# Patient Record
Sex: Female | Born: 1975
Health system: Southern US, Community
[De-identification: ages and names within clinical notes are randomized; demographics above are authoritative.]

## PROBLEM LIST (undated history)

## (undated) DIAGNOSIS — J449 Chronic obstructive pulmonary disease, unspecified: Secondary | ICD-10-CM

## (undated) DIAGNOSIS — G8929 Other chronic pain: Secondary | ICD-10-CM

## (undated) DIAGNOSIS — E7800 Pure hypercholesterolemia, unspecified: Secondary | ICD-10-CM

## (undated) DIAGNOSIS — F319 Bipolar disorder, unspecified: Secondary | ICD-10-CM

## (undated) DIAGNOSIS — M5430 Sciatica, unspecified side: Secondary | ICD-10-CM

## (undated) DIAGNOSIS — M797 Fibromyalgia: Secondary | ICD-10-CM

## (undated) DIAGNOSIS — M549 Dorsalgia, unspecified: Secondary | ICD-10-CM

## (undated) DIAGNOSIS — F419 Anxiety disorder, unspecified: Secondary | ICD-10-CM

## (undated) DIAGNOSIS — F32A Depression, unspecified: Secondary | ICD-10-CM

## (undated) DIAGNOSIS — R569 Unspecified convulsions: Secondary | ICD-10-CM

## (undated) DIAGNOSIS — F329 Major depressive disorder, single episode, unspecified: Secondary | ICD-10-CM

## (undated) DIAGNOSIS — E119 Type 2 diabetes mellitus without complications: Secondary | ICD-10-CM

## (undated) DIAGNOSIS — M25559 Pain in unspecified hip: Secondary | ICD-10-CM

## (undated) DIAGNOSIS — I10 Essential (primary) hypertension: Secondary | ICD-10-CM

## (undated) DIAGNOSIS — I639 Cerebral infarction, unspecified: Secondary | ICD-10-CM

## (undated) DIAGNOSIS — J189 Pneumonia, unspecified organism: Secondary | ICD-10-CM

## (undated) DIAGNOSIS — M542 Cervicalgia: Secondary | ICD-10-CM

## (undated) DIAGNOSIS — F909 Attention-deficit hyperactivity disorder, unspecified type: Secondary | ICD-10-CM

## (undated) DIAGNOSIS — M1990 Unspecified osteoarthritis, unspecified site: Secondary | ICD-10-CM

## (undated) DIAGNOSIS — IMO0002 Reserved for concepts with insufficient information to code with codable children: Secondary | ICD-10-CM

## (undated) DIAGNOSIS — R51 Headache: Secondary | ICD-10-CM

## (undated) DIAGNOSIS — F4310 Post-traumatic stress disorder, unspecified: Secondary | ICD-10-CM

## (undated) HISTORY — PX: OTHER SURGICAL HISTORY: SHX169

## (undated) HISTORY — PX: ABDOMINAL HYSTERECTOMY: SHX81

## (undated) HISTORY — PX: ANTERIOR CRUCIATE LIGAMENT REPAIR: SHX115

## (undated) HISTORY — DX: Post-traumatic stress disorder, unspecified: F43.10

## (undated) HISTORY — PX: LEG SURGERY: SHX1003

## (undated) HISTORY — DX: Reserved for concepts with insufficient information to code with codable children: IMO0002

## (undated) HISTORY — DX: Sciatica, unspecified side: M54.30

## (undated) HISTORY — DX: Depression, unspecified: F32.A

## (undated) HISTORY — DX: Anxiety disorder, unspecified: F41.9

## (undated) HISTORY — DX: Major depressive disorder, single episode, unspecified: F32.9

---

## 1997-06-03 ENCOUNTER — Emergency Department (HOSPITAL_COMMUNITY): Admission: EM | Admit: 1997-06-03 | Discharge: 1997-06-03 | Payer: Self-pay | Admitting: Emergency Medicine

## 1997-10-26 ENCOUNTER — Emergency Department (HOSPITAL_COMMUNITY): Admission: EM | Admit: 1997-10-26 | Discharge: 1997-10-26 | Payer: Self-pay | Admitting: Emergency Medicine

## 1997-12-20 ENCOUNTER — Inpatient Hospital Stay (HOSPITAL_COMMUNITY): Admission: EM | Admit: 1997-12-20 | Discharge: 1997-12-21 | Payer: Self-pay | Admitting: Emergency Medicine

## 1998-03-16 ENCOUNTER — Inpatient Hospital Stay (HOSPITAL_COMMUNITY): Admission: AD | Admit: 1998-03-16 | Discharge: 1998-03-16 | Payer: Self-pay | Admitting: Obstetrics

## 1998-03-17 ENCOUNTER — Inpatient Hospital Stay (HOSPITAL_COMMUNITY): Admission: AD | Admit: 1998-03-17 | Discharge: 1998-03-17 | Payer: Self-pay | Admitting: *Deleted

## 1998-04-11 ENCOUNTER — Emergency Department (HOSPITAL_COMMUNITY): Admission: EM | Admit: 1998-04-11 | Discharge: 1998-04-11 | Payer: Self-pay | Admitting: Emergency Medicine

## 1998-06-27 ENCOUNTER — Inpatient Hospital Stay (HOSPITAL_COMMUNITY): Admission: AD | Admit: 1998-06-27 | Discharge: 1998-06-27 | Payer: Self-pay | Admitting: Obstetrics

## 1998-08-14 ENCOUNTER — Inpatient Hospital Stay (HOSPITAL_COMMUNITY): Admission: AD | Admit: 1998-08-14 | Discharge: 1998-08-19 | Payer: Self-pay | Admitting: *Deleted

## 1998-08-17 ENCOUNTER — Encounter: Payer: Self-pay | Admitting: *Deleted

## 1999-12-02 ENCOUNTER — Emergency Department (HOSPITAL_COMMUNITY): Admission: EM | Admit: 1999-12-02 | Discharge: 1999-12-02 | Payer: Self-pay | Admitting: Emergency Medicine

## 2000-05-27 ENCOUNTER — Emergency Department (HOSPITAL_COMMUNITY): Admission: EM | Admit: 2000-05-27 | Discharge: 2000-05-27 | Payer: Self-pay | Admitting: Emergency Medicine

## 2000-05-27 ENCOUNTER — Encounter: Payer: Self-pay | Admitting: Emergency Medicine

## 2001-04-22 ENCOUNTER — Emergency Department (HOSPITAL_COMMUNITY): Admission: EM | Admit: 2001-04-22 | Discharge: 2001-04-22 | Payer: Self-pay | Admitting: Emergency Medicine

## 2001-06-02 ENCOUNTER — Encounter: Payer: Self-pay | Admitting: Emergency Medicine

## 2001-06-02 ENCOUNTER — Emergency Department (HOSPITAL_COMMUNITY): Admission: EM | Admit: 2001-06-02 | Discharge: 2001-06-02 | Payer: Self-pay | Admitting: Emergency Medicine

## 2001-06-02 ENCOUNTER — Ambulatory Visit (HOSPITAL_COMMUNITY): Admission: RE | Admit: 2001-06-02 | Discharge: 2001-06-02 | Payer: Self-pay | Admitting: Pediatrics

## 2001-07-12 ENCOUNTER — Encounter: Payer: Self-pay | Admitting: Emergency Medicine

## 2001-07-12 ENCOUNTER — Emergency Department (HOSPITAL_COMMUNITY): Admission: EM | Admit: 2001-07-12 | Discharge: 2001-07-12 | Payer: Self-pay | Admitting: Emergency Medicine

## 2001-07-13 ENCOUNTER — Emergency Department (HOSPITAL_COMMUNITY): Admission: EM | Admit: 2001-07-13 | Discharge: 2001-07-13 | Payer: Self-pay | Admitting: *Deleted

## 2001-08-10 ENCOUNTER — Inpatient Hospital Stay (HOSPITAL_COMMUNITY): Admission: EM | Admit: 2001-08-10 | Discharge: 2001-08-16 | Payer: Self-pay | Admitting: Psychiatry

## 2001-09-12 ENCOUNTER — Encounter: Payer: Self-pay | Admitting: *Deleted

## 2001-09-12 ENCOUNTER — Emergency Department (HOSPITAL_COMMUNITY): Admission: EM | Admit: 2001-09-12 | Discharge: 2001-09-12 | Payer: Self-pay | Admitting: *Deleted

## 2001-09-26 ENCOUNTER — Encounter: Payer: Self-pay | Admitting: *Deleted

## 2001-09-26 ENCOUNTER — Emergency Department (HOSPITAL_COMMUNITY): Admission: EM | Admit: 2001-09-26 | Discharge: 2001-09-26 | Payer: Self-pay | Admitting: *Deleted

## 2001-10-06 ENCOUNTER — Encounter: Payer: Self-pay | Admitting: *Deleted

## 2001-10-06 ENCOUNTER — Emergency Department (HOSPITAL_COMMUNITY): Admission: EM | Admit: 2001-10-06 | Discharge: 2001-10-06 | Payer: Self-pay | Admitting: *Deleted

## 2001-12-26 ENCOUNTER — Emergency Department (HOSPITAL_COMMUNITY): Admission: EM | Admit: 2001-12-26 | Discharge: 2001-12-26 | Payer: Self-pay | Admitting: *Deleted

## 2001-12-26 ENCOUNTER — Encounter: Payer: Self-pay | Admitting: *Deleted

## 2002-01-01 ENCOUNTER — Inpatient Hospital Stay (HOSPITAL_COMMUNITY): Admission: EM | Admit: 2002-01-01 | Discharge: 2002-01-03 | Payer: Self-pay | Admitting: Psychiatry

## 2002-01-18 ENCOUNTER — Emergency Department (HOSPITAL_COMMUNITY): Admission: EM | Admit: 2002-01-18 | Discharge: 2002-01-18 | Payer: Self-pay | Admitting: *Deleted

## 2002-01-18 ENCOUNTER — Encounter: Payer: Self-pay | Admitting: *Deleted

## 2002-01-21 ENCOUNTER — Emergency Department (HOSPITAL_COMMUNITY): Admission: EM | Admit: 2002-01-21 | Discharge: 2002-01-21 | Payer: Self-pay | Admitting: Emergency Medicine

## 2002-02-10 ENCOUNTER — Emergency Department (HOSPITAL_COMMUNITY): Admission: EM | Admit: 2002-02-10 | Discharge: 2002-02-10 | Payer: Self-pay | Admitting: Internal Medicine

## 2002-03-21 ENCOUNTER — Emergency Department (HOSPITAL_COMMUNITY): Admission: EM | Admit: 2002-03-21 | Discharge: 2002-03-21 | Payer: Self-pay | Admitting: Emergency Medicine

## 2002-05-12 ENCOUNTER — Encounter: Payer: Self-pay | Admitting: *Deleted

## 2002-05-12 ENCOUNTER — Emergency Department (HOSPITAL_COMMUNITY): Admission: EM | Admit: 2002-05-12 | Discharge: 2002-05-12 | Payer: Self-pay | Admitting: *Deleted

## 2002-05-22 ENCOUNTER — Emergency Department (HOSPITAL_COMMUNITY): Admission: EM | Admit: 2002-05-22 | Discharge: 2002-05-23 | Payer: Self-pay | Admitting: *Deleted

## 2002-06-04 ENCOUNTER — Encounter: Payer: Self-pay | Admitting: *Deleted

## 2002-06-04 ENCOUNTER — Emergency Department (HOSPITAL_COMMUNITY): Admission: EM | Admit: 2002-06-04 | Discharge: 2002-06-04 | Payer: Self-pay | Admitting: *Deleted

## 2002-06-30 ENCOUNTER — Emergency Department (HOSPITAL_COMMUNITY): Admission: EM | Admit: 2002-06-30 | Discharge: 2002-06-30 | Payer: Self-pay | Admitting: Emergency Medicine

## 2002-07-14 ENCOUNTER — Emergency Department (HOSPITAL_COMMUNITY): Admission: EM | Admit: 2002-07-14 | Discharge: 2002-07-14 | Payer: Self-pay | Admitting: Emergency Medicine

## 2002-07-15 ENCOUNTER — Encounter: Payer: Self-pay | Admitting: *Deleted

## 2002-07-25 ENCOUNTER — Emergency Department (HOSPITAL_COMMUNITY): Admission: EM | Admit: 2002-07-25 | Discharge: 2002-07-25 | Payer: Self-pay | Admitting: Emergency Medicine

## 2002-07-29 ENCOUNTER — Encounter: Payer: Self-pay | Admitting: General Surgery

## 2002-07-29 ENCOUNTER — Encounter (HOSPITAL_COMMUNITY): Admission: RE | Admit: 2002-07-29 | Discharge: 2002-08-28 | Payer: Self-pay | Admitting: General Surgery

## 2002-09-07 ENCOUNTER — Inpatient Hospital Stay (HOSPITAL_COMMUNITY): Admission: EM | Admit: 2002-09-07 | Discharge: 2002-09-12 | Payer: Self-pay | Admitting: Psychiatry

## 2002-12-31 ENCOUNTER — Emergency Department (HOSPITAL_COMMUNITY): Admission: EM | Admit: 2002-12-31 | Discharge: 2002-12-31 | Payer: Self-pay | Admitting: Internal Medicine

## 2003-01-20 ENCOUNTER — Emergency Department (HOSPITAL_COMMUNITY): Admission: EM | Admit: 2003-01-20 | Discharge: 2003-01-20 | Payer: Self-pay | Admitting: Emergency Medicine

## 2003-02-22 ENCOUNTER — Emergency Department (HOSPITAL_COMMUNITY): Admission: EM | Admit: 2003-02-22 | Discharge: 2003-02-22 | Payer: Self-pay | Admitting: Emergency Medicine

## 2003-03-03 ENCOUNTER — Observation Stay (HOSPITAL_COMMUNITY): Admission: EM | Admit: 2003-03-03 | Discharge: 2003-03-03 | Payer: Self-pay | Admitting: Internal Medicine

## 2003-09-27 ENCOUNTER — Emergency Department (HOSPITAL_COMMUNITY): Admission: EM | Admit: 2003-09-27 | Discharge: 2003-09-27 | Payer: Self-pay | Admitting: Emergency Medicine

## 2003-11-04 ENCOUNTER — Inpatient Hospital Stay (HOSPITAL_COMMUNITY): Admission: AD | Admit: 2003-11-04 | Discharge: 2003-11-13 | Payer: Self-pay | Admitting: Psychiatry

## 2003-11-04 ENCOUNTER — Ambulatory Visit: Payer: Self-pay | Admitting: Psychiatry

## 2003-12-15 ENCOUNTER — Emergency Department (HOSPITAL_COMMUNITY): Admission: EM | Admit: 2003-12-15 | Discharge: 2003-12-15 | Payer: Self-pay | Admitting: Emergency Medicine

## 2004-02-07 ENCOUNTER — Emergency Department (HOSPITAL_COMMUNITY): Admission: EM | Admit: 2004-02-07 | Discharge: 2004-02-08 | Payer: Self-pay | Admitting: Emergency Medicine

## 2004-02-25 ENCOUNTER — Emergency Department (HOSPITAL_COMMUNITY): Admission: EM | Admit: 2004-02-25 | Discharge: 2004-02-25 | Payer: Self-pay | Admitting: Emergency Medicine

## 2004-03-25 ENCOUNTER — Ambulatory Visit: Payer: Self-pay | Admitting: Psychiatry

## 2004-03-25 ENCOUNTER — Inpatient Hospital Stay (HOSPITAL_COMMUNITY): Admission: RE | Admit: 2004-03-25 | Discharge: 2004-04-02 | Payer: Self-pay | Admitting: Psychiatry

## 2004-06-13 ENCOUNTER — Emergency Department (HOSPITAL_COMMUNITY): Admission: EM | Admit: 2004-06-13 | Discharge: 2004-06-13 | Payer: Self-pay | Admitting: Emergency Medicine

## 2004-10-05 ENCOUNTER — Inpatient Hospital Stay (HOSPITAL_COMMUNITY): Admission: EM | Admit: 2004-10-05 | Discharge: 2004-10-06 | Payer: Self-pay | Admitting: Emergency Medicine

## 2004-12-17 ENCOUNTER — Emergency Department (HOSPITAL_COMMUNITY): Admission: EM | Admit: 2004-12-17 | Discharge: 2004-12-17 | Payer: Self-pay | Admitting: Emergency Medicine

## 2004-12-24 ENCOUNTER — Ambulatory Visit: Payer: Self-pay | Admitting: Family Medicine

## 2004-12-24 ENCOUNTER — Emergency Department (HOSPITAL_COMMUNITY): Admission: EM | Admit: 2004-12-24 | Discharge: 2004-12-24 | Payer: Self-pay | Admitting: Emergency Medicine

## 2005-01-02 ENCOUNTER — Ambulatory Visit: Payer: Self-pay | Admitting: Family Medicine

## 2005-01-06 ENCOUNTER — Ambulatory Visit (HOSPITAL_COMMUNITY): Admission: RE | Admit: 2005-01-06 | Discharge: 2005-01-06 | Payer: Self-pay | Admitting: Family Medicine

## 2005-01-21 ENCOUNTER — Encounter (HOSPITAL_COMMUNITY): Admission: RE | Admit: 2005-01-21 | Discharge: 2005-02-20 | Payer: Self-pay | Admitting: Family Medicine

## 2005-02-04 ENCOUNTER — Ambulatory Visit: Payer: Self-pay | Admitting: Family Medicine

## 2005-02-07 ENCOUNTER — Ambulatory Visit (HOSPITAL_COMMUNITY): Admission: RE | Admit: 2005-02-07 | Discharge: 2005-02-07 | Payer: Self-pay | Admitting: Family Medicine

## 2005-02-07 ENCOUNTER — Ambulatory Visit (HOSPITAL_COMMUNITY): Admission: RE | Admit: 2005-02-07 | Discharge: 2005-02-07 | Payer: Self-pay | Admitting: Neurology

## 2005-03-13 ENCOUNTER — Ambulatory Visit (HOSPITAL_COMMUNITY): Admission: RE | Admit: 2005-03-13 | Discharge: 2005-03-13 | Payer: Self-pay | Admitting: Interventional Radiology

## 2005-05-26 ENCOUNTER — Emergency Department (HOSPITAL_COMMUNITY): Admission: EM | Admit: 2005-05-26 | Discharge: 2005-05-27 | Payer: Self-pay | Admitting: Emergency Medicine

## 2005-07-22 ENCOUNTER — Emergency Department (HOSPITAL_COMMUNITY): Admission: EM | Admit: 2005-07-22 | Discharge: 2005-07-22 | Payer: Self-pay | Admitting: Emergency Medicine

## 2005-09-25 ENCOUNTER — Ambulatory Visit: Payer: Self-pay | Admitting: Family Medicine

## 2005-10-23 ENCOUNTER — Emergency Department (HOSPITAL_COMMUNITY): Admission: EM | Admit: 2005-10-23 | Discharge: 2005-10-23 | Payer: Self-pay | Admitting: Emergency Medicine

## 2005-11-18 ENCOUNTER — Emergency Department (HOSPITAL_COMMUNITY): Admission: EM | Admit: 2005-11-18 | Discharge: 2005-11-18 | Payer: Self-pay | Admitting: Emergency Medicine

## 2005-11-20 ENCOUNTER — Ambulatory Visit: Payer: Self-pay | Admitting: *Deleted

## 2005-11-20 ENCOUNTER — Inpatient Hospital Stay (HOSPITAL_COMMUNITY): Admission: AD | Admit: 2005-11-20 | Discharge: 2005-12-01 | Payer: Self-pay | Admitting: *Deleted

## 2005-11-27 ENCOUNTER — Encounter: Payer: Self-pay | Admitting: *Deleted

## 2006-05-20 ENCOUNTER — Emergency Department (HOSPITAL_COMMUNITY): Admission: EM | Admit: 2006-05-20 | Discharge: 2006-05-20 | Payer: Self-pay | Admitting: Emergency Medicine

## 2006-06-07 ENCOUNTER — Emergency Department (HOSPITAL_COMMUNITY): Admission: EM | Admit: 2006-06-07 | Discharge: 2006-06-08 | Payer: Self-pay | Admitting: Emergency Medicine

## 2006-06-21 ENCOUNTER — Emergency Department (HOSPITAL_COMMUNITY): Admission: EM | Admit: 2006-06-21 | Discharge: 2006-06-21 | Payer: Self-pay | Admitting: Emergency Medicine

## 2007-03-10 ENCOUNTER — Emergency Department (HOSPITAL_COMMUNITY): Admission: EM | Admit: 2007-03-10 | Discharge: 2007-03-10 | Payer: Self-pay | Admitting: Emergency Medicine

## 2007-04-11 ENCOUNTER — Emergency Department (HOSPITAL_COMMUNITY): Admission: EM | Admit: 2007-04-11 | Discharge: 2007-04-11 | Payer: Self-pay | Admitting: Emergency Medicine

## 2007-05-29 ENCOUNTER — Emergency Department (HOSPITAL_COMMUNITY): Admission: EM | Admit: 2007-05-29 | Discharge: 2007-05-29 | Payer: Self-pay | Admitting: Emergency Medicine

## 2007-08-09 ENCOUNTER — Emergency Department (HOSPITAL_COMMUNITY): Admission: EM | Admit: 2007-08-09 | Discharge: 2007-08-09 | Payer: Self-pay | Admitting: Emergency Medicine

## 2007-10-13 ENCOUNTER — Emergency Department (HOSPITAL_COMMUNITY): Admission: EM | Admit: 2007-10-13 | Discharge: 2007-10-13 | Payer: Self-pay | Admitting: Emergency Medicine

## 2008-04-29 ENCOUNTER — Emergency Department (HOSPITAL_COMMUNITY): Admission: EM | Admit: 2008-04-29 | Discharge: 2008-04-29 | Payer: Self-pay | Admitting: Emergency Medicine

## 2008-06-06 ENCOUNTER — Emergency Department (HOSPITAL_COMMUNITY): Admission: EM | Admit: 2008-06-06 | Discharge: 2008-06-07 | Payer: Self-pay | Admitting: Emergency Medicine

## 2008-10-18 ENCOUNTER — Ambulatory Visit (HOSPITAL_COMMUNITY): Admission: RE | Admit: 2008-10-18 | Discharge: 2008-10-18 | Payer: Self-pay | Admitting: Neurology

## 2008-10-23 ENCOUNTER — Other Ambulatory Visit: Payer: Self-pay | Admitting: Emergency Medicine

## 2008-10-23 ENCOUNTER — Ambulatory Visit: Payer: Self-pay | Admitting: Psychiatry

## 2008-10-24 ENCOUNTER — Inpatient Hospital Stay (HOSPITAL_COMMUNITY): Admission: RE | Admit: 2008-10-24 | Discharge: 2008-10-30 | Payer: Self-pay | Admitting: Psychiatry

## 2008-10-26 ENCOUNTER — Encounter: Payer: Self-pay | Admitting: Emergency Medicine

## 2008-11-15 ENCOUNTER — Emergency Department (HOSPITAL_COMMUNITY): Admission: EM | Admit: 2008-11-15 | Discharge: 2008-11-15 | Payer: Self-pay | Admitting: Emergency Medicine

## 2009-01-04 ENCOUNTER — Encounter (INDEPENDENT_AMBULATORY_CARE_PROVIDER_SITE_OTHER): Payer: Self-pay | Admitting: *Deleted

## 2009-01-15 ENCOUNTER — Emergency Department (HOSPITAL_COMMUNITY): Admission: EM | Admit: 2009-01-15 | Discharge: 2009-01-15 | Payer: Self-pay | Admitting: Emergency Medicine

## 2009-02-18 ENCOUNTER — Other Ambulatory Visit: Payer: Self-pay

## 2009-02-18 ENCOUNTER — Inpatient Hospital Stay (HOSPITAL_COMMUNITY): Admission: AD | Admit: 2009-02-18 | Discharge: 2009-02-27 | Payer: Self-pay | Admitting: Psychiatry

## 2009-02-18 ENCOUNTER — Ambulatory Visit: Payer: Self-pay | Admitting: Psychiatry

## 2009-03-10 ENCOUNTER — Emergency Department (HOSPITAL_COMMUNITY): Admission: EM | Admit: 2009-03-10 | Discharge: 2009-03-10 | Payer: Self-pay | Admitting: Emergency Medicine

## 2009-03-15 ENCOUNTER — Emergency Department (HOSPITAL_COMMUNITY): Admission: EM | Admit: 2009-03-15 | Discharge: 2009-03-15 | Payer: Self-pay | Admitting: Emergency Medicine

## 2009-12-02 ENCOUNTER — Emergency Department (HOSPITAL_COMMUNITY): Admission: EM | Admit: 2009-12-02 | Discharge: 2009-12-03 | Payer: Self-pay | Admitting: Emergency Medicine

## 2010-03-01 ENCOUNTER — Emergency Department (HOSPITAL_COMMUNITY)
Admission: EM | Admit: 2010-03-01 | Discharge: 2010-03-01 | Payer: Self-pay | Source: Home / Self Care | Admitting: Emergency Medicine

## 2010-03-23 ENCOUNTER — Encounter: Payer: Self-pay | Admitting: Family Medicine

## 2010-03-24 ENCOUNTER — Encounter: Payer: Self-pay | Admitting: Interventional Radiology

## 2010-03-24 ENCOUNTER — Encounter: Payer: Self-pay | Admitting: Neurology

## 2010-05-13 LAB — URINALYSIS, ROUTINE W REFLEX MICROSCOPIC
Glucose, UA: NEGATIVE mg/dL
Hgb urine dipstick: NEGATIVE
Specific Gravity, Urine: >1.03 — ABNORMAL HIGH (ref 1.005–1.030)
Urobilinogen, UA: 0.2 mg/dL (ref 0.0–1.0)

## 2010-05-15 LAB — URINALYSIS, ROUTINE W REFLEX MICROSCOPIC
Bilirubin Urine: NEGATIVE
Glucose, UA: NEGATIVE mg/dL
Ketones, ur: NEGATIVE mg/dL
Nitrite: NEGATIVE
Protein, ur: NEGATIVE mg/dL
Urobilinogen, UA: 0.2 mg/dL (ref 0.0–1.0)
pH: 5.5 (ref 5.0–8.0)

## 2010-06-03 LAB — HEPATIC FUNCTION PANEL
AST: 22 U/L (ref 0–37)
Alkaline Phosphatase: 62 U/L (ref 39–117)
Bilirubin, Direct: 0.1 mg/dL (ref 0.0–0.3)
Bilirubin, Direct: 0.1 mg/dL (ref 0.0–0.3)
Indirect Bilirubin: 0.2 mg/dL — ABNORMAL LOW (ref 0.3–0.9)
Indirect Bilirubin: 0.4 mg/dL (ref 0.3–0.9)
Total Bilirubin: 0.3 mg/dL (ref 0.3–1.2)
Total Bilirubin: 0.5 mg/dL (ref 0.3–1.2)

## 2010-06-03 LAB — BASIC METABOLIC PANEL
BUN: 5 mg/dL — ABNORMAL LOW (ref 6–23)
BUN: 10 mg/dL (ref 6–23)
CO2: 28 mEq/L (ref 19–32)
Calcium: 9.8 mg/dL (ref 8.4–10.5)
Chloride: 103 mEq/L (ref 96–112)
Creatinine, Ser: 0.81 mg/dL (ref 0.4–1.2)
GFR calc Af Amer: >60 mL/min (ref >60)
GFR calc Af Amer: >60 mL/min (ref >60)
GFR calc non Af Amer: 55 mL/min — ABNORMAL LOW (ref >60)
Glucose, Bld: 92 mg/dL (ref 70–99)
Glucose, Bld: 96 mg/dL (ref 70–99)
Sodium: 136 mEq/L (ref 135–145)

## 2010-06-03 LAB — DIFFERENTIAL
Lymphs Abs: 3.1 10*3/uL (ref 0.7–4.0)
Monocytes Absolute: 0.5 10*3/uL (ref 0.1–1.0)
Neutro Abs: 4.6 10*3/uL (ref 1.7–7.7)
Neutrophils Relative %: 56 % (ref 43–77)

## 2010-06-03 LAB — CBC
HCT: 43.6 % (ref 36.0–46.0)
Hemoglobin: 14.9 g/dL (ref 12.0–15.0)
Platelets: 196 10*3/uL (ref 150–400)
RBC: 4.66 MIL/uL (ref 3.87–5.11)
WBC: 8.3 10*3/uL (ref 4.0–10.5)

## 2010-06-03 LAB — URINALYSIS, ROUTINE W REFLEX MICROSCOPIC
Bilirubin Urine: NEGATIVE
Leukocytes, UA: NEGATIVE
Protein, ur: NEGATIVE mg/dL
Urobilinogen, UA: 0.2 mg/dL (ref 0.0–1.0)

## 2010-06-03 LAB — URINE MICROSCOPIC-ADD ON

## 2010-06-03 LAB — VALPROIC ACID LEVEL: Valproic Acid Lvl: 81.3 ug/mL (ref 50.0–100.0)

## 2010-06-03 LAB — RAPID URINE DRUG SCREEN, HOSP PERFORMED
Amphetamines: POSITIVE — AB
Benzodiazepines: POSITIVE — AB

## 2010-06-03 LAB — PREGNANCY, URINE: Preg Test, Ur: NEGATIVE

## 2010-06-08 LAB — BASIC METABOLIC PANEL
BUN: 8 mg/dL (ref 6–23)
CO2: 27 mEq/L (ref 19–32)
Chloride: 107 mEq/L (ref 96–112)
GFR calc non Af Amer: >60 mL/min (ref >60)
Glucose, Bld: 82 mg/dL (ref 70–99)
Potassium: 4.2 mEq/L (ref 3.5–5.1)
Sodium: 142 mEq/L (ref 135–145)

## 2010-06-08 LAB — DRUGS OF ABUSE SCREEN W/O ALC, ROUTINE URINE
Amphetamine Screen, Ur: NEGATIVE
Benzodiazepines.: POSITIVE — AB
Cocaine Metabolites: NEGATIVE
Creatinine,U: 149 mg/dL
Opiate Screen, Urine: NEGATIVE
Propoxyphene: POSITIVE — AB

## 2010-06-08 LAB — DIFFERENTIAL
Basophils Absolute: 0 10*3/uL (ref 0.0–0.1)
Eosinophils Absolute: 0.1 10*3/uL (ref 0.0–0.7)
Eosinophils Relative: 1 % (ref 0–5)
Lymphocytes Relative: 28 % (ref 12–46)
Monocytes Absolute: 0.3 10*3/uL (ref 0.1–1.0)

## 2010-06-08 LAB — CBC
HCT: 42.8 % (ref 36.0–46.0)
Hemoglobin: 14.9 g/dL (ref 12.0–15.0)
MCV: 94.5 fL (ref 78.0–100.0)
Platelets: 162 10*3/uL (ref 150–400)
RDW: 13.2 % (ref 11.5–15.5)

## 2010-06-08 LAB — BENZODIAZEPINE, QUANTITATIVE, URINE: Oxazepam GC/MS Conf: NEGATIVE

## 2010-06-08 LAB — RAPID URINE DRUG SCREEN, HOSP PERFORMED
Barbiturates: NOT DETECTED
Cocaine: POSITIVE — AB
Opiates: NOT DETECTED
Tetrahydrocannabinol: POSITIVE — AB

## 2010-06-08 LAB — PROPOXYPHENE, CONFIRMATION: Propoxyphene Metabolite: >10000 ng/mL

## 2010-06-12 LAB — URINALYSIS, ROUTINE W REFLEX MICROSCOPIC
Bilirubin Urine: NEGATIVE
Glucose, UA: NEGATIVE mg/dL
Hgb urine dipstick: NEGATIVE
Nitrite: NEGATIVE
Specific Gravity, Urine: >1.03 — ABNORMAL HIGH (ref 1.005–1.030)
pH: 5.5 (ref 5.0–8.0)

## 2010-06-12 LAB — RAPID URINE DRUG SCREEN, HOSP PERFORMED
Benzodiazepines: POSITIVE — AB
Cocaine: POSITIVE — AB
Opiates: POSITIVE — AB
Tetrahydrocannabinol: POSITIVE — AB

## 2010-06-12 LAB — BASIC METABOLIC PANEL
Calcium: 9.2 mg/dL (ref 8.4–10.5)
GFR calc Af Amer: >60 mL/min (ref >60)
GFR calc non Af Amer: >60 mL/min (ref >60)
Potassium: 3.8 mEq/L (ref 3.5–5.1)
Sodium: 139 mEq/L (ref 135–145)

## 2010-06-12 LAB — DIFFERENTIAL
Basophils Absolute: 0 10*3/uL (ref 0.0–0.1)
Lymphocytes Relative: 41 % (ref 12–46)
Lymphs Abs: 3.4 10*3/uL (ref 0.7–4.0)
Monocytes Absolute: 0.3 10*3/uL (ref 0.1–1.0)
Monocytes Relative: 4 % (ref 3–12)
Neutro Abs: 4.3 10*3/uL (ref 1.7–7.7)

## 2010-06-12 LAB — CBC
HCT: 39.8 % (ref 36.0–46.0)
Hemoglobin: 13.8 g/dL (ref 12.0–15.0)
RBC: 4.35 MIL/uL (ref 3.87–5.11)

## 2010-07-16 NOTE — H&P (Signed)
Latasha [name], Latasha [name] NO.:  [id number]   MEDICAL RECORD NO.:  [id number]          PATIENT TYPE:  IPS   LOCATION:  0508                          FACILITY:  [facility]   PHYSICIAN:  Anselm Jungling, MD  DATE OF BIRTH:  15-Jan-1976   DATE OF ADMISSION:  10/24/2008  DATE OF DISCHARGE:                       PSYCHIATRIC ADMISSION ASSESSMENT   HISTORY OF PRESENT ILLNESS:  This is a voluntary admission to the  services of Dr. Geralyn Flash; today's date is October 25, 2008.  This  is a 35 year old separated white female who presented to the emergency  department at [facility].  She reported that she was bipolar that she  had not been on her medications since discharged from [place] in  March.  She stated that her insurance plan did not cover her  medications, then she also stated at her followup at a [facility] she was  only given one month's worth of prescription at discharge and [facility]  could not see her for 3 months, so she decided why should I bother  taking these meds.  She states she is here because she cannot feel or  cope with her past.  She would like to learn coping skills.  She does  not understand why she is still showing positive for cocaine a she  states her last use was 7-10 days ago.  She is well known to use  marijuana and cocaine.  She states that she was given Social Security  disability for her mental illness back in 2004.   PAST PSYCHIATRIC HISTORY:  She reports inpatient stay is here at the  [facility] in 06/2003 and [id number].  She went to residential  rehab [facility] for crack addiction and 2005.  She used to see the  psychiatrist Dr. Curly Shores up until about 2 years ago when his practice  site changed.   SOCIAL HISTORY:  She went to the tenth grade.  She has been married  once.  She is separated.  She has three children, a daughter 89, a  daughter 68 and a son 63.  They stay with her mother who is her payee  you for her social security disability  check.   FAMILY HISTORY:  She reports that everybody in her family, even her  children, have mental illness and alcohol and drug history.  She began  using marijuana and alcohol as a teenager and she uses marijuana daily.  She also frequently uses alcohol and occasionally cocaine according to  her.  Her primary care Naelani Lafrance, she does not actually have one; she  reports that she has recently seen a Dr. Gerilyn Pilgrim in [place] for her  seizures and her migraines.  She is also known to have asthma and  osteoarthritis.   MEDICATIONS:  She reported being prescribed:  1. Neurontin 800 mg q.i.d.  2. Ventolin 2 puffs q.6 h.  3. Darvocet N 100 two tablets p.o. t.i.d.  4. Black cohosh 2 tablets p.o. the daily.   ALLERGIES:  1. KEFLEX.  2. ZOFRAN.  3. TORADOL.  4. ABILIFY.   POSITIVE PHYSICAL FINDINGS:  VITAL SIGNS:  She was medically cleared in  the ED at [facility].  Her vital signs showed that her temperature was  97.6, pulse was 73-90.  Her respirations were 28-22, blood pressure was  106/68 to 126/68.  Her UDS was positive for marijuana and cocaine.  Her  CBC and her electrolyte had no abnormalities.  She did not have any  alcohol.  GENERAL:  She appears to be her stated age of 37.  She looks like she  has had a hard life, but she had no particular significant positives on  her review of systems.   MENTAL STATUS EXAM:  She was drowsy.  She was given Geodon at 1 o'clock.  She reports that she revealed something in group and it upset her.  She  is casually groomed and dressed in her own clothing and her speech is  not pressured.  Her mood is depressed with some anxiety.  Her thought  processes are clear, rational and goal oriented.  She knows she needs to  get back on her meds.  Judgment and insight are fair.  Concentration and  memory are intact.  Intelligence is average.  She is not actively  suicidal or homicidal.  She is not having any auditory or visual  hallucinations.    DIAGNOSES:  AXIS I:  Bipolar 1, most recent episode depressed due to  noncompliance.  She also is abusing cocaine and marijuana and she also  reports alcohol abuse, although that was not indicated on her labs.   AXIS II:  Borderline multiple suicide attempts in the past.   AXIS III:  Arthritis, osteoarthritis, seizure disorder of unknown  nature, migraines, and a history for asthma.   AXIS IV:  Severe.   AXIS V:  25.   PLAN:  Admit for safety and stabilization.  Meds will be adjusted.  Towards that end Dr. Dub Mikes had already given her Depakote ER 500 mg p.o.  b.i.d., Zoloft 50 mg p.o. daily, Ativan 1 mg p.o. q.6 h p.r.n., Geodon  60 mg h.s., and her Neurontin 800 mg p.o. q.i.d., and Darvocet N 100 one  to two q.6 h p.r.n. were continued.  She is requesting and that followup  be through the [facility] in [place] as she  has not been able to establish adequate care through [facility] in [place].  Estimated length of stay is 3-5 days.      Mickie Leonarda Salon, P.A.-C.      Anselm Jungling, MD  Electronically Signed    MD/MEDQ  D:  10/24/2008  T:  10/24/2008  Job:  [id number]
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
## 2010-07-19 NOTE — Discharge Summary (Signed)
NAMEDINORA, HEMM NO.:  [id number]   MEDICAL RECORD NO.:  [id number]          PATIENT TYPE:  INP   LOCATION:  A206                          [facility]ILITY[facility]  [facility]   PHYSICIAN:  Calvert Cantor, M.D.     DATE OF BIRTH:  Jul 09, 1975   DATE OF ADMISSION:  10/05/2004  DATE OF DISCHARGE:  LH                                 DISCHARGE SUMMARY   PRIMARY CARE PHYSICIAN:  The patient does not have a primary care physician  and therefore has been assigned to Dr. Ouida Sills to follow up for her history of  dyspnea on exertion.   DISCHARGE DIAGNOSES:  1.  Pneumonitis secondary to cocaine use.  2.  Chronic bronchitis.  3.  History of manic depression.  4.  History of cocaine and marijuana use.   DISCHARGE MEDICATIONS:  1.  Ciprofloxacin 400 mg b.i.d. for acute bronchitis  2.  The patient already takes Trazodone 300 mg a day.   HOSPITAL COURSE:  This is a 35 year old white female, who was admitted to  the hospital secondary to shortness of breath and hypoxia.  The patient did  well without any specific treatment and her pulse-ox improved to the 90s,  where it was in the mid 80s when she was admitted.  This morning, she is  doing well, she is awake and alert, and she is not short of breath.  She has  a cough and is coughing up a scant amount of yellowish sputum.   PHYSICAL EXAMINATION:  LUNGS:  Clear bilaterally.  HEART:  Regular rate and rhythm, no murmurs.  ABDOMEN:  Soft, nontender, nondistended, bowel sounds are positive.  EXTREMITIES:  No cyanosis, clubbing, or edema.   LABORATORY DATA:  Blood work:  ABG done this morning shows a pH of 7.397,  pCO2 39.5, pO2 96.6, and an O2 saturation of 98%.  Her previous ABG done in  the ER yesterday on room air showed a pH of 7.43, pCO2 29, pO2 65, with an  O2 sat of 94.8%.  Because of the A-A gradient, it was repeated this morning  Other blood work:  WBC count, yesterday white count was elevated at 16.4,  now it is 8.5, hemoglobin is 13.2,  hematocrit 37, platelets 165.  Sodium  137, potassium 3.6, chloride 107, bicarb 25, glucose 107, BUN 6, creatinine  0.8.  LFTs were normal.  Cardiac enzymes x3 sets were normal.   Urine drug screen was positive for cocaine.   CAT scan showed mixed ground glass and reticular nodular opacities through  the lungs symmetrically with predominant involvement in the upper lobes.  Lower lobes and right middle lobe are involved to a lesser degree.   The patient received a dose of Levaquin in the ER yesterday and is going to  be continued on ciprofloxacin because she wants a medication that is  affordable since she does not have any insurance.  She does not have a  primary care physician and is therefore being assigned to Dr. Ouida Sills as per  our unassigned chart.  She was evaluated by the Act Team.  She does have  a  psychiatrist, whose name is Dr. Barnie Mort, who she has been following up with as  an outpatient.  She also states that she does not want to continue to live  with her mother, and Mrs. Hyacinth Meeker, the ACT team representative has  recommended [facility] on discharge.  The patient is agreeable to going  there.       SR/MEDQ  D:  10/06/2004  T:  10/06/2004  Job:  [id number]   cc:   Kingsley Callander. Ouida Sills, MD  [address]  [place]  [place] [address]  Fax: [phone]
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
## 2010-07-19 NOTE — Discharge Summary (Signed)
[facility]  Patient:    Latasha, [name] Visit Number: [id number] MRN: [id number]          Service Type: EMS Location: ED Attending Physician:  Rosalyn Charters Dictated by:   Jeanice Lim, M.D. Admit Date:  09/12/2001 Discharge Date: 09/12/2001                             Discharge Summary  IDENTIFYING DATA:  This is a 35 year old separated Caucasian female voluntarily admitted for intentional overdose.  The patient had taken 10 trazodone, reporting it was somewhat of an impulsive act.  ADMISSION MEDICATIONS: 1. Zoloft. 2. Klonopin. 3. Zonegran. 4. Trazodone.  ALLERGIES:  KEFLEX and TORADOL.  PHYSICAL EXAMINATION:  GENERAL:  Essentially within normal limits.  NEUROLOGIC:  Nonfocal.  ROUTINE ADMISSION LABORATORY DATA:  Potassium slightly low at 3.1.  Urine pregnancy test came back positive and repeat serum on two occasions was negative.  Thyroid panel: Within normal limits.  MENTAL STATUS EXAMINATION:  Alert, young middle-aged, cooperative female with good eye contact.  Speech: Clear.  Affect: Anxious and flat.  Thought process: Goal directed.  Thought content: Negative for psychotic symptoms; no auditory or visual hallucinations.  Cognitive: Intact.  Judgment and insight: Fair.  ADMITTING DIAGNOSES: Axis I:    Bipolar disorder, depressed. Axis II:   None. Axis III:  History of bronchitis. Axis IV:   Moderate problems with primary support group. Axis V:    30/65  HOSPITAL COURSE:  The patient was admitted and ordered routine p.r.n. medications, underwent monitoring, individual, group, and milieu treatment. Was titrated on Klonopin and Zoloft, targeting depression and anxiety. Trazodone was optimized to restore sleep and Trileptal was started to stabilize mood.  Abilify was added to target mood lability, agitation, and paranoid ideation.  The patient tolerated medication changes well and her mood gradually stabilized.  CONDITION  AT DISCHARGE:  Improved.  Mood: More euthymic.  Affect: Brighter. Thought process: Goal directed.  Thought content: Negative for dangerous ideation or psychotic symptoms.  DISCHARGE MEDICATIONS: 1. Trileptal 150 mg q.a.m. and two q.h.s. 2. Abilify 10 mg q.h.s. 3. Trazodone 100 mg two q.h.s. 4. Strattera 18 mg q.a.m. 5. Zoloft 100 mg one half q.a.m. 6. Ativan 0.5 mg t.i.d. 7. Lorcet 10/650 mg one q.6-8h. p.r.n. pain; given a one months supply.  FOLLOWUP:  Dr. Demetrios Isaacs on June 24 at 2:30 p.m.  DISCHARGE DIAGNOSES: Axis I:    Bipolar disorder, depressed. Axis II:   None. Axis III:  History of bronchitis. Axis IV:   Moderate problems with primary support group. Axis V:    Global assessment of functioning on discharge was 55-60. Dictated by:   Jeanice Lim, M.D. Attending Physician:  Rosalyn Charters DD:  09/22/01 TD:  09/24/01 Job: [id number]

## 2010-07-21 ENCOUNTER — Emergency Department (HOSPITAL_COMMUNITY)
Admission: EM | Admit: 2010-07-21 | Discharge: 2010-07-21 | Disposition: A | Discharge status: Home / Self Care | Payer: Medicare Other | Attending: Emergency Medicine | Admitting: Emergency Medicine

## 2010-07-21 DIAGNOSIS — M545 Low back pain, unspecified: Secondary | ICD-10-CM | POA: Insufficient documentation

## 2010-08-09 ENCOUNTER — Emergency Department (HOSPITAL_COMMUNITY): Payer: Medicare Other

## 2010-08-09 ENCOUNTER — Emergency Department (HOSPITAL_COMMUNITY)
Admission: EM | Admit: 2010-08-09 | Discharge: 2010-08-10 | Disposition: A | Discharge status: Home / Self Care | Payer: Medicare Other | Attending: Emergency Medicine | Admitting: Emergency Medicine

## 2010-08-09 DIAGNOSIS — X58XXXA Exposure to other specified factors, initial encounter: Secondary | ICD-10-CM | POA: Insufficient documentation

## 2010-08-09 DIAGNOSIS — Y92009 Unspecified place in unspecified non-institutional (private) residence as the place of occurrence of the external cause: Secondary | ICD-10-CM | POA: Insufficient documentation

## 2010-08-09 DIAGNOSIS — S93409A Sprain of unspecified ligament of unspecified ankle, initial encounter: Secondary | ICD-10-CM | POA: Insufficient documentation

## 2010-08-09 DIAGNOSIS — F17200 Nicotine dependence, unspecified, uncomplicated: Secondary | ICD-10-CM | POA: Insufficient documentation

## 2010-08-09 DIAGNOSIS — Y998 Other external cause status: Secondary | ICD-10-CM | POA: Insufficient documentation

## 2010-08-14 ENCOUNTER — Other Ambulatory Visit (HOSPITAL_COMMUNITY): Payer: Self-pay | Admitting: Physical Medicine and Rehabilitation

## 2010-08-14 DIAGNOSIS — R29898 Other symptoms and signs involving the musculoskeletal system: Secondary | ICD-10-CM

## 2010-08-19 ENCOUNTER — Ambulatory Visit (HOSPITAL_COMMUNITY): Payer: Medicare Other

## 2010-08-20 ENCOUNTER — Ambulatory Visit (HOSPITAL_COMMUNITY): Admission: RE | Admit: 2010-08-20 | Payer: Medicare Other | Source: Ambulatory Visit

## 2010-08-22 ENCOUNTER — Ambulatory Visit (HOSPITAL_COMMUNITY): Admission: RE | Admit: 2010-08-22 | Payer: Medicare Other | Source: Ambulatory Visit

## 2010-12-13 ENCOUNTER — Encounter: Payer: Self-pay | Admitting: *Deleted

## 2010-12-13 ENCOUNTER — Emergency Department (HOSPITAL_COMMUNITY): Payer: Medicare Other

## 2010-12-13 ENCOUNTER — Emergency Department (HOSPITAL_COMMUNITY)
Admission: EM | Admit: 2010-12-13 | Discharge: 2010-12-13 | Disposition: A | Discharge status: Home / Self Care | Payer: Medicare Other | Attending: Emergency Medicine | Admitting: Emergency Medicine

## 2010-12-13 DIAGNOSIS — G8929 Other chronic pain: Secondary | ICD-10-CM | POA: Insufficient documentation

## 2010-12-13 DIAGNOSIS — M545 Low back pain, unspecified: Secondary | ICD-10-CM | POA: Insufficient documentation

## 2010-12-13 DIAGNOSIS — S46009A Unspecified injury of muscle(s) and tendon(s) of the rotator cuff of unspecified shoulder, initial encounter: Secondary | ICD-10-CM

## 2010-12-13 DIAGNOSIS — S4980XA Other specified injuries of shoulder and upper arm, unspecified arm, initial encounter: Secondary | ICD-10-CM | POA: Insufficient documentation

## 2010-12-13 DIAGNOSIS — S53409A Unspecified sprain of unspecified elbow, initial encounter: Secondary | ICD-10-CM

## 2010-12-13 DIAGNOSIS — F909 Attention-deficit hyperactivity disorder, unspecified type: Secondary | ICD-10-CM | POA: Insufficient documentation

## 2010-12-13 DIAGNOSIS — S20229A Contusion of unspecified back wall of thorax, initial encounter: Secondary | ICD-10-CM | POA: Insufficient documentation

## 2010-12-13 DIAGNOSIS — S46909A Unspecified injury of unspecified muscle, fascia and tendon at shoulder and upper arm level, unspecified arm, initial encounter: Secondary | ICD-10-CM | POA: Insufficient documentation

## 2010-12-13 DIAGNOSIS — M25559 Pain in unspecified hip: Secondary | ICD-10-CM | POA: Insufficient documentation

## 2010-12-13 DIAGNOSIS — E7800 Pure hypercholesterolemia, unspecified: Secondary | ICD-10-CM | POA: Insufficient documentation

## 2010-12-13 DIAGNOSIS — R569 Unspecified convulsions: Secondary | ICD-10-CM | POA: Insufficient documentation

## 2010-12-13 DIAGNOSIS — W108XXA Fall (on) (from) other stairs and steps, initial encounter: Secondary | ICD-10-CM | POA: Insufficient documentation

## 2010-12-13 DIAGNOSIS — M5416 Radiculopathy, lumbar region: Secondary | ICD-10-CM

## 2010-12-13 DIAGNOSIS — M25519 Pain in unspecified shoulder: Secondary | ICD-10-CM | POA: Insufficient documentation

## 2010-12-13 DIAGNOSIS — S7000XA Contusion of unspecified hip, initial encounter: Secondary | ICD-10-CM | POA: Insufficient documentation

## 2010-12-13 DIAGNOSIS — S300XXA Contusion of lower back and pelvis, initial encounter: Secondary | ICD-10-CM

## 2010-12-13 DIAGNOSIS — F319 Bipolar disorder, unspecified: Secondary | ICD-10-CM | POA: Insufficient documentation

## 2010-12-13 DIAGNOSIS — S7002XA Contusion of left hip, initial encounter: Secondary | ICD-10-CM

## 2010-12-13 DIAGNOSIS — Y92009 Unspecified place in unspecified non-institutional (private) residence as the place of occurrence of the external cause: Secondary | ICD-10-CM | POA: Insufficient documentation

## 2010-12-13 DIAGNOSIS — IMO0002 Reserved for concepts with insufficient information to code with codable children: Secondary | ICD-10-CM | POA: Insufficient documentation

## 2010-12-13 HISTORY — DX: Bipolar disorder, unspecified: F31.9

## 2010-12-13 HISTORY — DX: Unspecified convulsions: R56.9

## 2010-12-13 HISTORY — DX: Attention-deficit hyperactivity disorder, unspecified type: F90.9

## 2010-12-13 HISTORY — DX: Pure hypercholesterolemia, unspecified: E78.00

## 2010-12-13 MED ORDER — OXYCODONE-ACETAMINOPHEN 5-325 MG PO TABS: 2.0000 | ORAL_TABLET | ORAL | Status: AC | PRN

## 2010-12-13 MED ORDER — PREDNISONE 20 MG PO TABS: ORAL_TABLET | ORAL | Status: AC

## 2010-12-13 MED ORDER — OXYCODONE-ACETAMINOPHEN 5-325 MG PO TABS
2.0000 | ORAL_TABLET | Freq: Once | ORAL | Status: AC
  Administered 2010-12-13: 2 via ORAL
  Filled 2010-12-13: qty 2

## 2010-12-13 NOTE — ED Notes (Signed)
Pt c/o fall; pt states she fell down stairs and when she went to catch herself she jerked back her left arm and is c/o pain to left shoulder, right elbow and left hip

## 2010-12-13 NOTE — ED Provider Notes (Signed)
History     CSN: [id number] Arrival date & time: 12/13/2010 12:19 PM  Chief Complaint  Patient presents with  . Fall    (Consider location/radiation/quality/duration/timing/severity/associated sxs/prior treatment) HPI This 35 year old female who fell down the stairs 2 days ago at home, she has a left leg is shorter than the right leg at baseline causing her to trip and fall at times, she slipped on the steps and started to slide down causing worse than usual low back pain and left hip pain. She tried to catch herself by grabbing the railing of the left hand and strained her left shoulder. She also hurt her right elbow. She now has pain to the left shoulder right elbow and worse than usual pain to the low back and left hip. She has been walking with worse pain than usual. Her back pain always radiates down her left leg but now she has some new numbness to the left lateral foot. She has no new weakness to the left leg or anywhere. She has no change in bowel and bladder function. There is no head or neck injury. She has no amnesia headache lightheadedness chest pain shortness breath abdominal pain or upper back pain. She has no localized weakness or numbness other than her left lateral foot. She has decreased range of motion of the shoulder due to pain she also has slight decreased range of motion to her right elbow with inability to fully extend the elbow due to pain. Past Medical History  Diagnosis Date  . Seizure   . High cholesterol   . ADHD (attention deficit hyperactivity disorder)   . Bipolar disorder    chronic back pain radiating to left leg with chronic left chest pain as well  Past Surgical History  Procedure Date  . Abdominal hysterectomy   . Ovary removed bilateral  . Leg surgery left leg  . Facial reconstructive      History reviewed. No pertinent family history.  History  Substance Use Topics  . Smoking status: Current Everyday Smoker -- 0.5 packs/day  . Smokeless  tobacco: Not on file  . Alcohol Use: Yes     rarely    OB History    Grav Para Term Preterm Abortions TAB SAB Ect Mult Living                  Review of Systems  Constitutional: Negative for fever.       10 Systems reviewed and are negative for acute change except as noted in the HPI.  HENT: Negative for congestion.   Eyes: Negative for discharge and redness.  Respiratory: Negative for cough and shortness of breath.   Cardiovascular: Negative for chest pain.  Gastrointestinal: Negative for vomiting and abdominal pain.  Musculoskeletal: Positive for back pain and arthralgias.  Skin: Negative for rash.  Neurological: Negative for syncope, numbness and headaches.  Psychiatric/Behavioral:       No behavior change.    Allergies  Abilify; Haldol; Keflex; Tegretol; Toradol; and Zofran  Home Medications   Current Outpatient Rx  Name Route Sig Dispense Refill  . ACETAMINOPHEN 500 MG PO TABS Oral Take 1,000 mg by mouth every 6 (six) hours as needed. pain     . DIAZEPAM 5 MG PO TABS Oral Take 10 mg by mouth every 12 (twelve) hours as needed. Nerves and muscle spasms     . GABAPENTIN 800 MG PO TABS Oral Take 800 mg by mouth 4 (four) times daily.      Marland Kitchen  IBUPROFEN 200 MG PO TABS Oral Take 800 mg by mouth every 8 (eight) hours as needed. pain     . OXCARBAZEPINE 300 MG PO TABS Oral Take 300 mg by mouth daily.      Marland Kitchen PREGABALIN 100 MG PO CAPS Oral Take 100 mg by mouth 2 (two) times daily.      . SERTRALINE HCL 100 MG PO TABS Oral Take 200 mg by mouth daily.      . OXYCODONE-ACETAMINOPHEN 5-325 MG PO TABS Oral Take 2 tablets by mouth every 4 (four) hours as needed for pain. 20 tablet 0  . PREDNISONE 20 MG PO TABS  3 tabs po day one, then 2 po daily x 4 days 11 tablet 0    BP 150/96  Pulse 91  Temp(Src) 98.5 F (36.9 C) (Oral)  Resp 18  Ht 5\' 8"  (1.727 m)  Wt 185 lb (83.915 kg)  BMI 28.13 kg/m2  SpO2 97%  Physical Exam  Nursing note and vitals reviewed. Constitutional:        Awake, alert, nontoxic appearance with baseline speech.  HENT:  Head: Atraumatic.  Eyes: Pupils are equal, round, and reactive to light. Right eye exhibits no discharge. Left eye exhibits no discharge.  Neck: Neck supple.  Cardiovascular: Normal rate and regular rhythm.   No murmur heard. Pulmonary/Chest: Effort normal and breath sounds normal. No respiratory distress. She has no wheezes. She has no rales. She exhibits no tenderness.  Abdominal: Soft. Bowel sounds are normal. She exhibits no mass. There is no tenderness. There is no rebound.  Musculoskeletal: She exhibits tenderness. She exhibits no edema.       Thoracic back: She exhibits no tenderness.       Lumbar back: She exhibits no tenderness.       Bilateral lower extremities non tender except left hip  without new rashes or color change, baseline ROM with intact DP pulses, CR<2 secs all digits bilaterally, sensation baseline light touch bilaterally for pt except decreased light touch to the left lateral foot only, DTR's symmetric and intact bilaterally KJ / AJ, motor symmetric bilateral 5 / 5 hip flexion, quadriceps, hamstrings, EHL, foot dorsiflexion, foot plantarflexion; right arm is tender only at the elbow and it is diffusely tender the right elbow she has no tenderness to the right shoulder wrist or hand she is capillary refill less than 2 seconds the right hand with normal light touch to the right hand; her left arm has diffuse left shoulder tenderness but no tenderness to the elbow wrist or hand. Her left hand is capillary refill less than 2 seconds with normal light touch and 5 out of 5 strength in the distributions of the median, radial, and ulnar nerve distribution. Her left shoulder has abnormal drop test due to pain but she is able to hold her left shoulder abducted for a short time.  She has decreased range of motion to the left shoulder due to pain. She has slight decreased range of motion to the right elbow due to pain with  inability to fully extend the right elbow.  Neurological:       Mental status baseline for patient.  Upper extremity motor strength and sensation intact and symmetric bilaterally. Major cranial nerves are intact pupils equal and reactive extraocular movements intact peripheral visual fields full to confrontation normal light touch bilaterally in all 4 extremities out of 5 strength in all 4 extremities.  Skin: No rash noted.  Psychiatric: She has a normal mood and  affect.    ED Course  Procedures (including critical care time)  Labs Reviewed - No data to display No results found.   1. Rotator cuff injury   2. Shoulder pain, acute   3. Sprain of elbow   4. Contusion of left hip   5. Contusion of lower back   6. Chronic low back pain   7. Lumbar radiculopathy       MDM  I doubt any other EMC precluding discharge at this time including, but not necessarily limited to the following:cauda equina.        Hurman Horn, MD 12/17/10 2250

## 2010-12-13 NOTE — ED Notes (Signed)
Pt states " my ride is here. Can you find out if I can go home?" will notify MD of pt request. nad noted.

## 2011-04-10 ENCOUNTER — Emergency Department (HOSPITAL_COMMUNITY)
Admission: EM | Admit: 2011-04-10 | Discharge: 2011-04-10 | Disposition: A | Discharge status: Other Acute Inpatient Hospital | Payer: PRIVATE HEALTH INSURANCE | Attending: Emergency Medicine | Admitting: Emergency Medicine

## 2011-04-10 ENCOUNTER — Emergency Department (HOSPITAL_COMMUNITY): Payer: PRIVATE HEALTH INSURANCE

## 2011-04-10 DIAGNOSIS — E7800 Pure hypercholesterolemia, unspecified: Secondary | ICD-10-CM | POA: Insufficient documentation

## 2011-04-10 DIAGNOSIS — F909 Attention-deficit hyperactivity disorder, unspecified type: Secondary | ICD-10-CM | POA: Insufficient documentation

## 2011-04-10 DIAGNOSIS — R404 Transient alteration of awareness: Secondary | ICD-10-CM | POA: Insufficient documentation

## 2011-04-10 DIAGNOSIS — Z79899 Other long term (current) drug therapy: Secondary | ICD-10-CM | POA: Insufficient documentation

## 2011-04-10 DIAGNOSIS — F3130 Bipolar disorder, current episode depressed, mild or moderate severity, unspecified: Secondary | ICD-10-CM | POA: Insufficient documentation

## 2011-04-10 DIAGNOSIS — F17200 Nicotine dependence, unspecified, uncomplicated: Secondary | ICD-10-CM | POA: Insufficient documentation

## 2011-04-10 DIAGNOSIS — R45851 Suicidal ideations: Secondary | ICD-10-CM | POA: Insufficient documentation

## 2011-04-10 LAB — CBC
Hemoglobin: 13.6 g/dL (ref 12.0–15.0)
RBC: 4.39 MIL/uL (ref 3.87–5.11)
WBC: 9 10*3/uL (ref 4.0–10.5)

## 2011-04-10 LAB — DIFFERENTIAL
Basophils Relative: 0 % (ref 0–1)
Lymphocytes Relative: 30 % (ref 12–46)
Lymphs Abs: 2.7 10*3/uL (ref 0.7–4.0)
Monocytes Relative: 4 % (ref 3–12)
Neutro Abs: 5.8 10*3/uL (ref 1.7–7.7)
Neutrophils Relative %: 65 % (ref 43–77)

## 2011-04-10 LAB — URINALYSIS, ROUTINE W REFLEX MICROSCOPIC
Bilirubin Urine: NEGATIVE
Ketones, ur: NEGATIVE mg/dL
Nitrite: NEGATIVE
pH: 6 (ref 5.0–8.0)

## 2011-04-10 LAB — BASIC METABOLIC PANEL
BUN: 9 mg/dL (ref 6–23)
Chloride: 110 mEq/L (ref 96–112)
GFR calc Af Amer: >90 mL/min (ref >90)
Glucose, Bld: 138 mg/dL — ABNORMAL HIGH (ref 70–99)
Potassium: 3.5 mEq/L (ref 3.5–5.1)

## 2011-04-10 LAB — RAPID URINE DRUG SCREEN, HOSP PERFORMED
Barbiturates: NOT DETECTED
Benzodiazepines: NOT DETECTED
Cocaine: POSITIVE — AB

## 2011-04-10 MED ORDER — OXCARBAZEPINE 300 MG PO TABS
300.0000 mg | ORAL_TABLET | Freq: Once | ORAL | Status: AC
  Administered 2011-04-10: 300 mg via ORAL
  Filled 2011-04-10: qty 1

## 2011-04-10 MED ORDER — LORAZEPAM 1 MG PO TABS
1.0000 mg | ORAL_TABLET | ORAL | Status: AC | PRN
  Administered 2011-04-10 (×2): 1 mg via ORAL
  Filled 2011-04-10 (×2): qty 1

## 2011-04-10 MED ORDER — NICOTINE 21 MG/24HR TD PT24
21.0000 mg | MEDICATED_PATCH | Freq: Every day | TRANSDERMAL | Status: DC
  Administered 2011-04-10: 21 mg via TRANSDERMAL
  Filled 2011-04-10: qty 1

## 2011-04-10 MED ORDER — ACETAMINOPHEN 325 MG PO TABS: 650.0000 mg | ORAL_TABLET | ORAL | Status: DC | PRN

## 2011-04-10 MED ORDER — BACITRACIN ZINC 500 UNIT/GM EX OINT
TOPICAL_OINTMENT | CUTANEOUS | Status: AC
  Administered 2011-04-10: 1
  Filled 2011-04-10: qty 0.9

## 2011-04-10 MED ORDER — TETANUS-DIPHTH-ACELL PERTUSSIS 5-2.5-18.5 LF-MCG/0.5 IM SUSP
0.5000 mL | Freq: Once | INTRAMUSCULAR | Status: DC
  Filled 2011-04-10: qty 0.5

## 2011-04-10 MED ORDER — GABAPENTIN 400 MG PO CAPS
800.0000 mg | ORAL_CAPSULE | Freq: Four times a day (QID) | ORAL | Status: DC
  Administered 2011-04-10 (×2): 800 mg via ORAL
  Filled 2011-04-10 (×2): qty 2

## 2011-04-10 MED ORDER — SERTRALINE HCL 50 MG PO TABS
200.0000 mg | ORAL_TABLET | Freq: Every day | ORAL | Status: DC
  Administered 2011-04-10: 100 mg via ORAL
  Filled 2011-04-10 (×3): qty 4

## 2011-04-10 MED ORDER — RISPERIDONE 1 MG PO TABS
1.0000 mg | ORAL_TABLET | Freq: Once | ORAL | Status: DC
  Filled 2011-04-10: qty 1

## 2011-04-10 NOTE — ED Notes (Signed)
Pt resting comfortably with eyes closed, equal bil chest rise and fall.  Pt ate all of lunch tray.  nad noted.  Sitter at bedside.

## 2011-04-10 NOTE — ED Notes (Signed)
Pt up to restroom.  Cooperative at this time.  nad noted

## 2011-04-10 NOTE — ED Notes (Signed)
Pt awake, alert, calm, cooperative.  Eating lunch tray.  nad noted.  Sitter at bedside.
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
## 2011-04-10 NOTE — ED Notes (Signed)
Pt more calm, cooperative at this time.  Second breakfast tray given per pt request.  Bacitracin ointment and telfa dressing applied to left forearm, pt admits to cutting self x 4 days ago with razor blades.  Pt has multiple superficial cuts to left wrist/forearm, no bleeding/drainage noted, no redness/swelling.  [place] [employer] at bedside at this time.
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
## 2011-04-10 NOTE — ED Notes (Signed)
Pt refused tetnus injection stating " I don't want a shot."  Explained risks and benefits of refusing injection, pt verbalized understanding.  Breakfast tray given.  [employer] at bedside.
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
## 2011-05-22 ENCOUNTER — Emergency Department (HOSPITAL_COMMUNITY)
Admission: EM | Admit: 2011-05-22 | Discharge: 2011-05-22 | Disposition: A | Discharge status: Home / Self Care | Payer: PRIVATE HEALTH INSURANCE | Attending: Emergency Medicine | Admitting: Emergency Medicine

## 2011-05-22 ENCOUNTER — Encounter (HOSPITAL_COMMUNITY): Payer: Self-pay | Admitting: Emergency Medicine

## 2011-05-22 DIAGNOSIS — H6693 Otitis media, unspecified, bilateral: Secondary | ICD-10-CM

## 2011-05-22 DIAGNOSIS — R569 Unspecified convulsions: Secondary | ICD-10-CM | POA: Insufficient documentation

## 2011-05-22 DIAGNOSIS — J45909 Unspecified asthma, uncomplicated: Secondary | ICD-10-CM | POA: Insufficient documentation

## 2011-05-22 DIAGNOSIS — Z881 Allergy status to other antibiotic agents status: Secondary | ICD-10-CM | POA: Insufficient documentation

## 2011-05-22 DIAGNOSIS — Z8249 Family history of ischemic heart disease and other diseases of the circulatory system: Secondary | ICD-10-CM | POA: Insufficient documentation

## 2011-05-22 DIAGNOSIS — Z888 Allergy status to other drugs, medicaments and biological substances status: Secondary | ICD-10-CM | POA: Insufficient documentation

## 2011-05-22 DIAGNOSIS — E7800 Pure hypercholesterolemia, unspecified: Secondary | ICD-10-CM | POA: Insufficient documentation

## 2011-05-22 DIAGNOSIS — H6690 Otitis media, unspecified, unspecified ear: Secondary | ICD-10-CM | POA: Insufficient documentation

## 2011-05-22 DIAGNOSIS — F988 Other specified behavioral and emotional disorders with onset usually occurring in childhood and adolescence: Secondary | ICD-10-CM | POA: Insufficient documentation

## 2011-05-22 DIAGNOSIS — Z833 Family history of diabetes mellitus: Secondary | ICD-10-CM | POA: Insufficient documentation

## 2011-05-22 DIAGNOSIS — Z90710 Acquired absence of both cervix and uterus: Secondary | ICD-10-CM | POA: Insufficient documentation

## 2011-05-22 DIAGNOSIS — F17200 Nicotine dependence, unspecified, uncomplicated: Secondary | ICD-10-CM | POA: Insufficient documentation

## 2011-05-22 MED ORDER — AMOXICILLIN-POT CLAVULANATE 875-125 MG PO TABS
1.0000 | ORAL_TABLET | Freq: Once | ORAL | Status: AC
  Administered 2011-05-22: 1 via ORAL
  Filled 2011-05-22: qty 1

## 2011-05-22 MED ORDER — HYDROCODONE-ACETAMINOPHEN 5-325 MG PO TABS
1.0000 | ORAL_TABLET | Freq: Once | ORAL | Status: AC
  Administered 2011-05-22: 1 via ORAL
  Filled 2011-05-22: qty 1

## 2011-05-22 MED ORDER — AMOXICILLIN-POT CLAVULANATE 500-125 MG PO TABS: ORAL_TABLET | ORAL | Status: DC

## 2011-05-22 MED ORDER — HYDROCODONE-ACETAMINOPHEN 5-325 MG PO TABS: ORAL_TABLET | ORAL | Status: AC

## 2011-05-22 NOTE — Discharge Instructions (Signed)
Otitis Media, Adult A middle ear infection is an infection in the space behind the eardrum. The medical name for this is "otitis media." It may happen after a common cold. It is caused by a germ that starts growing in that space. You may feel swollen glands in your neck on the side of the ear infection. HOME CARE INSTRUCTIONS   Take your medicine as directed until it is gone, even if you feel better after the first few days.   Only take over-the-counter or prescription medicines for pain, discomfort, or fever as directed by your caregiver.   Occasional use of a nasal decongestant a couple times per day may help with discomfort and help the eustachian tube to drain better.  Follow up with your caregiver in 10 to 14 days or as directed, to be certain that the infection has cleared. Not keeping the appointment could result in a chronic or permanent injury, pain, hearing loss and disability. If there is any problem keeping the appointment, you must call back to this facility for assistance. SEEK IMMEDIATE MEDICAL CARE IF:   You are not getting better in 2 to 3 days.   You have pain that is not controlled with medication.   You feel worse instead of better.   You cannot use the medication as directed.   You develop swelling, redness or pain around the ear or stiffness in your neck.  MAKE SURE YOU:   Understand these instructions.   Will watch your condition.   Will get help right away if you are not doing well or get worse.  Document Released: 11/23/2003 Document Revised: 02/06/2011 Document Reviewed: 09/24/2007 [facility] Patient Information 2012 [place], [place].     Medications as directed.  Follow-up with your doctor or return to ER for any worsening symtpoms

## 2011-05-22 NOTE — ED Notes (Signed)
Patient c/o bilateral earache. Per patient right ear started hurting yesterday and left ear this morning. Patient reports some difficulty in hearing. Denies any drainage. Per patient running fevers-last took ibuprofen at 06:30 am.

## 2011-05-22 NOTE — ED Provider Notes (Signed)
History     CSN: [id number]  Arrival date & time 05/22/11  1321   First MD Initiated Contact with Patient 05/22/11 1332      Chief Complaint  Patient presents with  . Otalgia    (Consider location/radiation/quality/duration/timing/severity/associated sxs/prior treatment) HPI Comments: Patient complains of bilateral ear pain with onset of right ear pain yesterday and left ear pain today. She states pain is persistent and she describes the pain as a throbbing sensation. States that she had a fever last evening and took ibuprofen earlier this morning. She denies any headache, neck pain or stiffness, vomiting, sore throat or nasal congestion.  Patient is a 36 y.o. female presenting with ear pain. The history is provided by the patient. No language interpreter was used.  Otalgia This is a new problem. The current episode started yesterday. There is pain in both ears. The problem occurs constantly. The problem has been gradually worsening. The maximum temperature recorded prior to her arrival was 100 to 100.9 F. The fever has been present for less than 1 day. The pain is moderate. Pertinent negatives include no ear discharge, no headaches, no rhinorrhea, no sore throat, no vomiting, no neck pain, no cough and no rash.    Past Medical History  Diagnosis Date  . Seizure   . High cholesterol   . ADHD (attention deficit hyperactivity disorder)   . Bipolar disorder   . Asthma     Past Surgical History  Procedure Date  . Abdominal hysterectomy   . Ovary removed bilateral  . Leg surgery left leg  . Facial reconstructive      Family History  Problem Relation Age of Onset  . Diabetes Other   . Heart failure Other     History  Substance Use Topics  . Smoking status: Current Everyday Smoker -- 0.5 packs/day for 20 years    Types: Cigarettes  . Smokeless tobacco: Never Used  . Alcohol Use: No    OB History    Grav Para Term Preterm Abortions TAB SAB Ect Mult Living   6 3  3 3  3    3       Review of Systems  Constitutional: Positive for fever. Negative for appetite change.  HENT: Positive for ear pain. Negative for sore throat, facial swelling, rhinorrhea, neck pain, neck stiffness and ear discharge.        Hearing is decreased  Respiratory: Negative for cough.   Gastrointestinal: Negative for vomiting.  Skin: Negative for rash.  Neurological: Negative for dizziness and headaches.  All other systems reviewed and are negative.    Allergies  Abilify; Haldol; Keflex; Tegretol; Toradol; and Zofran  Home Medications   Current Outpatient Rx  Name Route Sig Dispense Refill  . ACETAMINOPHEN 500 MG PO TABS Oral Take 1,000 mg by mouth every 6 (six) hours as needed. pain     . DIAZEPAM 5 MG PO TABS Oral Take 10 mg by mouth every 12 (twelve) hours as needed. Nerves and muscle spasms     . GABAPENTIN 800 MG PO TABS Oral Take 800 mg by mouth 4 (four) times daily.      . IBUPROFEN 200 MG PO TABS Oral Take 800 mg by mouth every 8 (eight) hours as needed. pain     . OXCARBAZEPINE 300 MG PO TABS Oral Take 300 mg by mouth daily.      Marland Kitchen PREGABALIN 100 MG PO CAPS Oral Take 100 mg by mouth 2 (two) times daily.      Marland Kitchen  SERTRALINE HCL 100 MG PO TABS Oral Take 200 mg by mouth daily.        BP 107/75  Pulse 99  Temp(Src) 97.7 F (36.5 C) (Oral)  Resp 16  Ht 5\' 8"  (1.727 m)  Wt 180 lb (81.647 kg)  BMI 27.37 kg/m2  SpO2 100%  Physical Exam  Nursing note and vitals reviewed. Constitutional: She is oriented to person, place, and time. She appears well-developed and well-nourished. No distress.  HENT:  Head: Normocephalic and atraumatic.  Right Ear: There is tenderness. No drainage. No mastoid tenderness. Tympanic membrane is erythematous. Tympanic membrane is not bulging. No hemotympanum.  Left Ear: There is tenderness. No drainage. No mastoid tenderness. Tympanic membrane is erythematous and bulging. No hemotympanum.  Mouth/Throat: Uvula is midline, oropharynx is clear and  moist and mucous membranes are normal.  Neck: Normal range of motion. Neck supple.  Cardiovascular: Normal rate, regular rhythm and normal heart sounds.   No murmur heard. Pulmonary/Chest: Effort normal and breath sounds normal. No respiratory distress.  Lymphadenopathy:    She has no cervical adenopathy.  Neurological: She is alert and oriented to person, place, and time. She exhibits normal muscle tone. Coordination normal.  Skin: Skin is warm and dry.    ED Course  Procedures (including critical care time)       MDM     Patient with bilateral ear pain complains of worsening pain for several days. Her vitals are stable, she is afebrile and nontoxic appearing. Patient has bilateral otitis media with bulging of the left TM. No perforation at this time. No cervical lymphadenopathy is present. No meningeal signs.  Treat patient with Augmentin and a short course of pain medication she agrees to followup with her primary care physician or to return here if her symptoms worsen.  Patient / Family / Caregiver understand and agree with initial ED impression and plan with expectations set for ED visit. Pt stable in ED with no significant deterioration in condition. Pt feels improved after observation and/or treatment in ED.       Breella Vanostrand L. [place], [place] 05/22/11 1718

## 2011-05-23 NOTE — ED Provider Notes (Signed)
Medical screening examination/treatment/procedure(s) were performed by non-physician practitioner and as supervising physician I was immediately available for consultation/collaboration.  Fairley Copher S. Zidan Helget, MD 05/23/11 0709 

## 2011-06-02 ENCOUNTER — Inpatient Hospital Stay (HOSPITAL_COMMUNITY)
Admission: EM | Admit: 2011-06-02 | Discharge: 2011-06-09 | DRG: 195 | Disposition: A | Discharge status: Home / Self Care | Payer: PRIVATE HEALTH INSURANCE | Attending: Internal Medicine | Admitting: Internal Medicine

## 2011-06-02 ENCOUNTER — Encounter (HOSPITAL_COMMUNITY): Payer: Self-pay

## 2011-06-02 ENCOUNTER — Emergency Department (HOSPITAL_COMMUNITY): Payer: PRIVATE HEALTH INSURANCE

## 2011-06-02 DIAGNOSIS — E7800 Pure hypercholesterolemia, unspecified: Secondary | ICD-10-CM | POA: Diagnosis present

## 2011-06-02 DIAGNOSIS — R509 Fever, unspecified: Secondary | ICD-10-CM | POA: Diagnosis present

## 2011-06-02 DIAGNOSIS — R0902 Hypoxemia: Secondary | ICD-10-CM | POA: Diagnosis present

## 2011-06-02 DIAGNOSIS — N289 Disorder of kidney and ureter, unspecified: Secondary | ICD-10-CM | POA: Diagnosis present

## 2011-06-02 DIAGNOSIS — J189 Pneumonia, unspecified organism: Principal | ICD-10-CM | POA: Diagnosis present

## 2011-06-02 DIAGNOSIS — G40909 Epilepsy, unspecified, not intractable, without status epilepticus: Secondary | ICD-10-CM | POA: Diagnosis present

## 2011-06-02 DIAGNOSIS — E876 Hypokalemia: Secondary | ICD-10-CM | POA: Diagnosis present

## 2011-06-02 DIAGNOSIS — J99 Respiratory disorders in diseases classified elsewhere: Secondary | ICD-10-CM | POA: Diagnosis present

## 2011-06-02 DIAGNOSIS — F909 Attention-deficit hyperactivity disorder, unspecified type: Secondary | ICD-10-CM | POA: Diagnosis present

## 2011-06-02 DIAGNOSIS — E860 Dehydration: Secondary | ICD-10-CM | POA: Diagnosis present

## 2011-06-02 DIAGNOSIS — D869 Sarcoidosis, unspecified: Secondary | ICD-10-CM | POA: Diagnosis present

## 2011-06-02 DIAGNOSIS — F319 Bipolar disorder, unspecified: Secondary | ICD-10-CM | POA: Diagnosis present

## 2011-06-02 DIAGNOSIS — J45909 Unspecified asthma, uncomplicated: Secondary | ICD-10-CM | POA: Diagnosis present

## 2011-06-02 DIAGNOSIS — F1911 Other psychoactive substance abuse, in remission: Secondary | ICD-10-CM | POA: Diagnosis present

## 2011-06-02 DIAGNOSIS — F17200 Nicotine dependence, unspecified, uncomplicated: Secondary | ICD-10-CM | POA: Diagnosis present

## 2011-06-02 LAB — DIFFERENTIAL
Basophils Relative: 0 % (ref 0–1)
Eosinophils Absolute: 0 10*3/uL (ref 0.0–0.7)
Lymphocytes Relative: 14 % (ref 12–46)
Lymphs Abs: 1.6 10*3/uL (ref 0.7–4.0)
Neutro Abs: 9.1 10*3/uL — ABNORMAL HIGH (ref 1.7–7.7)

## 2011-06-02 LAB — BASIC METABOLIC PANEL
BUN: 11 mg/dL (ref 6–23)
Creatinine, Ser: 1.24 mg/dL — ABNORMAL HIGH (ref 0.50–1.10)
GFR calc non Af Amer: 56 mL/min — ABNORMAL LOW (ref >90)
Glucose, Bld: 127 mg/dL — ABNORMAL HIGH (ref 70–99)
Potassium: 2.9 mEq/L — ABNORMAL LOW (ref 3.5–5.1)

## 2011-06-02 LAB — CBC
HCT: 40.3 % (ref 36.0–46.0)
Hemoglobin: 14.5 g/dL (ref 12.0–15.0)
MCH: 30.4 pg (ref 26.0–34.0)
MCHC: 36 g/dL (ref 30.0–36.0)
MCV: 84.5 fL (ref 78.0–100.0)
RDW: 13.2 % (ref 11.5–15.5)

## 2011-06-02 MED ORDER — ALBUTEROL SULFATE (5 MG/ML) 0.5% IN NEBU
INHALATION_SOLUTION | RESPIRATORY_TRACT | Status: AC
  Administered 2011-06-02: 5 mg
  Filled 2011-06-02: qty 1

## 2011-06-02 MED ORDER — KETOROLAC TROMETHAMINE 30 MG/ML IJ SOLN
INTRAMUSCULAR | Status: AC
  Administered 2011-06-02: 30 mg
  Filled 2011-06-02: qty 1

## 2011-06-02 MED ORDER — ALBUTEROL SULFATE (5 MG/ML) 0.5% IN NEBU
5.0000 mg | INHALATION_SOLUTION | Freq: Once | RESPIRATORY_TRACT | Status: DC
Start: 2011-06-02 — End: 2011-06-03

## 2011-06-02 MED ORDER — IPRATROPIUM BROMIDE 0.02 % IN SOLN
RESPIRATORY_TRACT | Status: AC
  Administered 2011-06-02: 0.5 mg via RESPIRATORY_TRACT
  Filled 2011-06-02: qty 2.5

## 2011-06-02 MED ORDER — IPRATROPIUM BROMIDE 0.02 % IN SOLN
0.5000 mg | Freq: Once | RESPIRATORY_TRACT | Status: AC
  Administered 2011-06-02: 0.5 mg via RESPIRATORY_TRACT

## 2011-06-02 MED ORDER — ALBUTEROL SULFATE (5 MG/ML) 0.5% IN NEBU
5.0000 mg | INHALATION_SOLUTION | Freq: Once | RESPIRATORY_TRACT | Status: AC
  Administered 2011-06-02: 5 mg via RESPIRATORY_TRACT
  Filled 2011-06-02: qty 1

## 2011-06-02 MED ORDER — SODIUM CHLORIDE 0.9 % IV BOLUS (SEPSIS)
1000.0000 mL | Freq: Once | INTRAVENOUS | Status: AC
  Administered 2011-06-03: 1000 mL via INTRAVENOUS

## 2011-06-02 NOTE — ED Notes (Signed)
Vomiting and diarrhea for 3 days, has not felt well for a week, now feels sob.

## 2011-06-02 NOTE — ED Provider Notes (Signed)
History  This chart was scribed for Donnetta Hutching, MD by Bennett Scrape. This patient was seen in room APA02/APA02 and the patient's care was started at 11:18PM.  CSN: [id number]  Arrival date & time 06/02/11  2216   First MD Initiated Contact with Patient 06/02/11 2310      Chief Complaint  Patient presents with  . Shortness of Breath  . Emesis  . Diarrhea    The history is provided by the patient. No language interpreter was used.    Latasha [name] is a 36 y.o. female who presents to the Emergency Department complaining of 2 weeks ago of gradual onset, gradually worsening, constant infection that she states started of as a severe ear infection. Pt states that the infection spread after her left ear ruptured. She reports having fluid in her left jar, left neck, and now her lungs. She c/o 2 days of productive coughing of thick yellow mucus and 3 days of diarrhea and emesis. She reports that it hurts with deep breathes. She reports taking ib profen for body aches with no improvement in symptoms. She denies congestion, fever, chest pain, HA and confusion as associated symptoms. Pt is a current everyday smoker but denies alcohol use.   Pt's PCP is Dr. Felecia Shelling   Past Medical History  Diagnosis Date  . Seizure   . High cholesterol   . ADHD (attention deficit hyperactivity disorder)   . Bipolar disorder   . Asthma     Past Surgical History  Procedure Date  . Abdominal hysterectomy   . Ovary removed bilateral  . Leg surgery left leg  . Facial reconstructive      Family History  Problem Relation Age of Onset  . Diabetes Other   . Heart failure Other     History  Substance Use Topics  . Smoking status: Current Everyday Smoker -- 0.5 packs/day for 20 years    Types: Cigarettes  . Smokeless tobacco: Never Used  . Alcohol Use: No    OB History    Grav Para Term Preterm Abortions TAB SAB Ect Mult Living   6 3  3 3  3   3       Review of Systems  A complete 10 system review  of systems was obtained and all systems are negative except as noted in the HPI.   Allergies  Abilify; Haldol; Keflex; Tegretol; Toradol; and Zofran  Home Medications   Current Outpatient Rx  Name Route Sig Dispense Refill  . ALBUTEROL SULFATE HFA 108 (90 BASE) MCG/ACT IN AERS Inhalation Inhale 2 puffs into the lungs every 6 (six) hours as needed.    Marland Kitchen DIAZEPAM 5 MG PO TABS Oral Take 10 mg by mouth every 12 (twelve) hours as needed. Nerves and muscle spasms     . GABAPENTIN 800 MG PO TABS Oral Take 800 mg by mouth 4 (four) times daily.      . IBUPROFEN 200 MG PO TABS Oral Take 800 mg by mouth every 4 (four) hours as needed. pain    . PREGABALIN 100 MG PO CAPS Oral Take 100 mg by mouth 2 (two) times daily.      Marland Kitchen RISPERIDONE 3 MG PO TABS Oral Take 3 mg by mouth 2 (two) times daily.    . SERTRALINE HCL 100 MG PO TABS Oral Take 200 mg by mouth daily.      Marland Kitchen HYDROCODONE-ACETAMINOPHEN 5-325 MG PO TABS  Take one-two tabs po q 4-6 hrs prn pain 12 tablet  0    Triage Vitals: BP 111/61  Pulse 114  Temp(Src) 99.2 F (37.3 C) (Oral)  Resp 20  Ht 5\' 8"  (1.727 m)  Wt 180 lb (81.647 kg)  BMI 27.37 kg/m2  Physical Exam  Nursing note and vitals reviewed. Constitutional: She is oriented to person, place, and time. She appears well-developed and well-nourished.  HENT:  Head: Normocephalic and atraumatic.  Eyes: Conjunctivae and EOM are normal.  Musculoskeletal: Normal range of motion. She exhibits no edema.  Neurological: She is alert and oriented to person, place, and time.  Skin: Skin is warm and dry.  Psychiatric: She has a normal mood and affect. Her behavior is normal.    ED Course  Procedures (including critical care time)  DIAGNOSTIC STUDIES: Oxygen Saturation is 94% on room air, adequate by my interpretation.    COORDINATION OF CARE: 11:22PM: Discussed IV fluids, chest x-ray  and breathing treatments with pt and pt agreed to plan.   Labs Reviewed  CBC - Abnormal; Notable for  the following:    WBC 11.1 (*)    Platelets 125 (*)    All other components within normal limits  DIFFERENTIAL  BASIC METABOLIC PANEL   No results found.   No diagnosis found. CRITICAL CARE Performed by: Donnetta Hutching   Total critical care time: 30  Critical care time was exclusive of separately billable procedures and treating other patients.  Critical care was necessary to treat or prevent imminent or life-threatening deterioration.  Critical care was time spent personally by me on the following activities: development of treatment plan with patient and/or surrogate as well as nursing, discussions with consultants, evaluation of patient's response to treatment, examination of patient, obtaining history from patient or surrogate, ordering and performing treatments and interventions, ordering and review of laboratory studies, ordering and review of radiographic studies, pulse oximetry and re-evaluation of patient's condition.  MDM  Patient is dyspneic, tachycardic, hypoxemic. Clinical picture suggest community-acquired pneumonia. Confirmed by chest x-ray. Will hydrate patient, breathing treatments, Rx IV Zithromax and Rocephin. Admit to telemetry. multiple rechecks in ED.       I personally performed the services described in this documentation, which was scribed in my presence. The recorded information has been reviewed and considered.    Donnetta Hutching, MD 06/03/11 [phone]

## 2011-06-03 ENCOUNTER — Emergency Department (HOSPITAL_COMMUNITY): Payer: PRIVATE HEALTH INSURANCE

## 2011-06-03 ENCOUNTER — Encounter (HOSPITAL_COMMUNITY): Payer: Self-pay | Admitting: *Deleted

## 2011-06-03 ENCOUNTER — Inpatient Hospital Stay (HOSPITAL_COMMUNITY): Payer: PRIVATE HEALTH INSURANCE

## 2011-06-03 DIAGNOSIS — E876 Hypokalemia: Secondary | ICD-10-CM | POA: Diagnosis present

## 2011-06-03 DIAGNOSIS — J189 Pneumonia, unspecified organism: Secondary | ICD-10-CM | POA: Diagnosis present

## 2011-06-03 DIAGNOSIS — F1911 Other psychoactive substance abuse, in remission: Secondary | ICD-10-CM | POA: Diagnosis present

## 2011-06-03 DIAGNOSIS — N289 Disorder of kidney and ureter, unspecified: Secondary | ICD-10-CM | POA: Diagnosis present

## 2011-06-03 DIAGNOSIS — R0902 Hypoxemia: Secondary | ICD-10-CM | POA: Diagnosis present

## 2011-06-03 LAB — EXPECTORATED SPUTUM ASSESSMENT W GRAM STAIN, RFLX TO RESP C

## 2011-06-03 LAB — STREP PNEUMONIAE URINARY ANTIGEN: Strep Pneumo Urinary Antigen: POSITIVE — AB

## 2011-06-03 LAB — URINALYSIS, ROUTINE W REFLEX MICROSCOPIC
Bilirubin Urine: NEGATIVE
Ketones, ur: NEGATIVE mg/dL
Specific Gravity, Urine: 1.02 (ref 1.005–1.030)
pH: 5.5 (ref 5.0–8.0)

## 2011-06-03 LAB — RAPID URINE DRUG SCREEN, HOSP PERFORMED
Barbiturates: NOT DETECTED
Benzodiazepines: NOT DETECTED
Cocaine: NOT DETECTED
Tetrahydrocannabinol: POSITIVE — AB

## 2011-06-03 LAB — HIV ANTIBODY (ROUTINE TESTING W REFLEX): HIV: NONREACTIVE

## 2011-06-03 MED ORDER — VANCOMYCIN HCL IN DEXTROSE 1-5 GM/200ML-% IV SOLN
INTRAVENOUS | Status: AC
  Filled 2011-06-03: qty 200

## 2011-06-03 MED ORDER — METOCLOPRAMIDE HCL 5 MG/ML IJ SOLN
INTRAMUSCULAR | Status: AC
  Filled 2011-06-03: qty 2

## 2011-06-03 MED ORDER — SODIUM CHLORIDE 0.9 % IV BOLUS (SEPSIS)
1000.0000 mL | Freq: Once | INTRAVENOUS | Status: AC
  Administered 2011-06-03: 1000 mL via INTRAVENOUS

## 2011-06-03 MED ORDER — DEXTROSE 5 % IV SOLN
500.0000 mg | INTRAVENOUS | Status: DC
  Administered 2011-06-03 – 2011-06-08 (×6): 500 mg via INTRAVENOUS
  Filled 2011-06-03 (×6): qty 500

## 2011-06-03 MED ORDER — GABAPENTIN 400 MG PO CAPS
800.0000 mg | ORAL_CAPSULE | Freq: Four times a day (QID) | ORAL | Status: DC
  Administered 2011-06-03 – 2011-06-08 (×23): 800 mg via ORAL
  Filled 2011-06-03 (×4): qty 2
  Filled 2011-06-03: qty 1
  Filled 2011-06-03 (×19): qty 2

## 2011-06-03 MED ORDER — DEXTROSE 5 % IV SOLN
500.0000 mg | Freq: Once | INTRAVENOUS | Status: AC
  Administered 2011-06-03: 02:00:00 via INTRAVENOUS
  Filled 2011-06-03: qty 500

## 2011-06-03 MED ORDER — METOCLOPRAMIDE HCL 5 MG/ML IJ SOLN
10.0000 mg | Freq: Once | INTRAMUSCULAR | Status: AC
  Administered 2011-06-03: 10 mg via INTRAVENOUS

## 2011-06-03 MED ORDER — IBUPROFEN 600 MG PO TABS
600.0000 mg | ORAL_TABLET | Freq: Three times a day (TID) | ORAL | Status: DC | PRN
  Administered 2011-06-04 – 2011-06-08 (×8): 600 mg via ORAL
  Filled 2011-06-03 (×9): qty 1

## 2011-06-03 MED ORDER — VANCOMYCIN HCL IN DEXTROSE 1-5 GM/200ML-% IV SOLN
1000.0000 mg | Freq: Once | INTRAVENOUS | Status: AC
  Administered 2011-06-03: 1000 mg via INTRAVENOUS
  Filled 2011-06-03: qty 200

## 2011-06-03 MED ORDER — DEXTROSE 5 % IV SOLN
1.0000 g | INTRAVENOUS | Status: DC
  Administered 2011-06-03 – 2011-06-08 (×6): 1 g via INTRAVENOUS
  Filled 2011-06-03 (×6): qty 10

## 2011-06-03 MED ORDER — HYDROMORPHONE HCL PF 1 MG/ML IJ SOLN
INTRAMUSCULAR | Status: AC
  Administered 2011-06-03: 1 mg
  Filled 2011-06-03: qty 1

## 2011-06-03 MED ORDER — ALBUTEROL SULFATE (5 MG/ML) 0.5% IN NEBU
2.5000 mg | INHALATION_SOLUTION | RESPIRATORY_TRACT | Status: DC | PRN
Start: 2011-06-03 — End: 2011-06-03
  Administered 2011-06-03: 2.5 mg via RESPIRATORY_TRACT

## 2011-06-03 MED ORDER — IOHEXOL 300 MG/ML  SOLN
80.0000 mL | Freq: Once | INTRAMUSCULAR | Status: AC | PRN
  Administered 2011-06-03: 80 mL via INTRAVENOUS

## 2011-06-03 MED ORDER — DEXTROSE 5 % IV SOLN
1.0000 g | Freq: Once | INTRAVENOUS | Status: AC
  Administered 2011-06-03: 02:00:00 via INTRAVENOUS
  Filled 2011-06-03: qty 10

## 2011-06-03 MED ORDER — GUAIFENESIN 100 MG/5ML PO SOLN: 5.0000 mL | ORAL | Status: DC | PRN

## 2011-06-03 MED ORDER — PROMETHAZINE HCL 12.5 MG PO TABS
25.0000 mg | ORAL_TABLET | ORAL | Status: DC | PRN
  Administered 2011-06-03 – 2011-06-08 (×3): 25 mg via ORAL
  Filled 2011-06-03 (×3): qty 2

## 2011-06-03 MED ORDER — VANCOMYCIN HCL IN DEXTROSE 1-5 GM/200ML-% IV SOLN
1000.0000 mg | Freq: Two times a day (BID) | INTRAVENOUS | Status: DC
  Administered 2011-06-03 – 2011-06-05 (×4): 1000 mg via INTRAVENOUS
  Filled 2011-06-03 (×5): qty 200

## 2011-06-03 MED ORDER — IPRATROPIUM BROMIDE 0.02 % IN SOLN
0.5000 mg | Freq: Four times a day (QID) | RESPIRATORY_TRACT | Status: DC | PRN
  Administered 2011-06-03 – 2011-06-07 (×6): 0.5 mg via RESPIRATORY_TRACT
  Filled 2011-06-03 (×7): qty 2.5

## 2011-06-03 MED ORDER — ALBUTEROL SULFATE (5 MG/ML) 0.5% IN NEBU
2.5000 mg | INHALATION_SOLUTION | RESPIRATORY_TRACT | Status: DC | PRN
  Filled 2011-06-03: qty 0.5

## 2011-06-03 MED ORDER — PREGABALIN 50 MG PO CAPS
100.0000 mg | ORAL_CAPSULE | Freq: Two times a day (BID) | ORAL | Status: DC
  Administered 2011-06-03 – 2011-06-08 (×12): 100 mg via ORAL
  Filled 2011-06-03: qty 1
  Filled 2011-06-03 (×11): qty 2
  Filled 2011-06-03: qty 1

## 2011-06-03 MED ORDER — SERTRALINE HCL 50 MG PO TABS
200.0000 mg | ORAL_TABLET | Freq: Every day | ORAL | Status: DC
Start: 2011-06-03 — End: 2011-06-09
  Administered 2011-06-03 – 2011-06-08 (×6): 200 mg via ORAL
  Filled 2011-06-03 (×6): qty 4

## 2011-06-03 MED ORDER — POTASSIUM CHLORIDE IN NACL 20-0.9 MEQ/L-% IV SOLN
INTRAVENOUS | Status: DC
  Administered 2011-06-03 – 2011-06-07 (×7): via INTRAVENOUS

## 2011-06-03 MED ORDER — POTASSIUM CHLORIDE 20 MEQ PO PACK
40.0000 meq | PACK | Freq: Once | ORAL | Status: AC
  Administered 2011-06-03: 40 meq via ORAL
  Filled 2011-06-03: qty 2

## 2011-06-03 MED ORDER — GABAPENTIN 800 MG PO TABS
800.0000 mg | ORAL_TABLET | Freq: Four times a day (QID) | ORAL | Status: DC
  Filled 2011-06-03 (×6): qty 1

## 2011-06-03 MED ORDER — HYDROCOD POLST-CHLORPHEN POLST 10-8 MG/5ML PO LQCR
5.0000 mL | Freq: Two times a day (BID) | ORAL | Status: DC | PRN
  Administered 2011-06-03 – 2011-06-08 (×10): 5 mL via ORAL
  Filled 2011-06-03 (×10): qty 5

## 2011-06-03 MED ORDER — LEVALBUTEROL HCL 0.63 MG/3ML IN NEBU
0.6300 mg | INHALATION_SOLUTION | Freq: Four times a day (QID) | RESPIRATORY_TRACT | Status: DC
  Administered 2011-06-03 – 2011-06-05 (×9): 0.63 mg via RESPIRATORY_TRACT
  Filled 2011-06-03 (×8): qty 3

## 2011-06-03 MED ORDER — RISPERIDONE 1 MG PO TABS
3.0000 mg | ORAL_TABLET | Freq: Two times a day (BID) | ORAL | Status: DC
  Administered 2011-06-03 – 2011-06-08 (×12): 3 mg via ORAL
  Filled 2011-06-03: qty 3
  Filled 2011-06-03: qty 1
  Filled 2011-06-03 (×2): qty 3
  Filled 2011-06-03: qty 2
  Filled 2011-06-03 (×5): qty 3
  Filled 2011-06-03: qty 2
  Filled 2011-06-03 (×2): qty 3
  Filled 2011-06-03: qty 1

## 2011-06-03 NOTE — ED Notes (Signed)
Patient states she is not really allergic to toradol but it doesn't help with her pain because she is used to narcotics

## 2011-06-03 NOTE — Progress Notes (Signed)
UR Chart Review Completed  

## 2011-06-03 NOTE — H&P (Signed)
pcp dr Felecia Shelling  Chief Complaint:  Cough and fever  HPI: 36 year old female who was recently seen in the ED on March 21 was diagnosed with bilateral otitis media and was started on Augmentin. She completed 10 days of Augmentin and states that she is been subjectively running fever for a week and a half. She says that her left ear was draining after she started the antibiotics and she's completed antibiotics and now feels worse with nausea persistent cough and wheezing and subjective fevers. She denies any abdominal pain or chest pain but is short of breath. She is a smoker. She occasionally smokes marijuana. She has a history of cocaine abuse but says she has not had any in the past year and half. Denies any other drug abuse. She denies any hemoptysis but is having productive cough. She is being admitted for pneumonia and dehydration.  Review of Systems:  Otherwise negative  Past Medical History: Past Medical History  Diagnosis Date  . Seizure   . High cholesterol   . ADHD (attention deficit hyperactivity disorder)   . Bipolar disorder   . Asthma    Past Surgical History  Procedure Date  . Abdominal hysterectomy   . Ovary removed bilateral  . Leg surgery left leg  . Facial reconstructive      Medications: Prior to Admission medications   Medication Sig Start Date End Date Taking? Authorizing Provider  albuterol (PROAIR HFA) 108 (90 BASE) MCG/ACT inhaler Inhale 2 puffs into the lungs every 6 (six) hours as needed.   Yes Historical Provider, MD  diazepam (VALIUM) 5 MG tablet Take 10 mg by mouth every 12 (twelve) hours as needed. Nerves and muscle spasms    Yes Historical Provider, MD  gabapentin (NEURONTIN) 800 MG tablet Take 800 mg by mouth 4 (four) times daily.     Yes Historical Provider, MD  ibuprofen (ADVIL,MOTRIN) 200 MG tablet Take 800 mg by mouth every 4 (four) hours as needed. pain   Yes Historical Provider, MD  pregabalin (LYRICA) 100 MG capsule Take 100 mg by mouth 2 (two)  times daily.     Yes Historical Provider, MD  risperiDONE (RISPERDAL) 3 MG tablet Take 3 mg by mouth 2 (two) times daily.   Yes Historical Provider, MD  sertraline (ZOLOFT) 100 MG tablet Take 200 mg by mouth daily.     Yes Historical Provider, MD    Allergies:   Allergies  Allergen Reactions  . Abilify   . Haldol (Haloperidol Decanoate)   . Keflex   . Tegretol (Carbamazepine)   . Zofran     Social History:  reports that she has been smoking Cigarettes.  She has a 10 pack-year smoking history. She has never used smokeless tobacco. She reports that she uses illicit drugs (Marijuana). She reports that she does not drink alcohol.  Family History: Family History  Problem Relation Age of Onset  . Diabetes Other   . Heart failure Other     Physical Exam: Filed Vitals:   06/03/11 0245 06/03/11 0300 06/03/11 0315 06/03/11 0330  BP:  115/57  72/46  Pulse: 117 114 115 115  Temp:      TempSrc:      Resp:      Height:      Weight:      SpO2: 93% 94% 93% 95%   BP 107/71  Pulse 117  Temp(Src) 97.7 F (36.5 C) (Oral)  Resp 24  Ht 5\' 8"  (1.727 m)  Wt 81.4 kg (179 lb  7.3 oz)  BMI 27.29 kg/m2  SpO2 91% General appearance: alert, cooperative and no distress Lungs: wheezes bibasilar Heart: regular rate and rhythm, S1, S2 normal, no murmur, click, rub or gallop Abdomen: soft, non-tender; bowel sounds normal; no masses,  no organomegaly Extremities: extremities normal, atraumatic, no cyanosis or edema Pulses: 2+ and symmetric Skin: Skin color, texture, turgor normal. No rashes or lesions Neurologic: Grossly normal Still has left otitis media with no overt drainage of pus. Right tympanic membrane is normal.   Labs on Admission:   [facility] 06/02/11 2258  NA 135  K 2.9*  CL 99  CO2 19  GLUCOSE 127*  BUN 11  CREATININE 1.24*  CALCIUM 8.9  MG --  PHOS --    Basename 06/02/11 2258  WBC 11.1*  NEUTROABS 9.1*  HGB 14.5  HCT 40.3  MCV 84.5  PLT 125*   Radiological  Exams on Admission: Dg Chest 2 View  06/03/2011  *RADIOLOGY REPORT*  Clinical Data: Cough, congestion, body aches.  CHEST - 2 VIEW  Comparison: 06/02/2011 at 2302 hours  Findings: Focal areas of infiltration or atelectasis in the left lung base and right mid lung.  Prominent peribronchial thickening and perihilar streaky opacities bilaterally, but greater on the right side.  Changes likely represent bronchitis with focal areas of consolidation/pneumonia.  Underlying mass lesion is not excluded and follow up after resolution of acute symptoms is recommended. No blunting of costophrenic angles.  No pneumothorax.  Normal heart size and pulmonary vascularity.  IMPRESSION: Perihilar/peribronchial opacities with focal areas of consolidation in the right mid lung and right lower lung.  Changes likely represent bronchitis and pneumonia.  Follow up after resolution is recommended to exclude underlying mass lesion.  Original Report Authenticated By: Marlon Pel, M.D.   Dg Chest Portable 1 View  06/02/2011  *RADIOLOGY REPORT*  Clinical Data: Shortness of breath and congestion.  Emesis and diarrhea.  PORTABLE CHEST - 1 VIEW  Comparison: 06/08/2006  Findings: The heart size and pulmonary vascularity are normal. There are streaky perihilar infiltrates bilaterally but more prominent on the right and in the left lung base.  with suggestion of a focal mass or consolidative area in the right midlung.  These changes are new since the previous study.  Findings may represent multifocal pneumonia.  Mass lesion is not excluded and follow up after resolution of acute symptoms is recommended.  No pneumothorax.  No blunting of costophrenic angles.  IMPRESSION: Bilateral perihilar and basilar opacities, worse on the right.  The right hilum is prominent and obscured.  Changes may represent multifocal pneumonia although a mass lesion is not excluded and follow up after resolution of acute symptoms is recommended.  An upright PA and  lateral view of the chest may be useful in further evaluation of these findings.  Original Report Authenticated By: Marlon Pel, M.D.    Assessment/Plan Present on Admission:  36 year old female with pneumonia despite recent treatment with Augmentin for bilateral to otitis media  .PNA (pneumonia) .Hypoxia .H/O drug abuse .Dehydration .Hypokalemia .Renal insufficiency  Due to the patient's recent antibiotic use she we'll be placed on azithromycin Rocephin and vancomycin. Blood cultures have been sent and going to also order sputum culture. Aggressively manage her with IV fluids and albuterol. I'm also going to check urine drug screen and urinalysis. Further recommendations depending on overall course. Patient's PCP is Dr. Felecia Shelling.    Madiline Saffran A [phone] 06/03/2011, 4:00 AM

## 2011-06-03 NOTE — Progress Notes (Signed)
   CARE MANAGEMENT NOTE 06/03/2011  Patient:  Latasha [name], Latasha [name]   Account Number:  [id number]  Date Initiated:  06/03/2011  Documentation initiated by:  Sharrie Rothman  Subjective/Objective Assessment:   Pt admitted with pneumonia. Pt states that she stays with friends. Pt is diabled.No history of home health and no equipment at home.     Action/Plan:   CM spoke with pt about discharge needs. Pt states she will need a neb machine at discharge. No medication assistance needed.   Anticipated DC Date:  06/10/2011   Anticipated DC Plan:  HOME/SELF CARE      DC Planning Services  CM consult      Choice offered to / List presented to:             Status of service:  In process, will continue to follow Medicare Important Message given?   (If response is "NO", the following Medicare IM given date fields will be blank) Date Medicare IM given:   Date Additional Medicare IM given:    Discharge Disposition:  HOME/SELF CARE  Per UR Regulation:    If discussed at Long Length of Stay Meetings, dates discussed:    Comments:  06/03/11 0844 Arlyss Queen, RN BSN CM Pt admitted with pneumonia. Pt disabled at this time. Will follow for any discharge needs.

## 2011-06-03 NOTE — Consult Note (Signed)
Consult requested by: Dr. Felecia Shelling Consult requested for pneumonia:  HPI: This is a 36 year old who came to the emergency room because of cough congestion fever and chills. She had been treated for bilateral otitis media with Augmentin. She says her ear feels better but she is continuing to have cough and congestion. She says she has a very strong family history of lung disease with COPD and lung cancer. This was in grandparents. She has a personal history of asthma. She has been coughing up green sputum. She is short of breath. She has about a 10-pack-year smoking history and smokes approximately 1/2 package of cigarettes daily and also occasionally smokes marijuana  Past Medical History  Diagnosis Date  . Seizure   . High cholesterol   . ADHD (attention deficit hyperactivity disorder)   . Bipolar disorder   . Asthma      Family History  Problem Relation Age of Onset  . Diabetes Other   . Heart failure Other      History   Social History  . Marital Status: Legally Separated    Spouse Name: N/A    Number of Children: N/A  . Years of Education: N/A   Social History Main Topics  . Smoking status: Current Everyday Smoker -- 0.5 packs/day for 20 years    Types: Cigarettes  . Smokeless tobacco: Never Used  . Alcohol Use: No  . Drug Use: Yes    Special: Marijuana     occasionally  . Sexually Active: Yes    Birth Control/ Protection: Surgical   Other Topics Concern  . None   Social History Narrative  . None     ROS: She denies any chest discomfort. She has not coughed up any blood. She has not lost weight.    Objective: Vital signs in last 24 hours: Temp:  [97.7 F (36.5 C)-99.2 F (37.3 C)] 97.7 F (36.5 C) (04/02 0427) Pulse Rate:  [106-133] 117  (04/02 0427) Resp:  [20-24] 24  (04/02 0427) BP: (107-151)/(57-116) 107/71 mmHg (04/02 0427) SpO2:  [91 %-94 %] 94 % (04/02 0706) Weight:  [81.4 kg (179 lb 7.3 oz)-81.647 kg (180 lb)] 81.4 kg (179 lb 7.3 oz) (04/02  0427) Weight change:  Last BM Date: 06/02/11  Intake/Output from previous day: 04/01 0701 - 04/02 0700 In: 240 [P.O.:240] Out: 800 [Urine:800]  PHYSICAL EXAM She is awake and alert. She looks acutely ill. Her pupils are reactive. Her nose and throat are clear. Her neck is supple. Her chest shows rhonchi bilaterally right more than left. Her heart is regular without gallop. Her abdomen is soft obese without masses. She does not have any edema of the extremities. Her central nervous system examination is grossly intact  Lab Results: Basic Metabolic Panel:  Basename 06/02/11 2258  NA 135  K 2.9*  CL 99  CO2 19  GLUCOSE 127*  BUN 11  CREATININE 1.24*  CALCIUM 8.9  MG --  PHOS --   Liver Function Tests: No results found for this basename: AST:2,ALT:2,ALKPHOS:2,BILITOT:2,PROT:2,ALBUMIN:2 in the last 72 hours No results found for this basename: LIPASE:2,AMYLASE:2 in the last 72 hours No results found for this basename: AMMONIA:2 in the last 72 hours CBC:  Basename 06/02/11 2258  WBC 11.1*  NEUTROABS 9.1*  HGB 14.5  HCT 40.3  MCV 84.5  PLT 125*   Cardiac Enzymes: No results found for this basename: CKTOTAL:3,CKMB:3,CKMBINDEX:3,TROPONINI:3 in the last 72 hours BNP: No results found for this basename: PROBNP:3 in the last 72 hours D-Dimer:  No results found for this basename: DDIMER:2 in the last 72 hours CBG: No results found for this basename: GLUCAP:6 in the last 72 hours Hemoglobin A1C: No results found for this basename: HGBA1C in the last 72 hours Fasting Lipid Panel: No results found for this basename: CHOL,HDL,LDLCALC,TRIG,CHOLHDL,LDLDIRECT in the last 72 hours Thyroid Function Tests: No results found for this basename: TSH,T4TOTAL,FREET4,T3FREE,THYROIDAB in the last 72 hours Anemia Panel: No results found for this basename: VITAMINB12,FOLATE,FERRITIN,TIBC,IRON,RETICCTPCT in the last 72 hours Coagulation: No results found for this basename: LABPROT:2,INR:2 in  the last 72 hours Urine Drug Screen: Drugs of Abuse     Component Value Date/Time   LABOPIA POSITIVE* 06/03/2011 0500   LABOPIA NEGATIVE 10/28/2008 0957   COCAINSCRNUR NONE DETECTED 06/03/2011 0500   COCAINSCRNUR NEGATIVE 10/28/2008 0957   LABBENZ NONE DETECTED 06/03/2011 0500   LABBENZ  Value: POSITIVE (NOTE) Result repeated and verified. Sent for confirmatory testing* 10/28/2008 0957   AMPHETMU NONE DETECTED 06/03/2011 0500   AMPHETMU NEGATIVE 10/28/2008 0957   THCU POSITIVE* 06/03/2011 0500   LABBARB NONE DETECTED 06/03/2011 0500    Alcohol Level: No results found for this basename: ETH:2 in the last 72 hours Urinalysis:  Basename 06/03/11 0500  COLORURINE YELLOW  LABSPEC 1.020  PHURINE 5.5  GLUCOSEU NEGATIVE  HGBUR TRACE*  BILIRUBINUR NEGATIVE  KETONESUR NEGATIVE  PROTEINUR NEGATIVE  UROBILINOGEN 0.2  NITRITE NEGATIVE  LEUKOCYTESUR NEGATIVE   Misc. Labs:   ABGS: No results found for this basename: PHART,PCO2,PO2ART,TCO2,HCO3 in the last 72 hours   MICROBIOLOGY: Recent Results (from the past 240 hour(s))  CULTURE, BLOOD (ROUTINE X 2)     Status: Normal (Preliminary result)   Collection Time   06/03/11  6:32 AM      Component Value Range Status Comment   Specimen Description Blood LEFT HAND   Final    Special Requests NONE 6CC   Final    Culture NO GROWTH <24 HRS   Final    Report Status PENDING   Incomplete   CULTURE, BLOOD (ROUTINE X 2)     Status: Normal (Preliminary result)   Collection Time   06/03/11  7:09 AM      Component Value Range Status Comment   Specimen Description Blood RIGHT HAND   Final    Special Requests NONE 6CC   Final    Culture NO GROWTH <24 HRS   Final    Report Status PENDING   Incomplete     Studies/Results: Dg Chest 2 View  06/03/2011  *RADIOLOGY REPORT*  Clinical Data: Cough, congestion, body aches.  CHEST - 2 VIEW  Comparison: 06/02/2011 at 2302 hours  Findings: Focal areas of infiltration or atelectasis in the left lung base and right mid lung.   Prominent peribronchial thickening and perihilar streaky opacities bilaterally, but greater on the right side.  Changes likely represent bronchitis with focal areas of consolidation/pneumonia.  Underlying mass lesion is not excluded and follow up after resolution of acute symptoms is recommended. No blunting of costophrenic angles.  No pneumothorax.  Normal heart size and pulmonary vascularity.  IMPRESSION: Perihilar/peribronchial opacities with focal areas of consolidation in the right mid lung and right lower lung.  Changes likely represent bronchitis and pneumonia.  Follow up after resolution is recommended to exclude underlying mass lesion.  Original Report Authenticated By: Marlon Pel, M.D.   Dg Chest Portable 1 View  06/02/2011  *RADIOLOGY REPORT*  Clinical Data: Shortness of breath and congestion.  Emesis and diarrhea.  PORTABLE CHEST - 1  VIEW  Comparison: 06/08/2006  Findings: The heart size and pulmonary vascularity are normal. There are streaky perihilar infiltrates bilaterally but more prominent on the right and in the left lung base.  with suggestion of a focal mass or consolidative area in the right midlung.  These changes are new since the previous study.  Findings may represent multifocal pneumonia.  Mass lesion is not excluded and follow up after resolution of acute symptoms is recommended.  No pneumothorax.  No blunting of costophrenic angles.  IMPRESSION: Bilateral perihilar and basilar opacities, worse on the right.  The right hilum is prominent and obscured.  Changes may represent multifocal pneumonia although a mass lesion is not excluded and follow up after resolution of acute symptoms is recommended.  An upright PA and lateral view of the chest may be useful in further evaluation of these findings.  Original Report Authenticated By: Marlon Pel, M.D.    Medications:  Scheduled:   . albuterol  5 mg Nebulization Once  . albuterol      . azithromycin  500 mg Intravenous  Once  . azithromycin  500 mg Intravenous Q24H  . cefTRIAXone (ROCEPHIN)  IV  1 g Intravenous Once  . cefTRIAXone (ROCEPHIN)  IV  1 g Intravenous Q24H  . gabapentin  800 mg Oral QID  . HYDROmorphone      . HYDROmorphone      . ipratropium  0.5 mg Nebulization Once  . ketorolac      . levalbuterol  0.63 mg Nebulization Q6H  . metoCLOPramide  10 mg Intravenous Once  . potassium chloride  40 mEq Oral Once  . pregabalin  100 mg Oral BID  . sertraline  200 mg Oral Daily  . sodium chloride  1,000 mL Intravenous Once  . sodium chloride  1,000 mL Intravenous Once  . vancomycin  1,000 mg Intravenous Once  . vancomycin  1,000 mg Intravenous Q12H  . DISCONTD: albuterol  5 mg Nebulization Once  . DISCONTD: gabapentin  800 mg Oral QID   Continuous:   . 0.9 % NaCl with KCl 20 mEq / L 125 mL/hr at 06/03/11 1308   MVH:[id number], ipratropium, DISCONTD: albuterol, DISCONTD: albuterol, DISCONTD: guaiFENesin  Assesment: She has pneumonia which is multifocal. I agree with triple antibiotic treatment particularly including vancomycin because of the potential that this is staph. I agree with plans for CT. Her potassium is low and is being replaced. Principal Problem:  *PNA (pneumonia) Active Problems:  Hypoxia  H/O drug abuse  Dehydration  Hypokalemia  Renal insufficiency    Plan: Continue triple antibiotic treatment and get a CT of the chest as requested  Thanks for allowing me to see her with you    LOS: 1 day   Tamla Winkels L 06/03/2011, 8:25 AM

## 2011-06-03 NOTE — Progress Notes (Signed)
ANTIBIOTIC CONSULT NOTE  Pharmacy Consult for Vancomycin Indication: rule out pneumonia  Allergies  Allergen Reactions  . Abilify   . Haldol (Haloperidol Decanoate)   . Keflex   . Tegretol (Carbamazepine)   . Zofran     Patient Measurements: Height: 5\' 8"  (172.7 cm) Weight: 179 lb 7.3 oz (81.4 kg) IBW/kg (Calculated) : 63.9   Vital Signs: Temp: 97.7 F (36.5 C) (04/02 0427) Temp src: Oral (04/02 0427) BP: 107/71 mmHg (04/02 0427) Pulse Rate: 117  (04/02 0427) Intake/Output from previous day: 04/01 0701 - 04/02 0700 In: 240 [P.O.:240] Out: 800 [Urine:800] Intake/Output from this shift:    Labs:  [facility] 06/02/11 2258  WBC 11.1*  HGB 14.5  PLT 125*  LABCREA --  CREATININE 1.24*   Estimated Creatinine Clearance: 71.6 ml/min (by C-G formula based on Cr of 1.24). No results found for this basename: VANCOTROUGH:2,VANCOPEAK:2,VANCORANDOM:2,GENTTROUGH:2,GENTPEAK:2,GENTRANDOM:2,TOBRATROUGH:2,TOBRAPEAK:2,TOBRARND:2,AMIKACINPEAK:2,AMIKACINTROU:2,AMIKACIN:2, in the last 72 hours   Microbiology: Recent Results (from the past 720 hour(s))  CULTURE, BLOOD (ROUTINE X 2)     Status: Normal (Preliminary result)   Collection Time   06/03/11  6:32 AM      Component Value Range Status Comment   Specimen Description Blood LEFT HAND   Final    Special Requests NONE 6CC   Final    Culture NO GROWTH <24 HRS   Final    Report Status PENDING   Incomplete   CULTURE, BLOOD (ROUTINE X 2)     Status: Normal (Preliminary result)   Collection Time   06/03/11  7:09 AM      Component Value Range Status Comment   Specimen Description Blood RIGHT HAND   Final    Special Requests NONE 6CC   Final    Culture NO GROWTH <24 HRS   Final    Report Status PENDING   Incomplete     Medical History: Past Medical History  Diagnosis Date  . Seizure   . High cholesterol   . ADHD (attention deficit hyperactivity disorder)   . Bipolar disorder   . Asthma     Medications:  Scheduled:    .  albuterol  5 mg Nebulization Once  . albuterol      . azithromycin  500 mg Intravenous Once  . azithromycin  500 mg Intravenous Q24H  . cefTRIAXone (ROCEPHIN)  IV  1 g Intravenous Once  . cefTRIAXone (ROCEPHIN)  IV  1 g Intravenous Q24H  . gabapentin  800 mg Oral QID  . HYDROmorphone      . HYDROmorphone      . ipratropium  0.5 mg Nebulization Once  . ketorolac      . levalbuterol  0.63 mg Nebulization Q6H  . metoCLOPramide  10 mg Intravenous Once  . potassium chloride  40 mEq Oral Once  . pregabalin  100 mg Oral BID  . sertraline  200 mg Oral Daily  . sodium chloride  1,000 mL Intravenous Once  . sodium chloride  1,000 mL Intravenous Once  . vancomycin  1,000 mg Intravenous Once  . DISCONTD: albuterol  5 mg Nebulization Once  . DISCONTD: gabapentin  800 mg Oral QID   Assessment: Okay for Protocol Received Vancomycin 1000mg  x 1 Allergy noted to Keflex (no reaction listed).  Currently tolerating Rocephin. Recent Augmentin therapy  Goal of Therapy:  Vancomycin trough level 15-20 mcg/ml  Plan: Vancomycin 1000mg  IV every 12 hours. Measure antibiotic drug levels at steady state Follow up culture results  Mady Gemma 06/03/2011,8:16 AM

## 2011-06-03 NOTE — Progress Notes (Signed)
ANTIBIOTIC CONSULT NOTE - INITIAL  Pharmacy Consult for vancomycin Indication: rule out pneumonia  Allergies  Allergen Reactions  . Abilify   . Haldol (Haloperidol Decanoate)   . Keflex   . Tegretol (Carbamazepine)   . Zofran     Patient Measurements: Height: 5\' 8"  (172.7 cm) Weight: 179 lb 7.3 oz (81.4 kg) IBW/kg (Calculated) : 63.9  Adjusted Body Weight: 70 kg  Vital Signs: Temp: 97.7 F (36.5 C) (04/02 0427) Temp src: Oral (04/02 0427) BP: 107/71 mmHg (04/02 0427) Pulse Rate: 117  (04/02 0427) Intake/Output from previous day:   Intake/Output from this shift:    Labs:  Basename 06/02/11 2258  WBC 11.1*  HGB 14.5  PLT 125*  LABCREA --  CREATININE 1.24*   Estimated Creatinine Clearance: 71.6 ml/min (by C-G formula based on Cr of 1.24).   Microbiology: No results found for this or any previous visit (from the past 720 hour(s)).  Medical History: Past Medical History  Diagnosis Date  . Seizure   . High cholesterol   . ADHD (attention deficit hyperactivity disorder)   . Bipolar disorder   . Asthma     Medications:     Rocephin 1gm IV q24h (poor hx of non-specific allergy to cephalexin)    Azithromycin 500mg  IV q24h  Assessment:    Initiation of vancomycin therapy with night supply.  Further pharmacy evaluation to follow during day shift  Goal of Therapy:    Plan:     One time dose of 1gm Vancomycin to initiate therapy with further evaluation and follow-up this morning by Pharmacy  Jem Castro, Shon Baton 06/03/2011,5:18 AM

## 2011-06-03 NOTE — Progress Notes (Signed)
Latasha [name], [name]                ACCOUNT NO.:  [id number]  MEDICAL RECORD NO.:  [id number]  LOCATION:  A306                          FACILITY:  [facility]  PHYSICIAN:  Jamelle Noy D. Felecia Shelling, MD   DATE OF BIRTH:  [birth year]  DATE OF PROCEDURE:  06/03/2011 DATE OF DISCHARGE:                                PROGRESS NOTE   SUBJECTIVE:  The patient complains of recurrent cough with greenish sputum.  She is short of breath and congested.  The patient was admitted last night and started on IV antibiotics for pneumonia.  OBJECTIVE:  GENERAL:  The patient is alert, awake, and acutely sick looking. VITAL SIGNS:  Blood pressure 107/71, pulse 106, respiratory rate 20, temperature 97.7 degrees Fahrenheit. CHEST:  Poor air entry, bilateral rhonchi and crackles at the base. CARDIOVASCULAR:  First and second heart sounds heard.  No murmur.  No gallop. ABDOMEN:  Soft and lax.  Bowel sound is positive.  No mass or organomegaly. EXTREMITIES:  No leg edema.  LABORATORY DATA:  CBC:  WBC 11.1, hemoglobin 14.2, hematocrit 40.3, and platelets 125.  Sodium 135, potassium 2.9, chloride 99, carbon dioxide 19, glucose 127, BUN 11, creatinine 1.24.  Chest x-ray showed perihilar and peribronchial opacities with focal area of consolidation over the right mid lung and right lower lung.  It may likely represent bronchitis pneumonia.  Followup is recommended to exclude tiny underlining lesion.  ASSESSMENT: 1. Probably community-acquired pneumonia, to rule out underlying lung     mass. 2. History of tobacco abuse. 3. History of bronchial asthma. 4. Bipolar disease. 5. Seizure disorder. 6. Hypokalemia. 7. Renal insufficiency.  PLAN:  We will continue the patient on IV antibiotics.  We will start the patient on Xopenex and Atrovent inhaler.  We will do pulmonary consult.  We will do CT scan of the chest.  We will continue her regular treatment.    Latasha [name] D. Felecia Shelling, MD    TDF/MEDQ  D:  06/03/2011  T:   06/03/2011  Job:  [id number]

## 2011-06-04 LAB — DIFFERENTIAL
Basophils Absolute: 0.1 10*3/uL (ref 0.0–0.1)
Basophils Relative: 1 % (ref 0–1)
Eosinophils Absolute: 0 10*3/uL (ref 0.0–0.7)
Eosinophils Relative: 0 % (ref 0–5)
Lymphocytes Relative: 24 % (ref 12–46)
Monocytes Absolute: 0.4 10*3/uL (ref 0.1–1.0)

## 2011-06-04 LAB — BASIC METABOLIC PANEL
BUN: 3 mg/dL — ABNORMAL LOW (ref 6–23)
CO2: 22 mEq/L (ref 19–32)
Calcium: 8.5 mg/dL (ref 8.4–10.5)
Creatinine, Ser: 0.58 mg/dL (ref 0.50–1.10)

## 2011-06-04 LAB — CBC
HCT: 32.6 % — ABNORMAL LOW (ref 36.0–46.0)
MCH: 30.3 pg (ref 26.0–34.0)
MCHC: 35 g/dL (ref 30.0–36.0)
MCV: 86.7 fL (ref 78.0–100.0)
Platelets: 153 10*3/uL (ref 150–400)
RDW: 13.6 % (ref 11.5–15.5)
WBC: 10.6 10*3/uL — ABNORMAL HIGH (ref 4.0–10.5)

## 2011-06-04 LAB — LEGIONELLA ANTIGEN, URINE

## 2011-06-04 MED ORDER — POTASSIUM CHLORIDE CRYS ER 20 MEQ PO TBCR
40.0000 meq | EXTENDED_RELEASE_TABLET | Freq: Once | ORAL | Status: AC
  Administered 2011-06-04: 40 meq via ORAL
  Filled 2011-06-04: qty 2

## 2011-06-04 MED ORDER — NICOTINE 21 MG/24HR TD PT24
21.0000 mg | MEDICATED_PATCH | Freq: Every day | TRANSDERMAL | Status: DC
  Administered 2011-06-04 – 2011-06-08 (×5): 21 mg via TRANSDERMAL
  Filled 2011-06-04 (×5): qty 1

## 2011-06-04 NOTE — Progress Notes (Signed)
Subjective: She says she feels better. She has no new complaints.  Objective: Vital signs in last 24 hours: Temp:  [98.6 F (37 C)-99.9 F (37.7 C)] 98.7 F (37.1 C) (04/03 0627) Pulse Rate:  [86-106] 86  (04/03 0627) Resp:  [18-20] 18  (04/03 0627) BP: (91-114)/(57-72) 91/57 mmHg (04/03 0627) SpO2:  [86 %-98 %] 98 % (04/03 0651) Weight change:  Last BM Date: 06/02/11  Intake/Output from previous day: 04/02 0701 - 04/03 0700 In: 4203.8 [P.O.:1540; I.V.:1963.8; IV Piggyback:700] Out: 6075 [Urine:6075]  PHYSICAL EXAM General appearance: alert, cooperative and no distress Resp: rhonchi bilaterally Cardio: regular rate and rhythm, S1, S2 normal, no murmur, click, rub or gallop GI: soft, non-tender; bowel sounds normal; no masses,  no organomegaly Extremities: extremities normal, atraumatic, no cyanosis or edema  Lab Results:    Basic Metabolic Panel:  Basename 06/04/11 0523 06/02/11 2258  NA 136 135  K 3.0* 2.9*  CL 103 99  CO2 22 19  GLUCOSE 130* 127*  BUN 3* 11  CREATININE 0.58 1.24*  CALCIUM 8.5 8.9  MG -- --  PHOS -- --   Liver Function Tests: No results found for this basename: AST:2,ALT:2,ALKPHOS:2,BILITOT:2,PROT:2,ALBUMIN:2 in the last 72 hours No results found for this basename: LIPASE:2,AMYLASE:2 in the last 72 hours No results found for this basename: AMMONIA:2 in the last 72 hours CBC:  Basename 06/04/11 0523 06/02/11 2258  WBC 10.6* 11.1*  NEUTROABS 7.7 9.1*  HGB 11.4* 14.5  HCT 32.6* 40.3  MCV 86.7 84.5  PLT 153 125*   Cardiac Enzymes: No results found for this basename: CKTOTAL:3,CKMB:3,CKMBINDEX:3,TROPONINI:3 in the last 72 hours BNP: No results found for this basename: PROBNP:3 in the last 72 hours D-Dimer: No results found for this basename: DDIMER:2 in the last 72 hours CBG: No results found for this basename: GLUCAP:6 in the last 72 hours Hemoglobin A1C: No results found for this basename: HGBA1C in the last 72 hours Fasting Lipid  Panel: No results found for this basename: CHOL,HDL,LDLCALC,TRIG,CHOLHDL,LDLDIRECT in the last 72 hours Thyroid Function Tests: No results found for this basename: TSH,T4TOTAL,FREET4,T3FREE,THYROIDAB in the last 72 hours Anemia Panel: No results found for this basename: VITAMINB12,FOLATE,FERRITIN,TIBC,IRON,RETICCTPCT in the last 72 hours Coagulation: No results found for this basename: LABPROT:2,INR:2 in the last 72 hours Urine Drug Screen: Drugs of Abuse     Component Value Date/Time   LABOPIA POSITIVE* 06/03/2011 0500   LABOPIA NEGATIVE 10/28/2008 0957   COCAINSCRNUR NONE DETECTED 06/03/2011 0500   COCAINSCRNUR NEGATIVE 10/28/2008 0957   LABBENZ NONE DETECTED 06/03/2011 0500   LABBENZ  Value: POSITIVE (NOTE) Result repeated and verified. Sent for confirmatory testing* 10/28/2008 0957   AMPHETMU NONE DETECTED 06/03/2011 0500   AMPHETMU NEGATIVE 10/28/2008 0957   THCU POSITIVE* 06/03/2011 0500   LABBARB NONE DETECTED 06/03/2011 0500    Alcohol Level: No results found for this basename: ETH:2 in the last 72 hours Urinalysis:  Basename 06/03/11 0500  COLORURINE YELLOW  LABSPEC 1.020  PHURINE 5.5  GLUCOSEU NEGATIVE  HGBUR TRACE*  BILIRUBINUR NEGATIVE  KETONESUR NEGATIVE  PROTEINUR NEGATIVE  UROBILINOGEN 0.2  NITRITE NEGATIVE  LEUKOCYTESUR NEGATIVE   Misc. Labs:  ABGS No results found for this basename: PHART,PCO2,PO2ART,TCO2,HCO3 in the last 72 hours CULTURES Recent Results (from the past 240 hour(s))  CULTURE, SPUTUM-ASSESSMENT     Status: Normal   Collection Time   06/03/11  5:05 AM      Component Value Range Status Comment   Specimen Description SPU   Final    Special  Requests NONE   Final    Sputum evaluation     Final    Value: THIS SPECIMEN IS ACCEPTABLE. RESPIRATORY CULTURE REPORT TO FOLLOW.   Report Status 06/03/2011 FINAL   Final   CULTURE, RESPIRATORY     Status: Normal (Preliminary result)   Collection Time   06/03/11  5:05 AM      Component Value Range Status Comment    Specimen Description SPUTUM   Final    Special Requests NONE   Final    Gram Stain     Final    Value: FEW WBC PRESENT, PREDOMINANTLY PMN     NO SQUAMOUS EPITHELIAL CELLS SEEN     NO ORGANISMS SEEN   Culture NORMAL OROPHARYNGEAL FLORA   Final    Report Status PENDING   Incomplete   CULTURE, BLOOD (ROUTINE X 2)     Status: Normal (Preliminary result)   Collection Time   06/03/11  6:32 AM      Component Value Range Status Comment   Specimen Description Blood LEFT HAND   Final    Special Requests NONE 6CC   Final    Culture NO GROWTH <24 HRS   Final    Report Status PENDING   Incomplete   CULTURE, BLOOD (ROUTINE X 2)     Status: Normal (Preliminary result)   Collection Time   06/03/11  7:09 AM      Component Value Range Status Comment   Specimen Description Blood RIGHT HAND   Final    Special Requests NONE 6CC   Final    Culture NO GROWTH <24 HRS   Final    Report Status PENDING   Incomplete    Studies/Results: Dg Chest 2 View  06/03/2011  *RADIOLOGY REPORT*  Clinical Data: Cough, congestion, body aches.  CHEST - 2 VIEW  Comparison: 06/02/2011 at 2302 hours  Findings: Focal areas of infiltration or atelectasis in the left lung base and right mid lung.  Prominent peribronchial thickening and perihilar streaky opacities bilaterally, but greater on the right side.  Changes likely represent bronchitis with focal areas of consolidation/pneumonia.  Underlying mass lesion is not excluded and follow up after resolution of acute symptoms is recommended. No blunting of costophrenic angles.  No pneumothorax.  Normal heart size and pulmonary vascularity.  IMPRESSION: Perihilar/peribronchial opacities with focal areas of consolidation in the right mid lung and right lower lung.  Changes likely represent bronchitis and pneumonia.  Follow up after resolution is recommended to exclude underlying mass lesion.  Original Report Authenticated By: Marlon Pel, M.D.   Ct Chest W Contrast  06/03/2011   *RADIOLOGY REPORT*  Clinical Data: Abnormal chest x-ray.  Cough and chest congestion.  CT CHEST WITH CONTRAST  Technique:  Multidetector CT imaging of the chest was performed following the standard protocol during bolus administration of intravenous contrast.  Contrast:  80 ml of Omnipaque-300  Comparison: Chest x-ray dated 06/03/2011 and chest CT dated 10/05/2004  Findings: There are extensive bilateral centrilobular ground-glass pulmonary infiltrates with a small focal area of consolidation in the right upper lobe.  There is also bilateral hilar adenopathy with slight mediastinal adenopathy.  Heart size is normal.  No effusions.  No osseous abnormality. Visualized portion of the upper abdomen is normal.  IMPRESSION: Extensive bilateral primarily ground-glass pulmonary infiltrates. The pattern is nonspecific but often associated with sarcoidosis, eosinophilic pneumonia, bronchiolitis, and allergic alveolitis.  Original Report Authenticated By: Gwynn Burly, M.D.   Dg Chest Portable 1 View  06/02/2011  *RADIOLOGY REPORT*  Clinical Data: Shortness of breath and congestion.  Emesis and diarrhea.  PORTABLE CHEST - 1 VIEW  Comparison: 06/08/2006  Findings: The heart size and pulmonary vascularity are normal. There are streaky perihilar infiltrates bilaterally but more prominent on the right and in the left lung base.  with suggestion of a focal mass or consolidative area in the right midlung.  These changes are new since the previous study.  Findings may represent multifocal pneumonia.  Mass lesion is not excluded and follow up after resolution of acute symptoms is recommended.  No pneumothorax.  No blunting of costophrenic angles.  IMPRESSION: Bilateral perihilar and basilar opacities, worse on the right.  The right hilum is prominent and obscured.  Changes may represent multifocal pneumonia although a mass lesion is not excluded and follow up after resolution of acute symptoms is recommended.  An upright PA and  lateral view of the chest may be useful in further evaluation of these findings.  Original Report Authenticated By: Marlon Pel, M.D.    Medications:  Scheduled:   . azithromycin  500 mg Intravenous Q24H  . cefTRIAXone (ROCEPHIN)  IV  1 g Intravenous Q24H  . gabapentin  800 mg Oral QID  . levalbuterol  0.63 mg Nebulization Q6H  . potassium chloride  40 mEq Oral Once  . pregabalin  100 mg Oral BID  . risperiDONE  3 mg Oral BID  . sertraline  200 mg Oral Daily  . vancomycin  1,000 mg Intravenous Q12H   Continuous:   . 0.9 % NaCl with KCl 20 mEq / L 75 mL/hr at 06/03/11 1003   UUV:[id number]-GLOVFIEPPIR, ibuprofen, iohexol, ipratropium, promethazine  Assesment: She has pneumonia. Her CT was suggestive that she might have something like sarcoidosis but could be multifocal pneumonia as well. Clinically she appeared to have pneumonia and I think at this point that's what we need to treat. She did have evaluation of her other abnormalities as an outpatient once she is better Principal Problem:  *PNA (pneumonia) Active Problems:  Hypoxia  H/O drug abuse  Dehydration  Hypokalemia  Renal insufficiency    Plan: Continue with current treatments for pneumonia.    LOS: 2 days   Rory Xiang L 06/04/2011, 8:40 AM

## 2011-06-04 NOTE — Progress Notes (Signed)
ANTIBIOTIC CONSULT NOTE  Pharmacy Consult for Vancomycin Indication: rule out pneumonia  Allergies  Allergen Reactions  . Abilify   . Haldol (Haloperidol Decanoate)   . Keflex   . Tegretol (Carbamazepine)   . Zofran    Patient Measurements: Height: 5\' 8"  (172.7 cm) Weight: 179 lb 7.3 oz (81.4 kg) IBW/kg (Calculated) : 63.9   Vital Signs: Temp: 98.7 F (37.1 C) (04/03 0627) Temp src: Oral (04/03 0627) BP: 91/57 mmHg (04/03 0627) Pulse Rate: 86  (04/03 0627) Intake/Output from previous day: 04/02 0701 - 04/03 0700 In: 4203.8 [P.O.:1540; I.V.:1963.8; IV Piggyback:700] Out: 6075 [Urine:6075] Intake/Output from this shift:    Labs:  [facility] 06/04/11 0523 06/02/11 2258  WBC 10.6* 11.1*  HGB 11.4* 14.5  PLT 153 125*  LABCREA -- --  CREATININE 0.58 1.24*   Estimated Creatinine Clearance: 110.9 ml/min (by C-G formula based on Cr of 0.58). No results found for this basename: VANCOTROUGH:2,VANCOPEAK:2,VANCORANDOM:2,GENTTROUGH:2,GENTPEAK:2,GENTRANDOM:2,TOBRATROUGH:2,TOBRAPEAK:2,TOBRARND:2,AMIKACINPEAK:2,AMIKACINTROU:2,AMIKACIN:2, in the last 72 hours   Microbiology: Recent Results (from the past 720 hour(s))  CULTURE, SPUTUM-ASSESSMENT     Status: Normal   Collection Time   06/03/11  5:05 AM      Component Value Range Status Comment   Specimen Description SPU   Final    Special Requests NONE   Final    Sputum evaluation     Final    Value: THIS SPECIMEN IS ACCEPTABLE. RESPIRATORY CULTURE REPORT TO FOLLOW.   Report Status 06/03/2011 FINAL   Final   CULTURE, RESPIRATORY     Status: Normal (Preliminary result)   Collection Time   06/03/11  5:05 AM      Component Value Range Status Comment   Specimen Description SPUTUM   Final    Special Requests NONE   Final    Gram Stain     Final    Value: FEW WBC PRESENT, PREDOMINANTLY PMN     NO SQUAMOUS EPITHELIAL CELLS SEEN     NO ORGANISMS SEEN   Culture PENDING   Incomplete    Report Status PENDING   Incomplete   CULTURE, BLOOD  (ROUTINE X 2)     Status: Normal (Preliminary result)   Collection Time   06/03/11  6:32 AM      Component Value Range Status Comment   Specimen Description Blood LEFT HAND   Final    Special Requests NONE 6CC   Final    Culture NO GROWTH <24 HRS   Final    Report Status PENDING   Incomplete   CULTURE, BLOOD (ROUTINE X 2)     Status: Normal (Preliminary result)   Collection Time   06/03/11  7:09 AM      Component Value Range Status Comment   Specimen Description Blood RIGHT HAND   Final    Special Requests NONE 6CC   Final    Culture NO GROWTH <24 HRS   Final    Report Status PENDING   Incomplete    Medical History: Past Medical History  Diagnosis Date  . Seizure   . High cholesterol   . ADHD (attention deficit hyperactivity disorder)   . Bipolar disorder   . Asthma    Medications:  Scheduled:     . azithromycin  500 mg Intravenous Q24H  . cefTRIAXone (ROCEPHIN)  IV  1 g Intravenous Q24H  . gabapentin  800 mg Oral QID  . levalbuterol  0.63 mg Nebulization Q6H  . potassium chloride  40 mEq Oral Once  . pregabalin  100 mg  Oral BID  . risperiDONE  3 mg Oral BID  . sertraline  200 mg Oral Daily  . vancomycin  1,000 mg Intravenous Q12H   Assessment: Okay for Protocol Received Vancomycin 1000mg  x 1 Allergy noted to Keflex (no reaction listed).  Currently tolerating Rocephin. Recent Augmentin therapy  Goal of Therapy:  Vancomycin trough level 15-20 mcg/ml  Plan: Vancomycin 1000mg  IV every 12 hours. Check trough tomorrow F/U cultures as available  Valrie Hart A 06/04/2011,8:14 AM

## 2011-06-04 NOTE — Progress Notes (Signed)
Latasha [name], Latasha [name]                ACCOUNT NO.:  [id number]  MEDICAL RECORD NO.:  [id number]  LOCATION:  A306                          FACILITY:  [facility]  PHYSICIAN:  Freeda Spivey D. Felecia Shelling, MD   DATE OF BIRTH:  23-Apr-1975  DATE OF PROCEDURE:  06/04/2011 DATE OF DISCHARGE:                                PROGRESS NOTE   SUBJECTIVE:  The patient feels better today.  Her cough and congestion is less.  Continues to be short of breath.  OBJECTIVE:  GENERAL:  The patient is alert, awake, sick looking. VITAL SIGNS:  Blood pressure 91/57, pulse 85, respiratory rate 18, temperature 98 degrees Fahrenheit. CHEST:  Decreased air entry, bilateral rhonchi and crackles at the base. CARDIOVASCULAR:  First and second heart sounds heard.  No murmur.  No gallop. ABDOMEN:  Soft and lax.  Bowel sound is positive.  No mass or organomegaly.  EXTREMITIES:  No leg edema.  LABORATORY DATA:  Sodium 136, potassium 3.0, chloride 103, carbon dioxide 22, glucose 130, BUN 3, creatinine 0.58, and calcium 8.5.  CBC: WBC 13.6, hemoglobin 11.4, hematocrit 32.6, and platelets 153,000.  CT scan of the chest showed extensive bilateral primarily ground glass pulmonary infiltrates.  ASSESSMENT: 1. Probably bilateral pneumonia. 2. Hypokalemia. 3. Rule out sarcoidosis. 4. History of bronchial asthma. 5. Bipolar disease. 6. Seizure disorder.  PLAN:  We will continue the patient on a combination of IV antibiotics. Pulmonary consult is appreciated.  We will supplement potassium and we will continue current treatment and supportive care.     Kenny Stern D. Felecia Shelling, MD     TDF/MEDQ  D:  06/04/2011  T:  06/04/2011  Job:  [id number]

## 2011-06-05 LAB — BASIC METABOLIC PANEL
Calcium: 8.7 mg/dL (ref 8.4–10.5)
GFR calc Af Amer: >90 mL/min (ref >90)
GFR calc non Af Amer: >90 mL/min (ref >90)
Glucose, Bld: 130 mg/dL — ABNORMAL HIGH (ref 70–99)
Potassium: 3.7 mEq/L (ref 3.5–5.1)
Sodium: 138 mEq/L (ref 135–145)

## 2011-06-05 LAB — CBC
MCH: 30.6 pg (ref 26.0–34.0)
MCHC: 34.9 g/dL (ref 30.0–36.0)
Platelets: 182 10*3/uL (ref 150–400)
RDW: 13.7 % (ref 11.5–15.5)

## 2011-06-05 LAB — CULTURE, RESPIRATORY W GRAM STAIN

## 2011-06-05 LAB — VANCOMYCIN, TROUGH: Vancomycin Tr: <5 ug/mL — ABNORMAL LOW (ref 10.0–20.0)

## 2011-06-05 MED ORDER — LEVALBUTEROL HCL 0.63 MG/3ML IN NEBU
0.6300 mg | INHALATION_SOLUTION | Freq: Three times a day (TID) | RESPIRATORY_TRACT | Status: DC
  Administered 2011-06-06 – 2011-06-09 (×10): 0.63 mg via RESPIRATORY_TRACT
  Filled 2011-06-05 (×10): qty 3

## 2011-06-05 MED ORDER — ALUM & MAG HYDROXIDE-SIMETH 200-200-20 MG/5ML PO SUSP
30.0000 mL | ORAL | Status: DC | PRN
  Administered 2011-06-05: 30 mL via ORAL
  Filled 2011-06-05: qty 30

## 2011-06-05 MED ORDER — VANCOMYCIN HCL IN DEXTROSE 1-5 GM/200ML-% IV SOLN
1000.0000 mg | Freq: Three times a day (TID) | INTRAVENOUS | Status: DC
  Administered 2011-06-05 – 2011-06-06 (×4): 1000 mg via INTRAVENOUS
  Filled 2011-06-05 (×12): qty 200

## 2011-06-05 NOTE — Progress Notes (Signed)
NAMELYLIANA, DICENSO                ACCOUNT NO.:  [id number]  MEDICAL RECORD NO.:  [id number]  LOCATION:  A306                          FACILITY:  [facility]  PHYSICIAN:  Jomar Denz D. Felecia Shelling, MD   DATE OF BIRTH:  [birth year]  DATE OF PROCEDURE:  06/05/2011 DATE OF DISCHARGE:                                PROGRESS NOTE   SUBJECTIVE:  The patient complains of cough and shortness of breath. Cough is recurrent and she has associated headache.  No fever or chills.  OBJECTIVE:  GENERAL/VITAL SIGNS:  Patient is alert, awake, acutely sick- looking with vitals blood pressure 102/70, pulse 79, respiratory rate 18, temperature 97.5 degrees Fahrenheit. CHEST:  Poor air entry, bilateral rhonchi and crackles at the base. CARDIOVASCULAR SYSTEM:  First and second heart sounds heard.  No murmur, no gallop. ABDOMEN:  Soft and lax.  Bowel sound is positive.  No mass or organomegaly. EXTREMITIES:  No leg edema.  ASSESSMENT: 1. Bilateral pneumonia. 2. Rule out sarcoidosis. 3. Bronchial asthma. 4. Bipolar disease. 5. Seizure disorder. 6. Hypokalemia, clinically improved.  PLAN:  We will continue on IV antibiotics at this time.  I have discussed with Dr. Juanetta Gosling and he is planning for bronchoscopy probably after patient improved in outpatient and we will continue her regular treatment.     Kimari Lienhard D. Felecia Shelling, MD     TDF/MEDQ  D:  06/05/2011  T:  06/05/2011  Job:  [id number]

## 2011-06-05 NOTE — Progress Notes (Signed)
ANTIBIOTIC CONSULT NOTE  Pharmacy Consult for Vancomycin Indication: rule out pneumonia  Allergies  Allergen Reactions  . Abilify   . Haldol (Haloperidol Decanoate)   . Keflex   . Tegretol (Carbamazepine)   . Zofran    Patient Measurements: Height: 5\' 8"  (172.7 cm) Weight: 179 lb 7.3 oz (81.4 kg) IBW/kg (Calculated) : 63.9   Vital Signs: Temp: 98.2 F (36.8 C) (04/04 0550) BP: 116/75 mmHg (04/04 0550) Pulse Rate: 89  (04/04 0550) Intake/Output from previous day: 04/03 0701 - 04/04 0700 In: 2673.8 [P.O.:600; I.V.:1773.8; IV Piggyback:300] Out: 1700 [Urine:1700] Intake/Output from this shift: Total I/O In: 120 [P.O.:120] Out: -   Labs:  Basename 06/05/11 1017 06/04/11 0523 06/02/11 2258  WBC 10.3 10.6* 11.1*  HGB 11.0* 11.4* 14.5  PLT 182 153 125*  LABCREA -- -- --  CREATININE 0.55 0.58 1.24*   Estimated Creatinine Clearance: 110.9 ml/min (by C-G formula based on Cr of 0.55).  Basename 06/05/11 0616  VANCOTROUGH <5.0*  VANCOPEAK --  Drue Dun --  GENTTROUGH --  GENTPEAK --  GENTRANDOM --  TOBRATROUGH --  TOBRAPEAK --  TOBRARND --  AMIKACINPEAK --  AMIKACINTROU --  AMIKACIN --    Microbiology: Recent Results (from the past 720 hour(s))  CULTURE, SPUTUM-ASSESSMENT     Status: Normal   Collection Time   06/03/11  5:05 AM      Component Value Range Status Comment   Specimen Description SPU   Final    Special Requests NONE   Final    Sputum evaluation     Final    Value: THIS SPECIMEN IS ACCEPTABLE. RESPIRATORY CULTURE REPORT TO FOLLOW.   Report Status 06/03/2011 FINAL   Final   CULTURE, RESPIRATORY     Status: Normal   Collection Time   06/03/11  5:05 AM      Component Value Range Status Comment   Specimen Description SPUTUM   Final    Special Requests NONE   Final    Gram Stain     Final    Value: FEW WBC PRESENT, PREDOMINANTLY PMN     NO SQUAMOUS EPITHELIAL CELLS SEEN     NO ORGANISMS SEEN   Culture NORMAL OROPHARYNGEAL FLORA   Final    Report  Status 06/05/2011 FINAL   Final   CULTURE, BLOOD (ROUTINE X 2)     Status: Normal (Preliminary result)   Collection Time   06/03/11  6:32 AM      Component Value Range Status Comment   Specimen Description Blood LEFT HAND   Final    Special Requests NONE 6CC   Final    Culture NO GROWTH <24 HRS   Final    Report Status PENDING   Incomplete   CULTURE, BLOOD (ROUTINE X 2)     Status: Normal (Preliminary result)   Collection Time   06/03/11  7:09 AM      Component Value Range Status Comment   Specimen Description Blood RIGHT HAND   Final    Special Requests NONE 6CC   Final    Culture NO GROWTH <24 HRS   Final    Report Status PENDING   Incomplete    Medical History: Past Medical History  Diagnosis Date  . Seizure   . High cholesterol   . ADHD (attention deficit hyperactivity disorder)   . Bipolar disorder   . Asthma    Medications:  Scheduled:     . azithromycin  500 mg Intravenous Q24H  . cefTRIAXone (ROCEPHIN)  IV  1 g Intravenous Q24H  . gabapentin  800 mg Oral QID  . levalbuterol  0.63 mg Nebulization Q6H  . nicotine  21 mg Transdermal Daily  . pregabalin  100 mg Oral BID  . risperiDONE  3 mg Oral BID  . sertraline  200 mg Oral Daily  . vancomycin  1,000 mg Intravenous Q12H   Assessment: Okay for Protocol Trough level below goal Excellent renal fxn Excellent Vancomycin clearance  Goal of Therapy:  Vancomycin trough level 15-20 mcg/ml  Plan: Increase Vancomycin 1000mg  IV every 8 hours. Re-Check trough tomorrow F/U cultures as available Monitor renal fxn, labs per protocol  Valrie Hart A 06/05/2011,11:48 AM

## 2011-06-06 ENCOUNTER — Inpatient Hospital Stay (HOSPITAL_COMMUNITY): Payer: PRIVATE HEALTH INSURANCE

## 2011-06-06 LAB — BASIC METABOLIC PANEL
BUN: 4 mg/dL — ABNORMAL LOW (ref 6–23)
Chloride: 107 mEq/L (ref 96–112)
Creatinine, Ser: 0.55 mg/dL (ref 0.50–1.10)
GFR calc Af Amer: >90 mL/min (ref >90)
GFR calc non Af Amer: >90 mL/min (ref >90)
Potassium: 4 mEq/L (ref 3.5–5.1)

## 2011-06-06 LAB — VANCOMYCIN, TROUGH: Vancomycin Tr: 9.7 ug/mL — ABNORMAL LOW (ref 10.0–20.0)

## 2011-06-06 MED ORDER — METHYLPREDNISOLONE SODIUM SUCC 40 MG IJ SOLR
40.0000 mg | Freq: Four times a day (QID) | INTRAMUSCULAR | Status: DC
  Administered 2011-06-06 – 2011-06-08 (×9): 40 mg via INTRAVENOUS
  Filled 2011-06-06 (×9): qty 1

## 2011-06-06 MED ORDER — VANCOMYCIN HCL 1000 MG IV SOLR
1250.0000 mg | Freq: Three times a day (TID) | INTRAVENOUS | Status: DC
  Administered 2011-06-06 – 2011-06-09 (×8): 1250 mg via INTRAVENOUS
  Filled 2011-06-06 (×8): qty 1250

## 2011-06-06 NOTE — Progress Notes (Signed)
Patient was complaining of heartburn, doctor was notified and new orders given for Maalox. When putting the order in, a contraindication came up, so pharmacist was notified and stated that it would be okay to give. Also, patient stated to RN that she takes Maalox at home. Will continue to monitor.

## 2011-06-06 NOTE — Progress Notes (Signed)
NAMEJANIYHA, Latasha [name]                ACCOUNT NO.:  [id number]  MEDICAL RECORD NO.:  [id number]  LOCATION:  A306                          FACILITY:  [facility]  PHYSICIAN:  Cashmere Harmes D. Felecia Shelling, MD   DATE OF BIRTH:  [birth year]  DATE OF PROCEDURE:  06/06/2011 DATE OF DISCHARGE:                                PROGRESS NOTE   SUBJECTIVE:  The patient feels slightly better.  However, she is still coughing and congested.  Her cough is productive of brownish sputum.  No fever or chills.  OBJECTIVE:  GENERAL:  The patient is alert, awake, sick looking. VITAL SIGNS:  Blood pressure 110/69, pulse 76, respiratory rate 17, temperature 97.5 degrees Fahrenheit. CHEST:  Decreased air entry, bilateral wheezes, rhonchi, and crackles at the base. CARDIOVASCULAR SYSTEM:  First and second heart sounds heard.  No murmur. No gallop. ABDOMEN:  Soft and lax.  Bowel sounds positive.  No mass or organomegaly. EXTREMITIES: No leg edema.  LABS:  BMP:  Sodium 140, potassium 4.0, chloride 107, carbon dioxide 25, glucose 90, BUN 4 creatinine 0.55, calcium 8.7.  CBC:  WBC 10.3, hemoglobin 11.0, hematocrit 31.5, and platelets 182.  ASSESSMENTS: 1. Bilateral pneumonia. 2. Probably sarcoidosis. 3. History of asthma. 4. Bipolar disease. 5. Seizure disorder.  PLAN:  I have discussed her progress with Dr. Juanetta Gosling.  We have planned to repeat chest x-ray and see any improvement on the x-ray.  In the meantime, agreed to continue IV antibiotics and current treatment.     Braelynn Lupton D. Felecia Shelling, MD     TDF/MEDQ  D:  06/06/2011  T:  06/06/2011  Job:  [id number]

## 2011-06-06 NOTE — Progress Notes (Signed)
ANTIBIOTIC CONSULT NOTE  Pharmacy Consult for Vancomycin Indication: rule out pneumonia  Allergies  Allergen Reactions  . Abilify   . Haldol (Haloperidol Decanoate)   . Keflex   . Tegretol (Carbamazepine)   . Zofran    Patient Measurements: Height: 5\' 8"  (172.7 cm) Weight: 179 lb 7.3 oz (81.4 kg) IBW/kg (Calculated) : 63.9   Vital Signs: Temp: 97.6 F (36.4 C) (04/05 1328) BP: 130/83 mmHg (04/05 1328) Pulse Rate: 77  (04/05 1328) Intake/Output from previous day: 04/04 0701 - 04/05 0700 In: 5066.8 [P.O.:600; I.V.:4166.8; IV Piggyback:300] Out: 2100 [Urine:2100] Intake/Output from this shift: Total I/O In: 360 [P.O.:360] Out: 1850 [Urine:1850]  Labs:  [facility] 06/06/11 0501 06/05/11 1017 06/04/11 0523  WBC -- 10.3 10.6*  HGB -- 11.0* 11.4*  PLT -- 182 153  LABCREA -- -- --  CREATININE 0.55 0.55 0.58   Estimated Creatinine Clearance: 110.9 ml/min (by C-G formula based on Cr of 0.55).  Basename 06/06/11 1338 06/05/11 0616  VANCOTROUGH 9.7* <5.0*  VANCOPEAK -- --  Drue Dun -- --  GENTTROUGH -- --  GENTPEAK -- --  GENTRANDOM -- --  TOBRATROUGH -- --  TOBRAPEAK -- --  TOBRARND -- --  AMIKACINPEAK -- --  AMIKACINTROU -- --  AMIKACIN -- --    Microbiology: Recent Results (from the past 720 hour(s))  CULTURE, SPUTUM-ASSESSMENT     Status: Normal   Collection Time   06/03/11  5:05 AM      Component Value Range Status Comment   Specimen Description SPU   Final    Special Requests NONE   Final    Sputum evaluation     Final    Value: THIS SPECIMEN IS ACCEPTABLE. RESPIRATORY CULTURE REPORT TO FOLLOW.   Report Status 06/03/2011 FINAL   Final   CULTURE, RESPIRATORY     Status: Normal   Collection Time   06/03/11  5:05 AM      Component Value Range Status Comment   Specimen Description SPUTUM   Final    Special Requests NONE   Final    Gram Stain     Final    Value: FEW WBC PRESENT, PREDOMINANTLY PMN     NO SQUAMOUS EPITHELIAL CELLS SEEN     NO ORGANISMS  SEEN   Culture NORMAL OROPHARYNGEAL FLORA   Final    Report Status 06/05/2011 FINAL   Final   CULTURE, BLOOD (ROUTINE X 2)     Status: Normal (Preliminary result)   Collection Time   06/03/11  6:32 AM      Component Value Range Status Comment   Specimen Description Blood LEFT HAND   Final    Special Requests NONE 6CC   Final    Culture NO GROWTH 3 DAYS   Final    Report Status PENDING   Incomplete   CULTURE, BLOOD (ROUTINE X 2)     Status: Normal (Preliminary result)   Collection Time   06/03/11  7:09 AM      Component Value Range Status Comment   Specimen Description Blood RIGHT HAND   Final    Special Requests NONE 6CC   Final    Culture NO GROWTH 3 DAYS   Final    Report Status PENDING   Incomplete    Medical History: Past Medical History  Diagnosis Date  . Seizure   . High cholesterol   . ADHD (attention deficit hyperactivity disorder)   . Bipolar disorder   . Asthma    Medications:  Scheduled:     .  azithromycin  500 mg Intravenous Q24H  . cefTRIAXone (ROCEPHIN)  IV  1 g Intravenous Q24H  . gabapentin  800 mg Oral QID  . levalbuterol  0.63 mg Nebulization TID  . methylPREDNISolone (SOLU-MEDROL) injection  40 mg Intravenous Q6H  . nicotine  21 mg Transdermal Daily  . pregabalin  100 mg Oral BID  . risperiDONE  3 mg Oral BID  . sertraline  200 mg Oral Daily  . vancomycin  1,250 mg Intravenous Q8H  . DISCONTD: levalbuterol  0.63 mg Nebulization Q6H  . DISCONTD: vancomycin  1,000 mg Intravenous Q8H   Assessment: Okay for Protocol Trough level below goal Excellent renal fxn Excellent Vancomycin clearance  Goal of Therapy:  Vancomycin trough level 15-20 mcg/ml  Plan: Increase Vancomycin 1250 mg  IV every 8 hours. Re-Check trough Sunday 6 AM F/U cultures as available Monitor renal fxn, labs per protocol  Raquel James, Kamal Jurgens Bennett 06/06/2011,4:08 PM

## 2011-06-06 NOTE — Progress Notes (Signed)
Subjective: She says she feels better but still having symptoms. I discussed her situation with Dr. Felecia Shelling and we are going to plan to add steroids she's coughing but not coughing anything up.  Objective: Vital signs in last 24 hours: Temp:  [97.5 F (36.4 C)-98.6 F (37 C)] 98.6 F (37 C) (04/05 0529) Pulse Rate:  [76-88] 76  (04/05 0529) Resp:  [17-18] 18  (04/05 0529) BP: (110-116)/(69-75) 113/75 mmHg (04/05 0529) SpO2:  [93 %-99 %] 97 % (04/05 0744) Weight change:  Last BM Date: 06/05/11  Intake/Output from previous day: 04/04 0701 - 04/05 0700 In: 5066.8 [P.O.:600; I.V.:4166.8; IV Piggyback:300] Out: 2100 [Urine:2100]  PHYSICAL EXAM General appearance: alert, cooperative and mild distress Resp: rhonchi bilaterally Cardio: regular rate and rhythm, S1, S2 normal, no murmur, click, rub or gallop GI: soft, non-tender; bowel sounds normal; no masses,  no organomegaly Extremities: extremities normal, atraumatic, no cyanosis or edema  Lab Results:    Basic Metabolic Panel:  Basename 06/06/11 0501 06/05/11 1017  NA 140 138  K 4.0 3.7  CL 107 107  CO2 25 25  GLUCOSE 90 130*  BUN 4* 4*  CREATININE 0.55 0.55  CALCIUM 8.7 8.7  MG -- --  PHOS -- --   Liver Function Tests: No results found for this basename: AST:2,ALT:2,ALKPHOS:2,BILITOT:2,PROT:2,ALBUMIN:2 in the last 72 hours No results found for this basename: LIPASE:2,AMYLASE:2 in the last 72 hours No results found for this basename: AMMONIA:2 in the last 72 hours CBC:  Basename 06/05/11 1017 06/04/11 0523  WBC 10.3 10.6*  NEUTROABS -- 7.7  HGB 11.0* 11.4*  HCT 31.5* 32.6*  MCV 87.7 86.7  PLT 182 153   Cardiac Enzymes: No results found for this basename: CKTOTAL:3,CKMB:3,CKMBINDEX:3,TROPONINI:3 in the last 72 hours BNP: No results found for this basename: PROBNP:3 in the last 72 hours D-Dimer: No results found for this basename: DDIMER:2 in the last 72 hours CBG: No results found for this basename: GLUCAP:6  in the last 72 hours Hemoglobin A1C: No results found for this basename: HGBA1C in the last 72 hours Fasting Lipid Panel: No results found for this basename: CHOL,HDL,LDLCALC,TRIG,CHOLHDL,LDLDIRECT in the last 72 hours Thyroid Function Tests: No results found for this basename: TSH,T4TOTAL,FREET4,T3FREE,THYROIDAB in the last 72 hours Anemia Panel: No results found for this basename: VITAMINB12,FOLATE,FERRITIN,TIBC,IRON,RETICCTPCT in the last 72 hours Coagulation: No results found for this basename: LABPROT:2,INR:2 in the last 72 hours Urine Drug Screen: Drugs of Abuse     Component Value Date/Time   LABOPIA POSITIVE* 06/03/2011 0500   LABOPIA NEGATIVE 10/28/2008 0957   COCAINSCRNUR NONE DETECTED 06/03/2011 0500   COCAINSCRNUR NEGATIVE 10/28/2008 0957   LABBENZ NONE DETECTED 06/03/2011 0500   LABBENZ  Value: POSITIVE (NOTE) Result repeated and verified. Sent for confirmatory testing* 10/28/2008 0957   AMPHETMU NONE DETECTED 06/03/2011 0500   AMPHETMU NEGATIVE 10/28/2008 0957   THCU POSITIVE* 06/03/2011 0500   LABBARB NONE DETECTED 06/03/2011 0500    Alcohol Level: No results found for this basename: ETH:2 in the last 72 hours Urinalysis: No results found for this basename: COLORURINE:2,APPERANCEUR:2,LABSPEC:2,PHURINE:2,GLUCOSEU:2,HGBUR:2,BILIRUBINUR:2,KETONESUR:2,PROTEINUR:2,UROBILINOGEN:2,NITRITE:2,LEUKOCYTESUR:2 in the last 72 hours Misc. Labs:  ABGS No results found for this basename: PHART,PCO2,PO2ART,TCO2,HCO3 in the last 72 hours CULTURES Recent Results (from the past 240 hour(s))  CULTURE, SPUTUM-ASSESSMENT     Status: Normal   Collection Time   06/03/11  5:05 AM      Component Value Range Status Comment   Specimen Description SPU   Final    Special Requests NONE   Final  Sputum evaluation     Final    Value: THIS SPECIMEN IS ACCEPTABLE. RESPIRATORY CULTURE REPORT TO FOLLOW.   Report Status 06/03/2011 FINAL   Final   CULTURE, RESPIRATORY     Status: Normal   Collection Time    06/03/11  5:05 AM      Component Value Range Status Comment   Specimen Description SPUTUM   Final    Special Requests NONE   Final    Gram Stain     Final    Value: FEW WBC PRESENT, PREDOMINANTLY PMN     NO SQUAMOUS EPITHELIAL CELLS SEEN     NO ORGANISMS SEEN   Culture NORMAL OROPHARYNGEAL FLORA   Final    Report Status 06/05/2011 FINAL   Final   CULTURE, BLOOD (ROUTINE X 2)     Status: Normal (Preliminary result)   Collection Time   06/03/11  6:32 AM      Component Value Range Status Comment   Specimen Description Blood LEFT HAND   Final    Special Requests NONE 6CC   Final    Culture NO GROWTH 3 DAYS   Final    Report Status PENDING   Incomplete   CULTURE, BLOOD (ROUTINE X 2)     Status: Normal (Preliminary result)   Collection Time   06/03/11  7:09 AM      Component Value Range Status Comment   Specimen Description Blood RIGHT HAND   Final    Special Requests NONE 6CC   Final    Culture NO GROWTH 3 DAYS   Final    Report Status PENDING   Incomplete    Studies/Results: No results found.  Medications:  Scheduled:   . azithromycin  500 mg Intravenous Q24H  . cefTRIAXone (ROCEPHIN)  IV  1 g Intravenous Q24H  . gabapentin  800 mg Oral QID  . levalbuterol  0.63 mg Nebulization TID  . methylPREDNISolone (SOLU-MEDROL) injection  40 mg Intravenous Q6H  . nicotine  21 mg Transdermal Daily  . pregabalin  100 mg Oral BID  . risperiDONE  3 mg Oral BID  . sertraline  200 mg Oral Daily  . vancomycin  1,000 mg Intravenous Q8H  . DISCONTD: levalbuterol  0.63 mg Nebulization Q6H  . DISCONTD: vancomycin  1,000 mg Intravenous Q12H   Continuous:   . 0.9 % NaCl with KCl 20 mEq / L 75 mL/hr at 06/05/11 2035   WUJ:WJXB & mag hydroxide-simeth, chlorpheniramine-HYDROcodone, ibuprofen, ipratropium, promethazine  Assesment: She has multifocal pneumonia and may have a parenchymal lung disease as well such as sarcoidosis she is improving but slowly Principal Problem:  *PNA (pneumonia) Active  Problems:  Hypoxia  H/O drug abuse  Dehydration  Hypokalemia  Renal insufficiency    Plan: I will have her start on steroids continue with her other treatments. I don't plan to workup the potential sarcoidosis until she's over this acute illness    LOS: 4 days   Charice Zuno L 06/06/2011, 8:51 AM

## 2011-06-06 NOTE — Progress Notes (Signed)
NAMEZAYRA, DEVITO NO.:  [id number]  MEDICAL RECORD NO.:  [id number]  LOCATION:  A306                          FACILITY:  [facility]  PHYSICIAN:  Aveya Beal L. Juanetta Gosling, M.D.DATE OF BIRTH:  1975-10-28  DATE OF PROCEDURE: DATE OF DISCHARGE:                                PROGRESS NOTE   Ms. Kilian says that she does not feel as well as she did yesterday. She is still having some cough and congestion.  She has no other new complaints.  She is coughing up some sputum.  I got her some information about sarcoidosis and she had some questions.  PHYSICAL EXAMINATION:  GENERAL:  She is awake and alert. CHEST:  Clear. HEART:  Regular. ABDOMEN:  Soft. EXTREMITIES:  No edema. CENTRAL NERVOUS SYSTEM:  Grossly intact.  ASSESSMENT:  She has pneumonia.  She may have something like sarcoidosis.  PLAN:  To continue with current treatments and with her antibiotics, etc.     Korion Cuevas L. Juanetta Gosling, M.D.     ELH/MEDQ  D:  06/05/2011  T:  06/06/2011  Job:  [id number]

## 2011-06-07 NOTE — Progress Notes (Signed)
Called into room patient stated she thought she was "running a fever". Took temp 98.

## 2011-06-07 NOTE — Progress Notes (Signed)
NAMELENNIS, RADER                ACCOUNT NO.:  [id number]  MEDICAL RECORD NO.:  [id number]  LOCATION:  A306                          FACILITY:  [facility]  PHYSICIAN:  Lameka Disla D. Felecia Shelling, MD   DATE OF BIRTH:  1975-09-14  DATE OF PROCEDURE:  06/07/2011 DATE OF DISCHARGE:                                PROGRESS NOTE   SUBJECTIVE:  The patient feels better.  Her breathing is improving.  No cough or chills.  OBJECTIVE:  GENERAL:  The patient is more alert, awake, resting. VITAL SIGNS:  Blood pressure 150/57, pulse 57, respiratory rate 18, temperature 97 degrees Fahrenheit. CHEST:  Decreased air entry, bilateral rhonchi. CARDIOVASCULAR SYSTEM:  First and second heart sounds heard.  No murmur. No gallop. ABDOMEN:  Soft and lax.  Bowel sound is positive.  No mass or organomegaly. EXTREMITIES:  No leg edema.  ASSESSMENT: 1. Bilateral pneumonia. 2. History of bronchial asthma. 3. History of seizure disorder.  PLAN:  We will continue the patient on IV antibiotics.  Continue nebulizer treatment and current medications.     Croix Presley D. Felecia Shelling, MD     TDF/MEDQ  D:  06/07/2011  T:  06/07/2011  Job:  [id number]

## 2011-06-07 NOTE — Progress Notes (Signed)
Subjective: I think she's overall about the same. She seems to be improving as far as her breathing is concerned. She has no new complaints  Objective: Vital signs in last 24 hours: Temp:  [97.6 F (36.4 C)-98.2 F (36.8 C)] 97.9 F (36.6 C) (04/06 0520) Pulse Rate:  [57-77] 57  (04/06 0520) Resp:  [18-20] 18  (04/06 0520) BP: (115-130)/(57-83) 116/66 mmHg (04/06 0520) SpO2:  [93 %-98 %] 95 % (04/06 0717) Weight change:  Last BM Date: 06/06/11  Intake/Output from previous day: 04/05 0701 - 04/06 0700 In: 3775 [P.O.:1140; I.V.:1835; IV Piggyback:800] Out: 4300 [Urine:4300]  PHYSICAL EXAM General appearance: alert Resp: rhonchi bilaterally Cardio: regular rate and rhythm, S1, S2 normal, no murmur, click, rub or gallop GI: soft, non-tender; bowel sounds normal; no masses,  no organomegaly Extremities: extremities normal, atraumatic, no cyanosis or edema  Lab Results:    Basic Metabolic Panel:  Basename 06/06/11 0501 06/05/11 1017  NA 140 138  K 4.0 3.7  CL 107 107  CO2 25 25  GLUCOSE 90 130*  BUN 4* 4*  CREATININE 0.55 0.55  CALCIUM 8.7 8.7  MG -- --  PHOS -- --   Liver Function Tests: No results found for this basename: AST:2,ALT:2,ALKPHOS:2,BILITOT:2,PROT:2,ALBUMIN:2 in the last 72 hours No results found for this basename: LIPASE:2,AMYLASE:2 in the last 72 hours No results found for this basename: AMMONIA:2 in the last 72 hours CBC:  Basename 06/05/11 1017  WBC 10.3  NEUTROABS --  HGB 11.0*  HCT 31.5*  MCV 87.7  PLT 182   Cardiac Enzymes: No results found for this basename: CKTOTAL:3,CKMB:3,CKMBINDEX:3,TROPONINI:3 in the last 72 hours BNP: No results found for this basename: PROBNP:3 in the last 72 hours D-Dimer: No results found for this basename: DDIMER:2 in the last 72 hours CBG: No results found for this basename: GLUCAP:6 in the last 72 hours Hemoglobin A1C: No results found for this basename: HGBA1C in the last 72 hours Fasting Lipid  Panel: No results found for this basename: CHOL,HDL,LDLCALC,TRIG,CHOLHDL,LDLDIRECT in the last 72 hours Thyroid Function Tests: No results found for this basename: TSH,T4TOTAL,FREET4,T3FREE,THYROIDAB in the last 72 hours Anemia Panel: No results found for this basename: VITAMINB12,FOLATE,FERRITIN,TIBC,IRON,RETICCTPCT in the last 72 hours Coagulation: No results found for this basename: LABPROT:2,INR:2 in the last 72 hours Urine Drug Screen: Drugs of Abuse     Component Value Date/Time   LABOPIA POSITIVE* 06/03/2011 0500   LABOPIA NEGATIVE 10/28/2008 0957   COCAINSCRNUR NONE DETECTED 06/03/2011 0500   COCAINSCRNUR NEGATIVE 10/28/2008 0957   LABBENZ NONE DETECTED 06/03/2011 0500   LABBENZ  Value: POSITIVE (NOTE) Result repeated and verified. Sent for confirmatory testing* 10/28/2008 0957   AMPHETMU NONE DETECTED 06/03/2011 0500   AMPHETMU NEGATIVE 10/28/2008 0957   THCU POSITIVE* 06/03/2011 0500   LABBARB NONE DETECTED 06/03/2011 0500    Alcohol Level: No results found for this basename: ETH:2 in the last 72 hours Urinalysis: No results found for this basename: COLORURINE:2,APPERANCEUR:2,LABSPEC:2,PHURINE:2,GLUCOSEU:2,HGBUR:2,BILIRUBINUR:2,KETONESUR:2,PROTEINUR:2,UROBILINOGEN:2,NITRITE:2,LEUKOCYTESUR:2 in the last 72 hours Misc. Labs:  ABGS No results found for this basename: PHART,PCO2,PO2ART,TCO2,HCO3 in the last 72 hours CULTURES Recent Results (from the past 240 hour(s))  CULTURE, SPUTUM-ASSESSMENT     Status: Normal   Collection Time   06/03/11  5:05 AM      Component Value Range Status Comment   Specimen Description SPU   Final    Special Requests NONE   Final    Sputum evaluation     Final    Value: THIS SPECIMEN IS ACCEPTABLE. RESPIRATORY CULTURE  REPORT TO FOLLOW.   Report Status 06/03/2011 FINAL   Final   CULTURE, RESPIRATORY     Status: Normal   Collection Time   06/03/11  5:05 AM      Component Value Range Status Comment   Specimen Description SPUTUM   Final    Special Requests  NONE   Final    Gram Stain     Final    Value: FEW WBC PRESENT, PREDOMINANTLY PMN     NO SQUAMOUS EPITHELIAL CELLS SEEN     NO ORGANISMS SEEN   Culture NORMAL OROPHARYNGEAL FLORA   Final    Report Status 06/05/2011 FINAL   Final   CULTURE, BLOOD (ROUTINE X 2)     Status: Normal (Preliminary result)   Collection Time   06/03/11  6:32 AM      Component Value Range Status Comment   Specimen Description Blood LEFT HAND   Final    Special Requests NONE 6CC   Final    Culture NO GROWTH 3 DAYS   Final    Report Status PENDING   Incomplete   CULTURE, BLOOD (ROUTINE X 2)     Status: Normal (Preliminary result)   Collection Time   06/03/11  7:09 AM      Component Value Range Status Comment   Specimen Description Blood RIGHT HAND   Final    Special Requests NONE [facility]   Final    Culture NO GROWTH 3 DAYS   Final    Report Status PENDING   Incomplete    Studies/Results: Dg Chest 2 View  06/06/2011  *RADIOLOGY REPORT*  Clinical Data: Pneumonia  CHEST - 2 VIEW  Comparison: CT and chest x-ray 06/03/2011  Findings: The heart and mediastinal contours are stable.  There are unchanged bilateral alveolar infiltrates identified in both perihilar zones, in the right upper lobe and at the left lung base. No significant change is noted in comparison with the prior exam. No pleural fluid is seen and no new focus of infiltrate is noted.  Bony structures are intact.  IMPRESSION: Stable bilateral pulmonary infiltrates.  Differential considerations remain as outlined on the prior recent CT.  No new focal abnormalities identified  Original Report Authenticated By: Bertha Stakes, M.D.    Medications:  Scheduled:   . azithromycin  500 mg Intravenous Q24H  . cefTRIAXone (ROCEPHIN)  IV  1 g Intravenous Q24H  . gabapentin  800 mg Oral QID  . levalbuterol  0.63 mg Nebulization TID  . methylPREDNISolone (SOLU-MEDROL) injection  40 mg Intravenous Q6H  . nicotine  21 mg Transdermal Daily  . pregabalin  100 mg Oral BID   . risperiDONE  3 mg Oral BID  . sertraline  200 mg Oral Daily  . vancomycin  1,250 mg Intravenous Q8H  . DISCONTD: vancomycin  1,000 mg Intravenous Q8H   Continuous:   . 0.9 % NaCl with KCl 20 mEq / L 75 mL/hr at 06/06/11 1809   WUJ:WJXB & mag hydroxide-simeth, chlorpheniramine-HYDROcodone, ibuprofen, ipratropium, promethazine  Assesment: She is admitted with pneumonia. She may have an interstitial lung disease. They'll need to be worked up as an outpatient. She seems was improved with the addition of steroids. I think she also has COPD Principal Problem:  *PNA (pneumonia) Active Problems:  Hypoxia  H/O drug abuse  Dehydration  Hypokalemia  Renal insufficiency    Plan: Continue treatments. As an outpatient she'll need pulmonary function testing possibly bronchoscopy and alpha 1 anti-trypsin level  LOS: 5 days   Celicia Minahan L 06/07/2011, 10:10 AM

## 2011-06-07 NOTE — Progress Notes (Signed)
Pts hr runs , 58 normal but is running 44- 48 without o2 ? sats 97, pt just came back from walk.

## 2011-06-08 MED ORDER — SODIUM CHLORIDE 0.9 % IJ SOLN
INTRAMUSCULAR | Status: AC
  Administered 2011-06-08: 5 mL
  Filled 2011-06-08: qty 3

## 2011-06-08 MED ORDER — FLUCONAZOLE 100 MG PO TABS
100.0000 mg | ORAL_TABLET | Freq: Every day | ORAL | Status: DC
  Administered 2011-06-08: 100 mg via ORAL
  Filled 2011-06-08: qty 1

## 2011-06-08 MED ORDER — PREDNISONE 20 MG PO TABS
50.0000 mg | ORAL_TABLET | Freq: Every day | ORAL | Status: DC
  Administered 2011-06-08: 50 mg via ORAL
  Filled 2011-06-08: qty 5
  Filled 2011-06-08: qty 1

## 2011-06-08 NOTE — Progress Notes (Signed)
Latasha [name]                ACCOUNT NO.:  [id number]  MEDICAL RECORD NO.:  [id number]  LOCATION:  A306                          FACILITY:  [facility]  PHYSICIAN:  Mackenzey Crownover D. Felecia Shelling, MD   DATE OF BIRTH:  06-Mar-1975  DATE OF PROCEDURE: DATE OF DISCHARGE:                                PROGRESS NOTE   The patient feels better.  Her breathing is improving.  No fever or chills.  OBJECTIVE:  GENERAL/VITAL SIGNS:  Patient is alert, awake, and sick- looking with vitals blood pressure 117/67, pulse 51, respiratory rate 18, temperature 97.2 degrees Fahrenheit. CHEST:  Decreased air entry, few rhonchi. CARDIOVASCULAR SYSTEM:  First and second heart sounds heard.  No murmur, no gallop. ABDOMEN:  Soft and lax.  Bowel sound is positive.  No mass or organomegaly. EXTREMITIES:  No leg edema.  ASSESSMENT: 1. Bilateral pneumonia, clinically improving. 2. History of bronchial asthma. 3. Bipolar disease. 4. Rule out sarcoidosis.  PLAN:  We will continue the patient on IV antibiotics.  Continue nebulizer treatment.  Continue current treatment and supportive care.     Latasha [name] D. Felecia Shelling, MD     TDF/MEDQ  D:  06/08/2011  T:  06/08/2011  Job:  [id number]

## 2011-06-08 NOTE — Progress Notes (Signed)
ANTIBIOTIC CONSULT NOTE  Pharmacy Consult for Vancomycin Indication: rule out pneumonia  Allergies  Allergen Reactions  . Abilify   . Haldol (Haloperidol Decanoate)   . Keflex   . Tegretol (Carbamazepine)   . Zofran    Patient Measurements: Height: 5\' 8"  (172.7 cm) Weight: 179 lb 7.3 oz (81.4 kg) IBW/kg (Calculated) : 63.9   Vital Signs: Temp: 98.3 F (36.8 C) (04/07 0519) Temp src: Oral (04/07 0519) BP: 185/86 mmHg (04/07 0519) Pulse Rate: 70  (04/07 0519) Intake/Output from previous day: 04/06 0701 - 04/07 0700 In: 4463.8 [P.O.:1590; I.V.:1823.8; IV Piggyback:1050] Out: 1702 [Urine:1700; Stool:2] Intake/Output from this shift:    Labs:  Basename 06/06/11 0501 06/05/11 1017  WBC -- 10.3  HGB -- 11.0*  PLT -- 182  LABCREA -- --  CREATININE 0.55 0.55   Estimated Creatinine Clearance: 110.9 ml/min (by C-G formula based on Cr of 0.55).  Basename 06/08/11 0540 06/06/11 1338  VANCOTROUGH 13.2 9.7*  VANCOPEAK -- --  Drue Dun -- --  GENTTROUGH -- --  GENTPEAK -- --  GENTRANDOM -- --  TOBRATROUGH -- --  TOBRAPEAK -- --  TOBRARND -- --  AMIKACINPEAK -- --  AMIKACINTROU -- --  AMIKACIN -- --    Microbiology: Recent Results (from the past 720 hour(s))  CULTURE, SPUTUM-ASSESSMENT     Status: Normal   Collection Time   06/03/11  5:05 AM      Component Value Range Status Comment   Specimen Description SPU   Final    Special Requests NONE   Final    Sputum evaluation     Final    Value: THIS SPECIMEN IS ACCEPTABLE. RESPIRATORY CULTURE REPORT TO FOLLOW.   Report Status 06/03/2011 FINAL   Final   CULTURE, RESPIRATORY     Status: Normal   Collection Time   06/03/11  5:05 AM      Component Value Range Status Comment   Specimen Description SPUTUM   Final    Special Requests NONE   Final    Gram Stain     Final    Value: FEW WBC PRESENT, PREDOMINANTLY PMN     NO SQUAMOUS EPITHELIAL CELLS SEEN     NO ORGANISMS SEEN   Culture NORMAL OROPHARYNGEAL FLORA   Final    Report Status 06/05/2011 FINAL   Final   CULTURE, BLOOD (ROUTINE X 2)     Status: Normal (Preliminary result)   Collection Time   06/03/11  6:32 AM      Component Value Range Status Comment   Specimen Description Blood LEFT HAND   Final    Special Requests NONE 6CC   Final    Culture NO GROWTH 3 DAYS   Final    Report Status PENDING   Incomplete   CULTURE, BLOOD (ROUTINE X 2)     Status: Normal (Preliminary result)   Collection Time   06/03/11  7:09 AM      Component Value Range Status Comment   Specimen Description Blood RIGHT HAND   Final    Special Requests NONE 6CC   Final    Culture NO GROWTH 3 DAYS   Final    Report Status PENDING   Incomplete    Medical History: Past Medical History  Diagnosis Date  . Seizure   . High cholesterol   . ADHD (attention deficit hyperactivity disorder)   . Bipolar disorder   . Asthma    Medications:  Scheduled:     . azithromycin  500 mg  Intravenous Q24H  . cefTRIAXone (ROCEPHIN)  IV  1 g Intravenous Q24H  . gabapentin  800 mg Oral QID  . levalbuterol  0.63 mg Nebulization TID  . nicotine  21 mg Transdermal Daily  . predniSONE  50 mg Oral Q breakfast  . pregabalin  100 mg Oral BID  . risperiDONE  3 mg Oral BID  . sertraline  200 mg Oral Daily  . vancomycin  1,250 mg Intravenous Q8H  . DISCONTD: methylPREDNISolone (SOLU-MEDROL) injection  40 mg Intravenous Q6H   Assessment: Okay for Protocol Trough level slightly below goal but acceptable Pt on large dose of Vanco May go home 4/8 per MD note Excellent renal fxn Excellent Vancomycin clearance  Goal of Therapy:  Vancomycin trough level 15-20 mcg/ml  Plan: Continue Vancomycin 1250 mg  IV every 8 hours. De-escalate ABX when appropriate F/U cultures as available Monitor renal fxn, labs per protocol  Valrie Hart A 06/08/2011,10:08 AM

## 2011-06-08 NOTE — Progress Notes (Signed)
Subjective: She says she feels better. She is hopeful of going home tomorrow. She has no new complaints. She is still short of breath and coughing but not coughing much up  Objective: Vital signs in last 24 hours: Temp:  [97.2 F (36.2 C)-98.3 F (36.8 C)] 98.3 F (36.8 C) (04/07 0519) Pulse Rate:  [51-76] 70  (04/07 0519) Resp:  [18] 18  (04/07 0519) BP: (117-185)/(67-86) 185/86 mmHg (04/07 0519) SpO2:  [56 %-96 %] 90 % (04/07 0726) Weight change:  Last BM Date: 06/07/11  Intake/Output from previous day: 04/06 0701 - 04/07 0700 In: 4463.8 [P.O.:1590; I.V.:1823.8; IV Piggyback:1050] Out: 1702 [Urine:1700; Stool:2]  PHYSICAL EXAM General appearance: alert, cooperative and mild distress Resp: rhonchi bilaterally Cardio: regular rate and rhythm, S1, S2 normal, no murmur, click, rub or gallop GI: soft, non-tender; bowel sounds normal; no masses,  no organomegaly Extremities: extremities normal, atraumatic, no cyanosis or edema  Lab Results:    Basic Metabolic Panel:  Basename 06/06/11 0501 06/05/11 1017  NA 140 138  K 4.0 3.7  CL 107 107  CO2 25 25  GLUCOSE 90 130*  BUN 4* 4*  CREATININE 0.55 0.55  CALCIUM 8.7 8.7  MG -- --  PHOS -- --   Liver Function Tests: No results found for this basename: AST:2,ALT:2,ALKPHOS:2,BILITOT:2,PROT:2,ALBUMIN:2 in the last 72 hours No results found for this basename: LIPASE:2,AMYLASE:2 in the last 72 hours No results found for this basename: AMMONIA:2 in the last 72 hours CBC:  Basename 06/05/11 1017  WBC 10.3  NEUTROABS --  HGB 11.0*  HCT 31.5*  MCV 87.7  PLT 182   Cardiac Enzymes: No results found for this basename: CKTOTAL:3,CKMB:3,CKMBINDEX:3,TROPONINI:3 in the last 72 hours BNP: No results found for this basename: PROBNP:3 in the last 72 hours D-Dimer: No results found for this basename: DDIMER:2 in the last 72 hours CBG: No results found for this basename: GLUCAP:6 in the last 72 hours Hemoglobin A1C: No results  found for this basename: HGBA1C in the last 72 hours Fasting Lipid Panel: No results found for this basename: CHOL,HDL,LDLCALC,TRIG,CHOLHDL,LDLDIRECT in the last 72 hours Thyroid Function Tests: No results found for this basename: TSH,T4TOTAL,FREET4,T3FREE,THYROIDAB in the last 72 hours Anemia Panel: No results found for this basename: VITAMINB12,FOLATE,FERRITIN,TIBC,IRON,RETICCTPCT in the last 72 hours Coagulation: No results found for this basename: LABPROT:2,INR:2 in the last 72 hours Urine Drug Screen: Drugs of Abuse     Component Value Date/Time   LABOPIA POSITIVE* 06/03/2011 0500   LABOPIA NEGATIVE 10/28/2008 0957   COCAINSCRNUR NONE DETECTED 06/03/2011 0500   COCAINSCRNUR NEGATIVE 10/28/2008 0957   LABBENZ NONE DETECTED 06/03/2011 0500   LABBENZ  Value: POSITIVE (NOTE) Result repeated and verified. Sent for confirmatory testing* 10/28/2008 0957   AMPHETMU NONE DETECTED 06/03/2011 0500   AMPHETMU NEGATIVE 10/28/2008 0957   THCU POSITIVE* 06/03/2011 0500   LABBARB NONE DETECTED 06/03/2011 0500    Alcohol Level: No results found for this basename: ETH:2 in the last 72 hours Urinalysis: No results found for this basename: COLORURINE:2,APPERANCEUR:2,LABSPEC:2,PHURINE:2,GLUCOSEU:2,HGBUR:2,BILIRUBINUR:2,KETONESUR:2,PROTEINUR:2,UROBILINOGEN:2,NITRITE:2,LEUKOCYTESUR:2 in the last 72 hours Misc. Labs:  ABGS No results found for this basename: PHART,PCO2,PO2ART,TCO2,HCO3 in the last 72 hours CULTURES Recent Results (from the past 240 hour(s))  CULTURE, SPUTUM-ASSESSMENT     Status: Normal   Collection Time   06/03/11  5:05 AM      Component Value Range Status Comment   Specimen Description SPU   Final    Special Requests NONE   Final    Sputum evaluation  Final    Value: THIS SPECIMEN IS ACCEPTABLE. RESPIRATORY CULTURE REPORT TO FOLLOW.   Report Status 06/03/2011 FINAL   Final   CULTURE, RESPIRATORY     Status: Normal   Collection Time   06/03/11  5:05 AM      Component Value Range Status  Comment   Specimen Description SPUTUM   Final    Special Requests NONE   Final    Gram Stain     Final    Value: FEW WBC PRESENT, PREDOMINANTLY PMN     NO SQUAMOUS EPITHELIAL CELLS SEEN     NO ORGANISMS SEEN   Culture NORMAL OROPHARYNGEAL FLORA   Final    Report Status 06/05/2011 FINAL   Final   CULTURE, BLOOD (ROUTINE X 2)     Status: Normal (Preliminary result)   Collection Time   06/03/11  6:32 AM      Component Value Range Status Comment   Specimen Description Blood LEFT HAND   Final    Special Requests NONE 6CC   Final    Culture NO GROWTH 3 DAYS   Final    Report Status PENDING   Incomplete   CULTURE, BLOOD (ROUTINE X 2)     Status: Normal (Preliminary result)   Collection Time   06/03/11  7:09 AM      Component Value Range Status Comment   Specimen Description Blood RIGHT HAND   Final    Special Requests NONE 6CC   Final    Culture NO GROWTH 3 DAYS   Final    Report Status PENDING   Incomplete    Studies/Results: No results found.  Medications:  Scheduled:   . azithromycin  500 mg Intravenous Q24H  . cefTRIAXone (ROCEPHIN)  IV  1 g Intravenous Q24H  . gabapentin  800 mg Oral QID  . levalbuterol  0.63 mg Nebulization TID  . methylPREDNISolone (SOLU-MEDROL) injection  40 mg Intravenous Q6H  . nicotine  21 mg Transdermal Daily  . pregabalin  100 mg Oral BID  . risperiDONE  3 mg Oral BID  . sertraline  200 mg Oral Daily  . vancomycin  1,250 mg Intravenous Q8H   Continuous:   . DISCONTD: 0.9 % NaCl with KCl 20 mEq / L 75 mL/hr at 06/07/11 1202   ZOX:WRUE & mag hydroxide-simeth, chlorpheniramine-HYDROcodone, ibuprofen, ipratropium, promethazine  Assesment: She is admitted with pneumonia. She is improving. She has other abnormalities on chest x-ray that may be something like sarcoidosis. Principal Problem:  *PNA (pneumonia) Active Problems:  Hypoxia  H/O drug abuse  Dehydration  Hypokalemia  Renal insufficiency    Plan: Okay to switch to by mouth  steroids    LOS: 6 days   Brave Dack L 06/08/2011, 9:44 AM

## 2011-06-08 NOTE — Progress Notes (Signed)
Patient stated she thinks she's getting a yeast infection. Called Dr. Juanetta Gosling who ordered Diflucan 100mg  PO Daily. Order entered.

## 2011-06-09 LAB — BASIC METABOLIC PANEL
BUN: 10 mg/dL (ref 6–23)
Calcium: 8.8 mg/dL (ref 8.4–10.5)
GFR calc Af Amer: >90 mL/min (ref >90)
GFR calc non Af Amer: >90 mL/min (ref >90)
Glucose, Bld: 153 mg/dL — ABNORMAL HIGH (ref 70–99)
Potassium: 3.5 mEq/L (ref 3.5–5.1)
Sodium: 140 mEq/L (ref 135–145)

## 2011-06-09 LAB — CULTURE, BLOOD (ROUTINE X 2): Culture: NO GROWTH

## 2011-06-09 MED ORDER — AMOXICILLIN-POT CLAVULANATE 500-125 MG PO TABS: 1.0000 | ORAL_TABLET | Freq: Three times a day (TID) | ORAL | Status: AC

## 2011-06-09 NOTE — Progress Notes (Signed)
Pt d/c instructions, medications and follow up appts discussed with pt. No issues or concerns addressed. Pt d/ced home at this time. Ambulated out of facility by staff.

## 2011-06-09 NOTE — Progress Notes (Signed)
She is being discharged home today and I agree with that decision. She looks much better. Her chest is much clearer. I will plan to see her in my office and she will need followup for the abnormalities on CT, she will need pulmonary function testing, alpha-1 anti-trypsin level and potentially bronchoscopy

## 2011-06-09 NOTE — Discharge Summary (Signed)
[name], [name]                ACCOUNT NO.:  [id number]  MEDICAL RECORD NO.:  [id number]  LOCATION:  A306                          FACILITY:  [facility]  PHYSICIAN:  Latasha Dunnavant D. Felecia Shelling, MD   DATE OF BIRTH:  [birth year]  DATE OF ADMISSION:  06/03/2011 DATE OF DISCHARGE:  04/08/2013LH                              DISCHARGE SUMMARY   DISCHARGE DIAGNOSES: 1. Bilateral pneumonia. 2. History of bronchial asthma. 3. Bipolar disease. 4. Seizure disorder. 5. History of attention deficit hyperactivity disorder. 6. History of hyperlipidemia.  DISCHARGE MEDICATIONS: 1. Augmentin 500 mg p.o. t.i.d. for 5 days. 2. Valium 10 mg q.12 hours. 3. Neurontin 800 mg 4 times a day. 4. Ibuprofen 800 mg 4 times a day p.r.n. 5. Lyrica 100 mg b.i.d. 6. ProAir 2 puffs q.6 p.r.n. 7. Risperdal 3 mg b.i.d. 8. Zoloft 200 mg daily.  DISPOSITION:  The patient will be discharged to home in a stable condition.  DISCHARGE INSTRUCTIONS:  The patient will be followed in my office in 1 week duration and she will be also followed by Dr. Kari Baars in 2 weeks' duration.  The patient is advised to continue oral antibiotics.  LABORATORY DATA ON DISCHARGE:  Sodium 140, potassium 3.5, chloride 106, carbon dioxide 23, glucose 153, BUN 10, creatinine 0.55, calcium 8.8.  HOSPITAL COURSE:  This is a 36 year old female patient with history of multiple medical illnesses who was admitted through the emergency room due to cough, shortness of breath, and fever.  She was recently treated for otitis media.  The patient had worsening of her symptoms.  During admission, the patient's chest x-ray showed infiltrates bilaterally.  A CT scan of the chest was later done which showed extensive bilateral primary ground-glass pulmonary infiltrate, possibility of sarcoidosis was considered.  The patient was continued on combination of IV antibiotics and she received also nebulizer treatment.  The patient was evaluated by Dr. Juanetta Gosling who  is a pulmonologist.  Over the hospital stay, the patient has improved and her symptoms are subsided.  She will be discharged on oral antibiotics and the patient will be followed with Dr. Juanetta Gosling in 2 weeks' time for possible bronchoscopy and further evaluation.     Latasha Rincon D. Felecia Shelling, MD     TDF/MEDQ  D:  06/09/2011  T:  06/09/2011  Job:  [id number]

## 2011-06-09 NOTE — Progress Notes (Signed)
Discharge summary sent to payer through [facility]  

## 2011-06-09 NOTE — Progress Notes (Signed)
   CARE MANAGEMENT NOTE 06/09/2011  Patient:  Latasha [name], Latasha [name]   Account Number:  [id number]  Date Initiated:  06/03/2011  Documentation initiated by:  Sharrie Rothman  Subjective/Objective Assessment:   Pt admitted with pneumonia. Pt states that she stays with friends. Pt is diabled.No history of home health and no equipment at home.     Action/Plan:   CM spoke with pt about discharge needs. Pt states she will need a neb machine at discharge. No medication assistance needed.   Anticipated DC Date:  06/10/2011   Anticipated DC Plan:  HOME/SELF CARE      DC Planning Services  CM consult      Choice offered to / List presented to:             Status of service:  Completed, signed off Medicare Important Message given?   (If response is "NO", the following Medicare IM given date fields will be blank) Date Medicare IM given:   Date Additional Medicare IM given:    Discharge Disposition:  HOME/SELF CARE  Per UR Regulation:    If discussed at Long Length of Stay Meetings, dates discussed:    Comments:  06/09/11 1125 Arlyss Queen, RN BSN CM No CM needs noted at discharge.  06/03/11 0844 Arlyss Queen, RN BSN CM Pt admitted with pneumonia. Pt disabled at this time. Will follow for any discharge needs.

## 2011-06-29 ENCOUNTER — Encounter (HOSPITAL_COMMUNITY): Payer: Self-pay | Admitting: *Deleted

## 2011-06-29 ENCOUNTER — Emergency Department (HOSPITAL_COMMUNITY): Payer: PRIVATE HEALTH INSURANCE

## 2011-06-29 ENCOUNTER — Emergency Department (HOSPITAL_COMMUNITY)
Admission: EM | Admit: 2011-06-29 | Discharge: 2011-07-02 | Disposition: A | Discharge status: Home / Self Care | Payer: PRIVATE HEALTH INSURANCE | Attending: Emergency Medicine | Admitting: Emergency Medicine

## 2011-06-29 DIAGNOSIS — F319 Bipolar disorder, unspecified: Secondary | ICD-10-CM | POA: Insufficient documentation

## 2011-06-29 DIAGNOSIS — G40909 Epilepsy, unspecified, not intractable, without status epilepticus: Secondary | ICD-10-CM | POA: Insufficient documentation

## 2011-06-29 DIAGNOSIS — E7800 Pure hypercholesterolemia, unspecified: Secondary | ICD-10-CM | POA: Insufficient documentation

## 2011-06-29 DIAGNOSIS — F909 Attention-deficit hyperactivity disorder, unspecified type: Secondary | ICD-10-CM | POA: Insufficient documentation

## 2011-06-29 DIAGNOSIS — Z79899 Other long term (current) drug therapy: Secondary | ICD-10-CM | POA: Insufficient documentation

## 2011-06-29 DIAGNOSIS — J45909 Unspecified asthma, uncomplicated: Secondary | ICD-10-CM | POA: Insufficient documentation

## 2011-06-29 DIAGNOSIS — R45851 Suicidal ideations: Secondary | ICD-10-CM

## 2011-06-29 DIAGNOSIS — IMO0002 Reserved for concepts with insufficient information to code with codable children: Secondary | ICD-10-CM | POA: Insufficient documentation

## 2011-06-29 HISTORY — DX: Pneumonia, unspecified organism: J18.9

## 2011-06-29 LAB — COMPREHENSIVE METABOLIC PANEL WITH GFR
ALT: 15 U/L (ref 0–35)
AST: 15 U/L (ref 0–37)
Albumin: 4 g/dL (ref 3.5–5.2)
Alkaline Phosphatase: 73 U/L (ref 39–117)
BUN: 11 mg/dL (ref 6–23)
CO2: 26 meq/L (ref 19–32)
Calcium: 10 mg/dL (ref 8.4–10.5)
Chloride: 106 meq/L (ref 96–112)
Creatinine, Ser: 0.9 mg/dL (ref 0.50–1.10)
GFR calc Af Amer: >90 mL/min
GFR calc non Af Amer: 82 mL/min — ABNORMAL LOW
Glucose, Bld: 97 mg/dL (ref 70–99)
Potassium: 3.3 meq/L — ABNORMAL LOW (ref 3.5–5.1)
Sodium: 142 meq/L (ref 135–145)
Total Bilirubin: 0.3 mg/dL (ref 0.3–1.2)
Total Protein: 7.5 g/dL (ref 6.0–8.3)

## 2011-06-29 LAB — DIFFERENTIAL
Basophils Absolute: 0 10*3/uL (ref 0.0–0.1)
Basophils Relative: 0 % (ref 0–1)
Eosinophils Absolute: 0.3 10*3/uL (ref 0.0–0.7)
Eosinophils Relative: 2 % (ref 0–5)
Lymphocytes Relative: 28 % (ref 12–46)
Lymphs Abs: 3.1 10*3/uL (ref 0.7–4.0)
Monocytes Absolute: 0.6 10*3/uL (ref 0.1–1.0)
Monocytes Relative: 5 % (ref 3–12)
Neutro Abs: 6.9 10*3/uL (ref 1.7–7.7)
Neutrophils Relative %: 64 % (ref 43–77)

## 2011-06-29 LAB — URINALYSIS, ROUTINE W REFLEX MICROSCOPIC
Bilirubin Urine: NEGATIVE
Glucose, UA: NEGATIVE mg/dL
Hgb urine dipstick: NEGATIVE
Ketones, ur: NEGATIVE mg/dL
Leukocytes, UA: NEGATIVE
Nitrite: NEGATIVE
Protein, ur: NEGATIVE mg/dL
Specific Gravity, Urine: <1.005 — ABNORMAL LOW (ref 1.005–1.030)
Urobilinogen, UA: 0.2 mg/dL (ref 0.0–1.0)
pH: 6 (ref 5.0–8.0)

## 2011-06-29 LAB — CBC
HCT: 41 % (ref 36.0–46.0)
Hemoglobin: 14.2 g/dL (ref 12.0–15.0)
MCH: 30.7 pg (ref 26.0–34.0)
MCHC: 34.6 g/dL (ref 30.0–36.0)
MCV: 88.7 fL (ref 78.0–100.0)
Platelets: 178 10*3/uL (ref 150–400)
RBC: 4.62 MIL/uL (ref 3.87–5.11)
RDW: 14 % (ref 11.5–15.5)
WBC: 10.9 10*3/uL — ABNORMAL HIGH (ref 4.0–10.5)

## 2011-06-29 LAB — RAPID URINE DRUG SCREEN, HOSP PERFORMED
Amphetamines: NOT DETECTED
Barbiturates: NOT DETECTED
Benzodiazepines: POSITIVE — AB
Cocaine: POSITIVE — AB
Opiates: NOT DETECTED
Tetrahydrocannabinol: POSITIVE — AB

## 2011-06-29 LAB — ACETAMINOPHEN LEVEL: Acetaminophen (Tylenol), Serum: <15 ug/mL (ref 10–30)

## 2011-06-29 LAB — PREGNANCY, URINE: Preg Test, Ur: NEGATIVE

## 2011-06-29 LAB — TRICYCLICS SCREEN, URINE: TCA Scrn: NOT DETECTED

## 2011-06-29 LAB — ETHANOL: Alcohol, Ethyl (B): <11 mg/dL (ref 0–11)

## 2011-06-29 LAB — SALICYLATE LEVEL: Salicylate Lvl: <2 mg/dL — ABNORMAL LOW (ref 2.8–20.0)

## 2011-06-29 MED ORDER — ZIPRASIDONE MESYLATE 20 MG IM SOLR
20.0000 mg | Freq: Once | INTRAMUSCULAR | Status: AC
  Administered 2011-06-29: 20 mg via INTRAMUSCULAR

## 2011-06-29 MED ORDER — ZIPRASIDONE MESYLATE 20 MG IM SOLR
INTRAMUSCULAR | Status: AC
  Filled 2011-06-29: qty 20

## 2011-06-29 MED ORDER — ZIPRASIDONE MESYLATE 20 MG IM SOLR
20.0000 mg | Freq: Once | INTRAMUSCULAR | Status: AC
  Administered 2011-06-29: 20 mg via INTRAMUSCULAR
  Filled 2011-06-29: qty 20

## 2011-06-29 NOTE — ED Notes (Signed)
Patient transported to X-ray 
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
## 2011-06-29 NOTE — ED Notes (Signed)
Patient's personal belongings (4 bags) taken to safekeeping.
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
## 2011-06-30 LAB — URINE CULTURE: Colony Count: 4000

## 2011-06-30 MED ORDER — RISPERIDONE 1 MG PO TABS
3.0000 mg | ORAL_TABLET | Freq: Two times a day (BID) | ORAL | Status: DC
  Administered 2011-06-30 – 2011-07-02 (×5): 3 mg via ORAL
  Filled 2011-06-30 (×2): qty 3
  Filled 2011-06-30 (×2): qty 1
  Filled 2011-06-30: qty 3
  Filled 2011-06-30: qty 1

## 2011-06-30 MED ORDER — OXCARBAZEPINE 300 MG PO TABS
300.0000 mg | ORAL_TABLET | Freq: Every evening | ORAL | Status: DC
  Administered 2011-06-30 – 2011-07-01 (×2): 300 mg via ORAL
  Filled 2011-06-30 (×3): qty 1

## 2011-06-30 MED ORDER — NICOTINE 21 MG/24HR TD PT24
MEDICATED_PATCH | TRANSDERMAL | Status: AC
  Administered 2011-06-30: 21 mg via TRANSDERMAL
  Filled 2011-06-30: qty 1

## 2011-06-30 MED ORDER — SERTRALINE HCL 50 MG PO TABS
200.0000 mg | ORAL_TABLET | Freq: Every day | ORAL | Status: DC
  Administered 2011-06-30 – 2011-07-02 (×3): 200 mg via ORAL
  Filled 2011-06-30: qty 2
  Filled 2011-06-30 (×2): qty 4
  Filled 2011-06-30: qty 2

## 2011-06-30 MED ORDER — ZIPRASIDONE MESYLATE 20 MG IM SOLR
10.0000 mg | Freq: Once | INTRAMUSCULAR | Status: AC
  Administered 2011-06-30: 10 mg via INTRAMUSCULAR
  Filled 2011-06-30: qty 20

## 2011-06-30 MED ORDER — DIAZEPAM 5 MG PO TABS
10.0000 mg | ORAL_TABLET | Freq: Once | ORAL | Status: AC
  Administered 2011-06-30: 10 mg via ORAL
  Filled 2011-06-30: qty 2

## 2011-06-30 MED ORDER — NICOTINE 21 MG/24HR TD PT24
21.0000 mg | MEDICATED_PATCH | Freq: Once | TRANSDERMAL | Status: AC
  Administered 2011-06-30: 21 mg via TRANSDERMAL

## 2011-06-30 MED ORDER — GABAPENTIN 400 MG PO CAPS
800.0000 mg | ORAL_CAPSULE | Freq: Four times a day (QID) | ORAL | Status: DC
  Administered 2011-06-30 – 2011-07-02 (×8): 800 mg via ORAL
  Filled 2011-06-30 (×12): qty 2

## 2011-06-30 MED ORDER — DIAZEPAM 5 MG PO TABS
10.0000 mg | ORAL_TABLET | Freq: Two times a day (BID) | ORAL | Status: DC | PRN
  Administered 2011-06-30: 10 mg via ORAL
  Filled 2011-06-30: qty 2

## 2011-06-30 MED ORDER — IBUPROFEN 400 MG PO TABS
600.0000 mg | ORAL_TABLET | Freq: Three times a day (TID) | ORAL | Status: DC | PRN
  Administered 2011-06-30 – 2011-07-02 (×3): 600 mg via ORAL
  Filled 2011-06-30 (×2): qty 2

## 2011-06-30 MED ORDER — DIAZEPAM 5 MG PO TABS
10.0000 mg | ORAL_TABLET | Freq: Three times a day (TID) | ORAL | Status: DC
  Administered 2011-06-30 – 2011-07-02 (×5): 10 mg via ORAL
  Filled 2011-06-30 (×3): qty 2

## 2011-06-30 MED ORDER — PREGABALIN 50 MG PO CAPS
100.0000 mg | ORAL_CAPSULE | Freq: Two times a day (BID) | ORAL | Status: DC
  Administered 2011-06-30 – 2011-07-02 (×5): 100 mg via ORAL
  Filled 2011-06-30 (×5): qty 2

## 2011-06-30 MED ORDER — ALBUTEROL SULFATE HFA 108 (90 BASE) MCG/ACT IN AERS: 2.0000 | INHALATION_SPRAY | Freq: Four times a day (QID) | RESPIRATORY_TRACT | Status: DC | PRN

## 2011-06-30 NOTE — ED Notes (Signed)
Pt requesting valium states that she takes 10mg  three times a day and it is only ordered twice a day, Dr. Colon Branch notified, additional orders given

## 2011-06-30 NOTE — ED Notes (Signed)
Pt given lunch tray, [employer] at bedside,

## 2011-06-30 NOTE — ED Notes (Signed)
Pt standing in hallway, cursing at staff and Hydrograph[employer] surveyor who is sitting w/ pt. Pt encouraged to stay in exam room, requested something to drink, sprite given, pt could be over heard cursing at staff, "mother-fucking bitches".

## 2011-06-30 NOTE — ED Notes (Signed)
Patient states that she wants to see the physician, Dr. Colon Branch notified.
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
## 2011-06-30 NOTE — ED Notes (Signed)
Patient states that she still wants to see the physician, that she does not belong at [facility] and that she wants to be accepted somewhere else. That she does not want to hurt herself or anyone else.

## 2011-06-30 NOTE — ED Notes (Signed)
Pt upset over being in er under IVC paperwork, explained to pt that [facility] would be in to talk to her and that [facility] , pt states that she has insurance and does not understand why she is [facility], explained to pt that some facility's would not take a pt that was aggressive even if they had insurance. Comfort measure provided, pt expressed understanding.

## 2011-06-30 NOTE — Progress Notes (Signed)
New IVC paper work completed and faxed to the [employer], and t. Patient declined by  [facility], [facility], [facility], [facility], and [facility].  Authorization obtained from [facility] for referral to the state hospital. Referral made to [facility]. Dr Lajean Saver aware of of progress made.

## 2011-06-30 NOTE — ED Notes (Signed)
Patient given ice water per request. 

## 2011-06-30 NOTE — ED Notes (Signed)
Patient states that the physician has still not been in to see her and that she as a psych patient has a right to see the doctor. Patient at the nurses station agitated and raising her voice. Security notified and present, patient returned to room crying.

## 2011-06-30 NOTE — ED Notes (Signed)
Patient resting in bed with eyes closed.  Equal rise and fall of chest noted.  PD remains with patient.
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
## 2011-06-30 NOTE — ED Notes (Signed)
At present, pt is cooperative and appropriate.  No distress noted, interacting appropriately with staff at present.

## 2011-06-30 NOTE — ED Notes (Signed)
Pt cooperative, requesting bible and writing paper, bible, writing paper and crayons given to pt.
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
## 2011-06-30 NOTE — ED Notes (Signed)
Notified by Yvetta Coder that patient will not be accepted at facility.
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
## 2011-06-30 NOTE — ED Notes (Signed)
Explained to patient that she had an xray done on 06/29/11 around 1530. She did not remember.

## 2011-07-01 MED ORDER — PREGABALIN 50 MG PO CAPS
ORAL_CAPSULE | ORAL | Status: AC
  Filled 2011-07-01: qty 2

## 2011-07-01 MED ORDER — GABAPENTIN 400 MG PO CAPS
ORAL_CAPSULE | ORAL | Status: AC
  Filled 2011-07-01: qty 2

## 2011-07-01 MED ORDER — NICOTINE 21 MG/24HR TD PT24
MEDICATED_PATCH | TRANSDERMAL | Status: AC
  Administered 2011-07-01: 21 mg via TRANSDERMAL
  Filled 2011-07-01: qty 1

## 2011-07-01 MED ORDER — TRAMADOL HCL 50 MG PO TABS
50.0000 mg | ORAL_TABLET | Freq: Once | ORAL | Status: AC
  Administered 2011-07-01: 50 mg via ORAL
  Filled 2011-07-01: qty 1

## 2011-07-01 MED ORDER — DIAZEPAM 5 MG PO TABS
ORAL_TABLET | ORAL | Status: AC
  Filled 2011-07-01: qty 2

## 2011-07-01 NOTE — ED Notes (Signed)
Dr. Colon Branch in to speak with patient. Dr. Colon Branch and Dr. Hyacinth Meeker spoke with each other and decided to let patient have an ink pen to use while in the ED. Per Dr. Colon Branch, patient should be re-evaluated by the ACT team or Tele-psych tomorrow.

## 2011-07-01 NOTE — ED Notes (Signed)
Patient standing at doorway screaming "I want to see the patient advocate." Asked patient what is wrong, patient stated she is being discriminated against because she is a psych patient. Advised patient that [facility] would be coming down to speak with her about it. Patient states she knows we are trying to get her a hospital bed and doesn't understand why it is taking so long because "I know the whole second floor is shut down and there are beds on that floor." Advised charge nurse.
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
## 2011-07-01 NOTE — ED Notes (Signed)
Pt asks if she can have visitors. Pt told she is told as long as she is under HIPPA we can not let anyone know she is here, so she will not be able to have visitors. Pt requests to be taken off HIPPA. Registration notified.

## 2011-07-01 NOTE — BH Assessment (Signed)
Assessment Note   Latasha [name] is an 36 y.o. female. Latasha [name] remains in the ED awaiting acceptance by [facility]. She came into the ED 06/29/11, stating that she did not want to live. She has just broken up with her boyfriend and may have developed lung Cancer . She became very agitated ,cursing ,fighting with staff, refusing treatment. She had to be sedated and law enforcement stayed with her. She has calmed down somewhat, but again gets upset when she does not get her way or treated in a timely manner. She still expects things to happen immediately. She continues to have suicidal thoughts. She is not homicidal. She ins not psychotic.Patient requested not to be sent to [facility] and asked that she be referred to local hospitals.  Axis I: Bipolar Disorder;ADHD;PTSD Axis II: Borderline Personality Dis. Axis III:  Past Medical History  Diagnosis Date  . Seizure   . High cholesterol   . ADHD (attention deficit hyperactivity disorder)   . Bipolar disorder   . Asthma   . Pneumonia    Axis IV: other psychosocial or environmental problems, problems with access to health care services and problems with primary support group Axis V: 21-30 behavior considerably influenced by delusions or hallucinations OR serious impairment in judgment, communication OR inability to function in almost all areas  Past Medical History:  Past Medical History  Diagnosis Date  . Seizure   . High cholesterol   . ADHD (attention deficit hyperactivity disorder)   . Bipolar disorder   . Asthma   . Pneumonia     Past Surgical History  Procedure Date  . Abdominal hysterectomy   . Ovary removed bilateral  . Leg surgery left leg  . Facial reconstructive      Family History:  Family History  Problem Relation Age of Onset  . Diabetes Other   . Heart failure Other     Social History:  reports that she has been smoking Cigarettes.  She has a 10 pack-year smoking history. She has never used smokeless tobacco. She reports  that she uses illicit drugs (Marijuana and Cocaine). She reports that she does not drink alcohol.  Additional Social History:  Alcohol / Drug Use History of alcohol / drug use?: Yes Substance #1 Name of Substance 1: THC 1 - Age of First Use: 15 1 - Amount (size/oz): SEVERAL JOINTS 1 - Frequency: DAILY 1 - Duration: VARIES 1 - Last Use / Amount: 06/26/11 Substance #2 Name of Substance 2: COCAINE (POWDER) & CRACK  2 - Age of First Use: 24 2 - Amount (size/oz): ANY AMT 2 - Frequency: VARIES 2 - Duration: VARIES 2 - Last Use / Amount: 4/29 Allergies:  Allergies  Allergen Reactions  . Aripiprazole Other (See Comments)    Jerks, Locks up jaws.   . Cephalexin Nausea And Vomiting  . Haldol (Haloperidol Decanoate) Swelling and Other (See Comments)    Locks up jaws  . Tegretol (Carbamazepine) Other (See Comments)    Blisters, Sores  . Zofran Other (See Comments)    Locks up jaw, restlessness   . Ketorolac Tromethamine Rash    Home Medications:  (Not in a hospital admission)  OB/GYN Status:  No LMP recorded. Patient has had a hysterectomy.  General Assessment Data Location of Assessment: [facility] ACT Assessment: Yes Living Arrangements: Other relatives Can pt return to current living arrangement?: Yes Admission Status: Involuntary Is patient capable of signing voluntary admission?: No Transfer from: Acute Hospital Referral Source: Self/Family/Friend  Risk to self Suicidal Ideation: Yes-Currently Present Suicidal Intent: No Is patient at risk for suicide?: Yes Suicidal Plan?: No Access to Means: No What has been your use of drugs/alcohol within the last 12 months?: patient admits tooccassional use of both marijuana and cocaine. used pot about 4 days ago, no regular use of cocaine Previous Attempts/Gestures: Yes How many times?: 8  Other Self Harm Risks: no Triggers for Past Attempts: Other personal contacts;Family contact;Unpredictable Intentional Self Injurious  Behavior: None Family Suicide History: No Recent stressful life event(s): Conflict (Comment);Loss (Comment);Financial Problems;Trauma (Comment) Persecutory voices/beliefs?: No Depression: Yes Depression Symptoms: Loss of interest in usual pleasures;Feeling angry/irritable Substance abuse history and/or treatment for substance abuse?: Yes Suicide prevention information given to non-admitted patients: Not applicable  Risk to Others Homicidal Ideation: No Thoughts of Harm to Others: No Current Homicidal Intent: No Current Homicidal Plan: No Access to Homicidal Means: No Identified Victim: NA History of harm to others?: No Assessment of Violence: On admission Violent Behavior Description: yelling curing,threatening staff, threats of physical assult Does patient have access to weapons?: No Criminal Charges Pending?: No Does patient have a court date: No  Psychosis Hallucinations: None noted Delusions: None noted  Mental Status Report Appear/Hygiene: Poor hygiene;Disheveled Eye Contact: Fair Motor Activity: Agitation;Restlessness;Freedom of movement Speech: Argumentative;Loud Level of Consciousness: Alert;Restless;Irritable Mood: Depressed;Anxious;Irritable Affect: Appropriate to circumstance;Depressed;Irritable;Angry Anxiety Level: Minimal Thought Processes: Coherent Judgement: Unimpaired Orientation: Person;Place;Time;Situation Obsessive Compulsive Thoughts/Behaviors: Minimal  Cognitive Functioning Concentration: Decreased Memory: Recent Intact;Remote Intact IQ: Average Insight: Poor Impulse Control: Poor Appetite: Good Weight Loss: 0  Weight Gain: 0  Sleep: No Change Total Hours of Sleep: 8  Vegetative Symptoms: None  Prior Inpatient Therapy Prior Inpatient Therapy: Yes Prior Therapy Dates: 2012, 2013, 2011 Prior Therapy Facilty/Provider(s): [facility] Reason for Treatment: STABILIZATION  Prior Outpatient Therapy Prior Outpatient Therapy:  Yes Prior Therapy Dates: CURRENT Prior Therapy Facilty/Provider(s): DR. Guss Bunde Reason for Treatment: MED MANAGEMENT            Values / Beliefs Cultural Requests During Hospitalization: No blood transfusion (add FYI) Spiritual Requests During Hospitalization: None     Nutrition Screen Diet: Regular  Additional Information 1:1 In Past 12 Months?: No CIRT Risk: Yes Elopement Risk: No Does patient have medical clearance?: Yes     Disposition: Patient remains IVC. She remains on the priority list at [facility]. At patient's request she was referred to local hospitals. She was again declined due to her acuity. Dr Preston Fleeting mad aware of this dispositon.  Disposition Disposition of Patient: Inpatient treatment program;Referred to Type of inpatient treatment program: Adult Patient referred to: [facility]  On Site Evaluation by:   Reviewed with Physician:     Jearld Pies 07/01/2011 12:19 PM

## 2011-07-01 NOTE — ED Notes (Signed)
Dr Colon Branch requested to call [facility] and advise her that patient is feeling discriminated against because she is a psych patient. Called [facility] and advised.

## 2011-07-01 NOTE — ED Notes (Signed)
Pt assisted to shower with two Nurse Tech's and security.

## 2011-07-01 NOTE — ED Notes (Signed)
Patient states "I need to see a doctor. My back is killing me." Advised Dr Colon Branch.

## 2011-07-01 NOTE — ED Notes (Addendum)
Pt agitated because another pt is leaving before her. RN stated to pt that we have no control over another pt's care. Pt began clenching teeth and fists, yells "this is mother fucking bullshit". Security called to bedside. Pt yells it is unfair that she has to go to [facility] and states "I only have to go there because I was acting out. I'm being good now, but now I'm gonna act out since I gotta go there anyway". Pt states "I gotta go there because y'all been writing all that bullshit in my chart." RN offers pt her 1600 medications-nicotene patch, valium, neurontin. Pt takes medications without incident and asks RN and security to leave room. Sitter remains at bedside.

## 2011-07-01 NOTE — Progress Notes (Signed)
Patient is on the waiting list at [facility]. 2310 Patient asked that she be re-evaluated in the morning for placement nearby. She recognizes her behavior yesterday was violent and inappropriate. Will have ACT see patient in the AM. No behavioral issues on this shift.

## 2011-07-02 MED ORDER — NICOTINE 21 MG/24HR TD PT24
21.0000 mg | MEDICATED_PATCH | Freq: Once | TRANSDERMAL | Status: AC
  Administered 2011-07-01 – 2011-07-02 (×2): 21 mg via TRANSDERMAL

## 2011-07-02 MED ORDER — NICOTINE 21 MG/24HR TD PT24
MEDICATED_PATCH | TRANSDERMAL | Status: AC
  Filled 2011-07-02: qty 1

## 2011-07-02 MED ORDER — DIAZEPAM 5 MG PO TABS
ORAL_TABLET | ORAL | Status: AC
  Administered 2011-07-02: 10 mg via ORAL
  Filled 2011-07-02: qty 2

## 2011-07-02 MED ORDER — IBUPROFEN 400 MG PO TABS
ORAL_TABLET | ORAL | Status: AC
  Filled 2011-07-02: qty 2

## 2011-07-02 NOTE — Progress Notes (Signed)
8:04 AM Pt awake and alert.  She is calm and is reading in her room.  She requests to be released, saying that when she is taking her Risperdal she is all right.  She attributes her break several days ago to breakup of a relationship and to failure to take her medication.  She is currently waiting for a bed at [facility].  Will review her status with Frances Maywood, Behavioral Health counselor.

## 2011-07-02 NOTE — ED Notes (Signed)
Patient lying in bed sleeping. Rise and fall of chest noted. Sitter at bedside. No obvious distress noted at this time.

## 2011-07-02 NOTE — ED Notes (Signed)
[facility] called, pt has been accepted.  Called [employer] for transport.

## 2011-07-02 NOTE — ED Notes (Signed)
Pt c/o continued back ache and headache, requesting motrin

## 2011-07-02 NOTE — ED Notes (Addendum)
Ambulated with patient to bathroom. Patient states she voided on herself in the bed before she could get to the bathroom. Patient given new scrub pants and clean linen on bed.

## 2011-07-02 NOTE — ED Notes (Signed)
Pt given breakfast tray, pt cooperative, sitter at bedside,

## 2011-07-02 NOTE — ED Notes (Signed)
telepsych computer placed on pt room, SOC contacted and information faxed to them

## 2011-07-02 NOTE — Progress Notes (Signed)
12:18 PM Pt has been accepted for transfer to [facility] by Domenic Moras, R.N>

## 2011-07-02 NOTE — BH Assessment (Signed)
Assessment Note   Latasha [name] is an 36 y.o. female. Latasha [name] has remained in the Ed since being admitted to the ED. She is awaiting a bed at [facility]. Today Dr. Ignacia Palma has some question as to if she continues to need inpatient. He is requesting a tele-psych. The patient remains agitated whenever she doesn't get what she wants.  Las night she wanted to file a discrimination case against the ED and it's staff.  He is requesting a tele-psych to determine if she still needs inpatient treatment. Today she is calm most of the time. She denies SI or HI She not psychotic.  Axis I: Bipolar, mixed Axis II: Borderline Personality Dis. Axis III:  Past Medical History  Diagnosis Date  . Seizure   . High cholesterol   . ADHD (attention deficit hyperactivity disorder)   . Bipolar disorder   . Asthma   . Pneumonia    Axis IV: housing problems, other psychosocial or environmental problems, problems related to social environment, problems with access to health care services and problems with primary support group Axis V: 31-40 impairment in reality testing  Past Medical History:  Past Medical History  Diagnosis Date  . Seizure   . High cholesterol   . ADHD (attention deficit hyperactivity disorder)   . Bipolar disorder   . Asthma   . Pneumonia     Past Surgical History  Procedure Date  . Abdominal hysterectomy   . Ovary removed bilateral  . Leg surgery left leg  . Facial reconstructive      Family History:  Family History  Problem Relation Age of Onset  . Diabetes Other   . Heart failure Other     Social History:  reports that she has been smoking Cigarettes.  She has a 10 pack-year smoking history. She has never used smokeless tobacco. She reports that she uses illicit drugs (Marijuana and Cocaine). She reports that she does not drink alcohol.  Additional Social History:  Alcohol / Drug Use History of alcohol / drug use?: Yes Substance #1 Name of Substance 1: THC 1 - Age of First  Use: 15 1 - Amount (size/oz): SEVERAL JOINTS 1 - Frequency: DAILY 1 - Duration: VARIES 1 - Last Use / Amount: 06/26/11 Substance #2 Name of Substance 2: COCAINE (POWDER) & CRACK  2 - Age of First Use: 24 2 - Amount (size/oz): ANY AMT 2 - Frequency: VARIES 2 - Duration: VARIES 2 - Last Use / Amount: 4/29 Allergies:  Allergies  Allergen Reactions  . Aripiprazole Other (See Comments)    Jerks, Locks up jaws.   . Cephalexin Nausea And Vomiting  . Haldol (Haloperidol Decanoate) Swelling and Other (See Comments)    Locks up jaws  . Tegretol (Carbamazepine) Other (See Comments)    Blisters, Sores  . Zofran Other (See Comments)    Locks up jaw, restlessness   . Ketorolac Tromethamine Rash    Home Medications:  (Not in a hospital admission)  OB/GYN Status:  No LMP recorded. Patient has had a hysterectomy.  General Assessment Data Location of Assessment: [facility] ACT Assessment: Yes Living Arrangements: Other relatives Can pt return to current living arrangement?: Yes Admission Status: Involuntary Is patient capable of signing voluntary admission?: No Transfer from: Acute Hospital Referral Source: Self/Family/Friend     Risk to self Suicidal Ideation: Yes-Currently Present Suicidal Intent: No Is patient at risk for suicide?: Yes Suicidal Plan?: No Access to Means: No What has been your use of drugs/alcohol within  the last 12 months?: patient admits tooccassional use of both marijuana and cocaine. used pot about 4 days ago, no regular use of cocaine Previous Attempts/Gestures: Yes How many times?: 8  Other Self Harm Risks: no Triggers for Past Attempts: Other personal contacts;Family contact;Unpredictable Intentional Self Injurious Behavior: None Family Suicide History: No Recent stressful life event(s): Conflict (Comment);Loss (Comment);Financial Problems;Trauma (Comment) Persecutory voices/beliefs?: No Depression: Yes Depression Symptoms: Loss of interest in usual  pleasures;Feeling angry/irritable Substance abuse history and/or treatment for substance abuse?: Yes Suicide prevention information given to non-admitted patients: Not applicable  Risk to Others Homicidal Ideation: No Thoughts of Harm to Others: No Current Homicidal Intent: No Current Homicidal Plan: No Access to Homicidal Means: No Identified Victim: NA History of harm to others?: No Assessment of Violence: On admission Violent Behavior Description: yelling curing,threatening staff, threats of physical assult Does patient have access to weapons?: No Criminal Charges Pending?: No Does patient have a court date: No  Psychosis Hallucinations: None noted Delusions: None noted  Mental Status Report Appear/Hygiene: Poor hygiene;Disheveled Eye Contact: Fair Motor Activity: Agitation;Restlessness;Freedom of movement Speech: Argumentative;Loud Level of Consciousness: Alert;Restless;Irritable Mood: Depressed;Anxious;Irritable Affect: Appropriate to circumstance;Depressed;Irritable;Angry Anxiety Level: Minimal Thought Processes: Coherent Judgement: Unimpaired Orientation: Person;Place;Time;Situation Obsessive Compulsive Thoughts/Behaviors: Minimal  Cognitive Functioning Concentration: Decreased Memory: Recent Intact;Remote Intact IQ: Average Insight: Poor Impulse Control: Poor Appetite: Good Weight Loss: 0  Weight Gain: 0  Sleep: No Change Total Hours of Sleep: 8  Vegetative Symptoms: None  Prior Inpatient Therapy Prior Inpatient Therapy: Yes Prior Therapy Dates: 2012, 2013, 2011 Prior Therapy Facilty/Provider(s): [facility] Reason for Treatment: STABILIZATION  Prior Outpatient Therapy Prior Outpatient Therapy: Yes Prior Therapy Dates: CURRENT Prior Therapy Facilty/Provider(s): DR. Guss Bunde Reason for Treatment: MED MANAGEMENT            Values / Beliefs Cultural Requests During Hospitalization: No blood transfusion (add FYI) Spiritual  Requests During Hospitalization: None     Nutrition Screen Diet: Regular  Additional Information 1:1 In Past 12 Months?: No CIRT Risk: Yes Elopement Risk: No Does patient have medical clearance?: Yes     Disposition: Before tele-psych could begin,the patient was accepted to [facility]. Domenic Moras RN called to inform us that the patient could now be transported to [facility]. [employer] called to transport patient to [facility]. Dr Ignacia Palma is in agreement with this disposition.l Tele-psych was canceled.  Disposition Disposition of Patient: Inpatient treatment program;Referred to Type of inpatient treatment program: Adult Patient referred to: [facility]  On Site Evaluation by:   Reviewed with Physician:     Jearld Pies 07/02/2011 3:25 PM

## 2011-07-02 NOTE — ED Notes (Signed)
Dr. Ignacia Palma in room talking with pt.

## 2011-07-02 NOTE — ED Notes (Signed)
Went into pt room with Latasha [name] ACT, pt calm cooperative, denies any SI/HI at present time, denies any Visual hallucinations or auditory hallucinations

## 2011-07-02 NOTE — ED Notes (Signed)
Hospital bed brought to patient and placed in room.

## 2011-11-27 ENCOUNTER — Emergency Department (HOSPITAL_COMMUNITY)
Admission: EM | Admit: 2011-11-27 | Discharge: 2011-11-27 | Disposition: A | Discharge status: Home / Self Care | Payer: PRIVATE HEALTH INSURANCE | Attending: Emergency Medicine | Admitting: Emergency Medicine

## 2011-11-27 ENCOUNTER — Encounter (HOSPITAL_COMMUNITY): Payer: Self-pay | Admitting: Emergency Medicine

## 2011-11-27 DIAGNOSIS — J45909 Unspecified asthma, uncomplicated: Secondary | ICD-10-CM | POA: Insufficient documentation

## 2011-11-27 DIAGNOSIS — K089 Disorder of teeth and supporting structures, unspecified: Secondary | ICD-10-CM | POA: Insufficient documentation

## 2011-11-27 DIAGNOSIS — Z881 Allergy status to other antibiotic agents status: Secondary | ICD-10-CM | POA: Insufficient documentation

## 2011-11-27 DIAGNOSIS — B370 Candidal stomatitis: Secondary | ICD-10-CM | POA: Insufficient documentation

## 2011-11-27 DIAGNOSIS — Z79899 Other long term (current) drug therapy: Secondary | ICD-10-CM | POA: Insufficient documentation

## 2011-11-27 DIAGNOSIS — F319 Bipolar disorder, unspecified: Secondary | ICD-10-CM | POA: Insufficient documentation

## 2011-11-27 DIAGNOSIS — F17200 Nicotine dependence, unspecified, uncomplicated: Secondary | ICD-10-CM | POA: Insufficient documentation

## 2011-11-27 DIAGNOSIS — Z8701 Personal history of pneumonia (recurrent): Secondary | ICD-10-CM | POA: Insufficient documentation

## 2011-11-27 DIAGNOSIS — K0889 Other specified disorders of teeth and supporting structures: Secondary | ICD-10-CM

## 2011-11-27 DIAGNOSIS — F909 Attention-deficit hyperactivity disorder, unspecified type: Secondary | ICD-10-CM | POA: Insufficient documentation

## 2011-11-27 DIAGNOSIS — IMO0002 Reserved for concepts with insufficient information to code with codable children: Secondary | ICD-10-CM | POA: Insufficient documentation

## 2011-11-27 DIAGNOSIS — Z888 Allergy status to other drugs, medicaments and biological substances status: Secondary | ICD-10-CM | POA: Insufficient documentation

## 2011-11-27 DIAGNOSIS — E7800 Pure hypercholesterolemia, unspecified: Secondary | ICD-10-CM | POA: Insufficient documentation

## 2011-11-27 DIAGNOSIS — Z886 Allergy status to analgesic agent status: Secondary | ICD-10-CM | POA: Insufficient documentation

## 2011-11-27 MED ORDER — HYDROCODONE-ACETAMINOPHEN 5-325 MG PO TABS: 1.0000 | ORAL_TABLET | Freq: Four times a day (QID) | ORAL | Status: AC | PRN

## 2011-11-27 MED ORDER — FLUCONAZOLE 200 MG PO TABS: 200.0000 mg | ORAL_TABLET | Freq: Every day | ORAL | Status: AC

## 2011-11-27 NOTE — ED Notes (Signed)
Patient with no complaints at this time. Respirations even and unlabored. Skin warm/dry. Discharge instructions reviewed with patient at this time. Patient given opportunity to voice concerns/ask questions. Patient discharged at this time and left Emergency Department with steady gait.   

## 2011-11-27 NOTE — ED Provider Notes (Signed)
History     CSN: [id number]  Arrival date & time 11/27/11  1038   First MD Initiated Contact with Patient 11/27/11 1112      Chief Complaint  Patient presents with  . Dental Pain    (Consider location/radiation/quality/duration/timing/severity/associated sxs/prior treatment) HPI Comments: Pt recently released from hospital for tx of pneumonia.  Currently taking levaquin 750 mg and prednisone taper.  Has widespread dental pain and has appt to see oral surgeon on 12-02-11.  She also has thrush.  Patient is a 36 y.o. female presenting with tooth pain. The history is provided by the patient. No language interpreter was used.  Dental PainThe primary symptoms include mouth pain. Primary symptoms do not include dental injury or fever. The symptoms are worsening. The symptoms occur constantly.  Additional symptoms do not include: gum swelling and facial swelling.    Past Medical History  Diagnosis Date  . Seizure   . High cholesterol   . ADHD (attention deficit hyperactivity disorder)   . Bipolar disorder   . Asthma   . Pneumonia     Past Surgical History  Procedure Date  . Abdominal hysterectomy   . Ovary removed bilateral  . Leg surgery left leg  . Facial reconstructive      Family History  Problem Relation Age of Onset  . Diabetes Other   . Heart failure Other     History  Substance Use Topics  . Smoking status: Current Some Day Smoker -- 0.5 packs/day for 20 years    Types: Cigarettes  . Smokeless tobacco: Never Used  . Alcohol Use: No    OB History    Grav Para Term Preterm Abortions TAB SAB Ect Mult Living   6 3  3 3  3   3       Review of Systems  Constitutional: Negative for fever and chills.  HENT: Positive for dental problem. Negative for facial swelling.   All other systems reviewed and are negative.    Allergies  Aripiprazole; Cephalexin; Haldol; Tegretol; Zofran; and Ketorolac tromethamine  Home Medications   Current Outpatient Rx  Name  Route Sig Dispense Refill  . ACETAMINOPHEN 500 MG PO TABS Oral Take 1,000 mg by mouth every 6 (six) hours as needed. Dental pain.    . ALBUTEROL SULFATE HFA 108 (90 BASE) MCG/ACT IN AERS Inhalation Inhale 2 puffs into the lungs every 6 (six) hours as needed. Shortness of breath.    Marland Kitchen DIAZEPAM 10 MG PO TABS Oral Take 10 mg by mouth 3 (three) times daily.    Marland Kitchen GABAPENTIN 800 MG PO TABS Oral Take 800 mg by mouth 4 (four) times daily.      . IBUPROFEN 200 MG PO TABS Oral Take 800 mg by mouth every 4 (four) hours as needed. pain    . LEVOFLOXACIN 750 MG PO TABS Oral Take 750 mg by mouth daily.    Marland Kitchen PREDNISONE 10 MG PO KIT Oral Take 10-60 mg by mouth daily. (dose pack)    . RISPERIDONE 3 MG PO TABS Oral Take 3 mg by mouth 2 (two) times daily.    . SERTRALINE HCL 100 MG PO TABS Oral Take 200 mg by mouth daily.     Jeananne Rama SULFATE 0.05-0.25 % OP SOLN Both Eyes Place 2 drops into both eyes 3 (three) times daily as needed. Red Eyes    . FLUCONAZOLE 200 MG PO TABS Oral Take 1 tablet (200 mg total) by mouth daily. 1 tablet 1  .  HYDROCODONE-ACETAMINOPHEN 5-325 MG PO TABS Oral Take 1 tablet by mouth every 6 (six) hours as needed for pain. 20 tablet 0  . PREGABALIN 100 MG PO CAPS Oral Take 100 mg by mouth 2 (two) times daily.        BP 111/64  Pulse 83  Temp 98.1 F (36.7 C) (Oral)  Resp 18  Ht 5\' 8"  (1.727 m)  Wt 197 lb (89.359 kg)  BMI 29.95 kg/m2  SpO2 97%  Physical Exam  Nursing note and vitals reviewed. Constitutional: She is oriented to person, place, and time. She appears well-developed and well-nourished. No distress.  HENT:  Head: Normocephalic and atraumatic.  Mouth/Throat: Uvula is midline. Abnormal dentition. Dental caries present. No dental abscesses or uvula swelling. No oropharyngeal exudate, posterior oropharyngeal edema, posterior oropharyngeal erythema or tonsillar abscesses.       Widespread dental decay .  + thrush   Eyes: EOM are normal.  Neck: Normal range of  motion.  Cardiovascular: Normal rate, regular rhythm and normal heart sounds.   Pulmonary/Chest: Effort normal and breath sounds normal.  Abdominal: Soft. She exhibits no distension. There is no tenderness.  Musculoskeletal: Normal range of motion.  Neurological: She is alert and oriented to person, place, and time.  Skin: Skin is warm and dry.  Psychiatric: She has a normal mood and affect. Judgment normal.    ED Course  Procedures (including critical care time)  Labs Reviewed - No data to display No results found.   1. Pain, dental   2. Thrush       MDM  rx-fluconazole 200 mg x 1 rx-hydrocodone, 20 F/u with oral surgeon as planned.        Evalina Field, PA 11/27/11 1210

## 2011-11-27 NOTE — ED Notes (Signed)
Pt c/o dental pain and thrush. Pt states she just got out of [facility] for pneumonia and has thrush from antibiotics.

## 2011-11-29 NOTE — ED Provider Notes (Signed)
Medical screening examination/treatment/procedure(s) were performed by non-physician practitioner and as supervising physician I was immediately available for consultation/collaboration.   Carleene Cooper III, MD 11/29/11 [phone]

## 2012-04-26 ENCOUNTER — Encounter (HOSPITAL_COMMUNITY): Payer: Self-pay | Admitting: Emergency Medicine

## 2012-04-26 ENCOUNTER — Emergency Department (HOSPITAL_COMMUNITY): Payer: PRIVATE HEALTH INSURANCE

## 2012-04-26 ENCOUNTER — Emergency Department (HOSPITAL_COMMUNITY)
Admission: EM | Admit: 2012-04-26 | Discharge: 2012-04-26 | Disposition: A | Discharge status: Home / Self Care | Payer: PRIVATE HEALTH INSURANCE | Attending: Emergency Medicine | Admitting: Emergency Medicine

## 2012-04-26 DIAGNOSIS — W19XXXA Unspecified fall, initial encounter: Secondary | ICD-10-CM

## 2012-04-26 DIAGNOSIS — Y9389 Activity, other specified: Secondary | ICD-10-CM | POA: Insufficient documentation

## 2012-04-26 DIAGNOSIS — M25512 Pain in left shoulder: Secondary | ICD-10-CM

## 2012-04-26 DIAGNOSIS — Y9289 Other specified places as the place of occurrence of the external cause: Secondary | ICD-10-CM | POA: Insufficient documentation

## 2012-04-26 DIAGNOSIS — Z79899 Other long term (current) drug therapy: Secondary | ICD-10-CM | POA: Insufficient documentation

## 2012-04-26 DIAGNOSIS — F319 Bipolar disorder, unspecified: Secondary | ICD-10-CM | POA: Insufficient documentation

## 2012-04-26 DIAGNOSIS — F17200 Nicotine dependence, unspecified, uncomplicated: Secondary | ICD-10-CM | POA: Insufficient documentation

## 2012-04-26 DIAGNOSIS — S4980XA Other specified injuries of shoulder and upper arm, unspecified arm, initial encounter: Secondary | ICD-10-CM | POA: Insufficient documentation

## 2012-04-26 DIAGNOSIS — IMO0002 Reserved for concepts with insufficient information to code with codable children: Secondary | ICD-10-CM | POA: Insufficient documentation

## 2012-04-26 DIAGNOSIS — J45909 Unspecified asthma, uncomplicated: Secondary | ICD-10-CM | POA: Insufficient documentation

## 2012-04-26 DIAGNOSIS — W010XXA Fall on same level from slipping, tripping and stumbling without subsequent striking against object, initial encounter: Secondary | ICD-10-CM | POA: Insufficient documentation

## 2012-04-26 DIAGNOSIS — S46909A Unspecified injury of unspecified muscle, fascia and tendon at shoulder and upper arm level, unspecified arm, initial encounter: Secondary | ICD-10-CM | POA: Insufficient documentation

## 2012-04-26 DIAGNOSIS — F909 Attention-deficit hyperactivity disorder, unspecified type: Secondary | ICD-10-CM | POA: Insufficient documentation

## 2012-04-26 DIAGNOSIS — G40909 Epilepsy, unspecified, not intractable, without status epilepticus: Secondary | ICD-10-CM | POA: Insufficient documentation

## 2012-04-26 DIAGNOSIS — S0993XA Unspecified injury of face, initial encounter: Secondary | ICD-10-CM | POA: Insufficient documentation

## 2012-04-26 DIAGNOSIS — E7800 Pure hypercholesterolemia, unspecified: Secondary | ICD-10-CM | POA: Insufficient documentation

## 2012-04-26 DIAGNOSIS — Z8701 Personal history of pneumonia (recurrent): Secondary | ICD-10-CM | POA: Insufficient documentation

## 2012-04-26 DIAGNOSIS — Z9889 Other specified postprocedural states: Secondary | ICD-10-CM | POA: Insufficient documentation

## 2012-04-26 MED ORDER — OXYCODONE-ACETAMINOPHEN 5-325 MG PO TABS
2.0000 | ORAL_TABLET | Freq: Once | ORAL | Status: AC
  Administered 2012-04-26: 2 via ORAL
  Filled 2012-04-26: qty 2

## 2012-04-26 MED ORDER — OXYCODONE-ACETAMINOPHEN 5-325 MG PO TABS: ORAL_TABLET | ORAL | Status: DC

## 2012-04-26 NOTE — ED Notes (Signed)
Patient is alert and orientedx4.  Patient was explained discharge instructions and they understood them with no questions.  Encarnacion Chu, patient's fiancee's sister is coming to transport the patient home.

## 2012-04-26 NOTE — Progress Notes (Signed)
Orthopedic Tech Progress Note Patient Details:  Latasha [name] Aug 23, 1975 [id number]  Ortho Devices Type of Ortho Device: Arm sling Ortho Device/Splint Location: left arm Ortho Device/Splint Interventions: Application   Nikki Dom 04/26/2012, 4:05 PM

## 2012-04-26 NOTE — ED Notes (Signed)
Fell off porch (approx 2 feet) onto soil c/o right shoulder pain and back pain.

## 2012-04-26 NOTE — ED Notes (Addendum)
Pt reports fall yesterday because she lost her balance. States she fell on to her left arm and now has pain "all over" primarily in left shoulder. Pt also states numbness and tingling in bilateral hands.

## 2012-04-26 NOTE — ED Provider Notes (Signed)
History    This chart was scribed for non-physician practitioner Wynetta Emery, PA-C working with Ward Givens, MD by Gerlean Ren, ED Scribe. This patient was seen in room TR06C/TR06C and the patient's care was started at 3:51 PM.    CSN: [id number]  Arrival date & time 04/26/12  1412   First MD Initiated Contact with Patient 04/26/12 1425      Chief Complaint  Patient presents with  . Fall     The history is provided by the patient. No language interpreter was used.  Latasha [name] is a 37 y.o. female who presents to the Emergency Department complaining of constant sharp left shoulder and left hip pain with sudden onset after falling from her porch earlier today with mild head trauma at the left cheek but no LOC.  Pt has used Tramadol at home, prescribed for bulging discs, with no relief to pain.  Pt denies dyspnea, abdominal pain, shortness of breath.    Past Medical History  Diagnosis Date  . Seizure   . High cholesterol   . ADHD (attention deficit hyperactivity disorder)   . Bipolar disorder   . Asthma   . Pneumonia     Past Surgical History  Procedure Laterality Date  . Abdominal hysterectomy    . Ovary removed  bilateral  . Leg surgery  left leg  . Facial reconstructive       Family History  Problem Relation Age of Onset  . Diabetes Other   . Heart failure Other     History  Substance Use Topics  . Smoking status: Current Some Day Smoker -- 0.50 packs/day for 20 years    Types: Cigarettes  . Smokeless tobacco: Never Used  . Alcohol Use: No    OB History   Grav Para Term Preterm Abortions TAB SAB Ect Mult Living   6 3  3 3  3   3       Review of Systems  Constitutional: Negative for fever.  Respiratory: Negative for shortness of breath.   Cardiovascular: Negative for chest pain.  Gastrointestinal: Negative for nausea, vomiting, abdominal pain and diarrhea.  Musculoskeletal: Positive for back pain and arthralgias (left shoulder).  Skin: Negative for  wound.  All other systems reviewed and are negative.    Allergies  Aripiprazole; Cephalexin; Haldol; Tegretol; Zofran; and Ketorolac tromethamine  Home Medications   Current Outpatient Rx  Name  Route  Sig  Dispense  Refill  . albuterol (PROAIR HFA) 108 (90 BASE) MCG/ACT inhaler   Inhalation   Inhale 2 puffs into the lungs every 6 (six) hours as needed. Shortness of breath.         . diazepam (VALIUM) 10 MG tablet   Oral   Take 10 mg by mouth every 8 (eight) hours as needed for anxiety.          . gabapentin (NEURONTIN) 800 MG tablet   Oral   Take 800 mg by mouth 4 (four) times daily.           Marland Kitchen ibuprofen (ADVIL,MOTRIN) 200 MG tablet   Oral   Take 800 mg by mouth every 4 (four) hours as needed. pain         . pregabalin (LYRICA) 100 MG capsule   Oral   Take 100 mg by mouth 2 (two) times daily.           . risperiDONE (RISPERDAL) 3 MG tablet   Oral   Take 3 mg by mouth 2 (  two) times daily.         Marland Kitchen tiZANidine (ZANAFLEX) 4 MG tablet   Oral   Take 4 mg by mouth every 8 (eight) hours as needed. For pain         . traMADol (ULTRAM) 50 MG tablet   Oral   Take 50 mg by mouth every 6 (six) hours as needed for pain.           BP 122/64  Pulse 118  Temp(Src) 97.8 F (36.6 C) (Oral)  Resp 22  SpO2 97%  Physical Exam  Nursing note and vitals reviewed. Constitutional: She is oriented to person, place, and time. She appears well-developed and well-nourished. No distress.  HENT:  Head: Normocephalic.  Eyes: Conjunctivae and EOM are normal.  Neck:  No midline tenderness to palpation or stepoffs appreciated  Cardiovascular: Normal rate.   Left radial pulse 2+, cap refill<2 seconds  Pulmonary/Chest: Effort normal and breath sounds normal. No stridor. No respiratory distress. She has no wheezes. She has no rales. She exhibits no tenderness.  Moving good air in all fields  Abdominal: Soft. There is no tenderness.  Musculoskeletal: Normal range of  motion.  No deformity to left shoulder, no clavicular deformity or tenderness, severely limited left shoulder ROM, full ROM of elbow, full ROM of fingers  Neurological: She is alert and oriented to person, place, and time.  Psychiatric: She has a normal mood and affect.    ED Course  Procedures (including critical care time) DIAGNOSTIC STUDIES: Oxygen Saturation is 97% on room air, adequate by my interpretation.    COORDINATION OF CARE: 3:56 PM- Informed pt that XR was negative for fractures.  Discussed several day prescription for Percocet but that follow-up with PCP is necessary.  Pt requests left shoulder immobilizing sling.  I agreed but informed pt that she should use it sparingly over the next 2 days to avoid frozen shoulder.   Dg Shoulder Left  04/26/2012  *RADIOLOGY REPORT*  Clinical Data: Fall 2 days ago.  Shoulder pain.  LEFT SHOULDER - 2+ VIEW  Comparison: Plain films 12/13/2010.  Findings: The humerus is located and the acromioclavicular joint is intact with only mild degenerative change seen.  There is no fracture.  Imaged left lung and ribs appear normal.  IMPRESSION: Negative exam.   Original Report Authenticated By: Holley Dexter, M.D.      1. Left shoulder pain   2. Fall, initial encounter       MDM   Negative x-rays, muscululoskeletal pain status post slip and fall uncomplicated  Filed Vitals:   04/26/12 1421 04/26/12 1615  BP: 122/64 118/79  Pulse: 118 104  Temp: 97.8 F (36.6 C) 98 F (36.7 C)  TempSrc: Oral Oral  Resp: 22 18  SpO2: 97% 94%     Pt verbalized understanding and agrees with care plan. Outpatient follow-up and return precautions given.    Discharge Medication List as of 04/26/2012  4:20 PM    START taking these medications   Details  oxyCODONE-acetaminophen (PERCOCET/ROXICET) 5-325 MG per tablet 1 to 2 tabs PO q6hrs  PRN for pain, Print        I personally performed the services described in this documentation, which was scribed  in my presence. The recorded information has been reviewed and is accurate.         Wynetta Emery, PA-C 04/26/12 1710

## 2012-04-26 NOTE — ED Notes (Addendum)
Patient transported to FT-06 from X-ray.

## 2012-04-27 NOTE — ED Provider Notes (Signed)
Medical screening examination/treatment/procedure(s) were performed by non-physician practitioner and as supervising physician I was immediately available for consultation/collaboration. Devoria Albe, MD, FACEP   Ward Givens, MD 04/27/12 1027

## 2012-08-30 ENCOUNTER — Emergency Department (HOSPITAL_COMMUNITY): Payer: PRIVATE HEALTH INSURANCE

## 2012-08-30 ENCOUNTER — Emergency Department (HOSPITAL_COMMUNITY)
Admission: EM | Admit: 2012-08-30 | Discharge: 2012-08-30 | Disposition: A | Discharge status: Home / Self Care | Payer: PRIVATE HEALTH INSURANCE | Attending: Emergency Medicine | Admitting: Emergency Medicine

## 2012-08-30 ENCOUNTER — Encounter (HOSPITAL_COMMUNITY): Payer: Self-pay

## 2012-08-30 DIAGNOSIS — Z862 Personal history of diseases of the blood and blood-forming organs and certain disorders involving the immune mechanism: Secondary | ICD-10-CM | POA: Insufficient documentation

## 2012-08-30 DIAGNOSIS — J45909 Unspecified asthma, uncomplicated: Secondary | ICD-10-CM | POA: Insufficient documentation

## 2012-08-30 DIAGNOSIS — Z79899 Other long term (current) drug therapy: Secondary | ICD-10-CM | POA: Insufficient documentation

## 2012-08-30 DIAGNOSIS — H53149 Visual discomfort, unspecified: Secondary | ICD-10-CM | POA: Insufficient documentation

## 2012-08-30 DIAGNOSIS — Z8701 Personal history of pneumonia (recurrent): Secondary | ICD-10-CM | POA: Insufficient documentation

## 2012-08-30 DIAGNOSIS — F319 Bipolar disorder, unspecified: Secondary | ICD-10-CM | POA: Insufficient documentation

## 2012-08-30 DIAGNOSIS — R110 Nausea: Secondary | ICD-10-CM | POA: Insufficient documentation

## 2012-08-30 DIAGNOSIS — IMO0002 Reserved for concepts with insufficient information to code with codable children: Secondary | ICD-10-CM | POA: Insufficient documentation

## 2012-08-30 DIAGNOSIS — Z8659 Personal history of other mental and behavioral disorders: Secondary | ICD-10-CM | POA: Insufficient documentation

## 2012-08-30 DIAGNOSIS — F17200 Nicotine dependence, unspecified, uncomplicated: Secondary | ICD-10-CM | POA: Insufficient documentation

## 2012-08-30 DIAGNOSIS — R5381 Other malaise: Secondary | ICD-10-CM | POA: Insufficient documentation

## 2012-08-30 DIAGNOSIS — Q674 Other congenital deformities of skull, face and jaw: Secondary | ICD-10-CM | POA: Insufficient documentation

## 2012-08-30 DIAGNOSIS — G43109 Migraine with aura, not intractable, without status migrainosus: Secondary | ICD-10-CM | POA: Insufficient documentation

## 2012-08-30 DIAGNOSIS — R51 Headache: Secondary | ICD-10-CM | POA: Insufficient documentation

## 2012-08-30 DIAGNOSIS — R29810 Facial weakness: Secondary | ICD-10-CM | POA: Insufficient documentation

## 2012-08-30 DIAGNOSIS — R5383 Other fatigue: Secondary | ICD-10-CM | POA: Insufficient documentation

## 2012-08-30 DIAGNOSIS — G40909 Epilepsy, unspecified, not intractable, without status epilepticus: Secondary | ICD-10-CM | POA: Insufficient documentation

## 2012-08-30 DIAGNOSIS — Z8639 Personal history of other endocrine, nutritional and metabolic disease: Secondary | ICD-10-CM | POA: Insufficient documentation

## 2012-08-30 LAB — BASIC METABOLIC PANEL
CO2: 24 mEq/L (ref 19–32)
Calcium: 10.1 mg/dL (ref 8.4–10.5)
Potassium: 3.9 mEq/L (ref 3.5–5.1)
Sodium: 136 mEq/L (ref 135–145)

## 2012-08-30 LAB — CBC WITH DIFFERENTIAL/PLATELET
Basophils Absolute: 0 10*3/uL (ref 0.0–0.1)
Eosinophils Relative: 1 % (ref 0–5)
Lymphocytes Relative: 31 % (ref 12–46)
MCV: 87 fL (ref 78.0–100.0)
Neutro Abs: 6.6 10*3/uL (ref 1.7–7.7)
Neutrophils Relative %: 63 % (ref 43–77)
Platelets: 161 10*3/uL (ref 150–400)
RBC: 5.25 MIL/uL — ABNORMAL HIGH (ref 3.87–5.11)
RDW: 12.9 % (ref 11.5–15.5)
WBC: 10.5 10*3/uL (ref 4.0–10.5)

## 2012-08-30 MED ORDER — LORAZEPAM 2 MG/ML IJ SOLN
1.0000 mg | Freq: Once | INTRAMUSCULAR | Status: AC
  Administered 2012-08-30: 1 mg via INTRAVENOUS
  Filled 2012-08-30: qty 1

## 2012-08-30 MED ORDER — DIPHENHYDRAMINE HCL 50 MG/ML IJ SOLN
25.0000 mg | Freq: Once | INTRAMUSCULAR | Status: AC
  Administered 2012-08-30: 25 mg via INTRAVENOUS
  Filled 2012-08-30: qty 1

## 2012-08-30 MED ORDER — HYDROMORPHONE HCL PF 1 MG/ML IJ SOLN
1.0000 mg | Freq: Once | INTRAMUSCULAR | Status: AC
  Administered 2012-08-30: 1 mg via INTRAVENOUS
  Filled 2012-08-30: qty 1

## 2012-08-30 MED ORDER — SODIUM CHLORIDE 0.9 % IV SOLN
INTRAVENOUS | Status: DC
  Administered 2012-08-30: 100 mL via INTRAVENOUS

## 2012-08-30 MED ORDER — PROMETHAZINE HCL 25 MG/ML IJ SOLN
12.5000 mg | Freq: Once | INTRAMUSCULAR | Status: AC
  Administered 2012-08-30: 12.5 mg via INTRAVENOUS
  Filled 2012-08-30: qty 1

## 2012-08-30 MED ORDER — SODIUM CHLORIDE 0.9 % IV BOLUS (SEPSIS)
1000.0000 mL | Freq: Once | INTRAVENOUS | Status: AC
  Administered 2012-08-30: 1000 mL via INTRAVENOUS

## 2012-08-30 NOTE — ED Notes (Signed)
Asked by Merilyn Baba, RN to go speak with patient due to patient being upset.  I went in to speak with patient and she continually interrupted me while trying to explain to patient that she is waiting on doctor to look at lab values and CT scan results.  Patient continuously stated that RN stated she was not important.  I informed patient that is not the case and that she has been assessed and treated by the doctor and that the doctor was now seeing other emergency cases that had come in.  Patient became verbally abusive to me and continued to state that we do not care about her.  I informed her that we do care about and that I would speak to doctor about coming to talk to her; I did inform her that he may not be in in the next 5 minutes, but that he would come in to talk to her.  I apologized once again for her perception of the conversations.

## 2012-08-30 NOTE — ED Provider Notes (Signed)
History    CSN: [id number] Arrival date & time 08/30/12  1754  First MD Initiated Contact with Patient 08/30/12 1802     Chief Complaint  Patient presents with  . Seizures   (Consider location/radiation/quality/duration/timing/severity/associated sxs/prior Treatment) The history is provided by the patient and the EMS personnel.   37 year old female. Dr. Loney Hering is her doctor in the [place] area. Patient with onset of headache that started shortly before arrival. EMS got involved there was some question of pseudoseizure or seizure activity however patient did not have a postictal phase. Patient ambulated to the EMS stretcher patient was alert and oriented x3 upon arrival here. Patient had some shaking but was awake. Patient does have a history of migraines. She is having severe headache anteriorly which is somewhat typical for her migraines. She has photophobia having some right facial droop that is atypical for her migraines. She also has nausea but no vomiting. Had pain is 10 out of 10 throbbing in nature nonradiating not made worse or better by anything.    Past Medical History  Diagnosis Date  . Seizure   . High cholesterol   . ADHD (attention deficit hyperactivity disorder)   . Bipolar disorder   . Asthma   . Pneumonia    Past Surgical History  Procedure Laterality Date  . Abdominal hysterectomy    . Ovary removed  bilateral  . Leg surgery  left leg  . Facial reconstructive      Family History  Problem Relation Age of Onset  . Diabetes Other   . Heart failure Other    History  Substance Use Topics  . Smoking status: Current Some Day Smoker -- 0.50 packs/day for 20 years    Types: Cigarettes  . Smokeless tobacco: Never Used  . Alcohol Use: No   OB History   Grav Para Term Preterm Abortions TAB SAB Ect Mult Living   6 3  3 3  3   3      Review of Systems  Constitutional: Negative for fever and chills.  HENT: Negative for congestion and neck pain.   Eyes: Positive  for photophobia.  Respiratory: Negative for shortness of breath.   Cardiovascular: Negative for chest pain.  Gastrointestinal: Positive for nausea. Negative for vomiting and abdominal pain.  Genitourinary: Negative for dysuria.  Musculoskeletal: Negative for back pain.  Skin: Negative for rash.  Neurological: Positive for facial asymmetry, weakness and headaches. Negative for seizures.  Hematological: Does not bruise/bleed easily.  Psychiatric/Behavioral: Positive for agitation. Negative for confusion.    Allergies  Aripiprazole; Cephalexin; Haldol; Tegretol; Zofran; and Ketorolac tromethamine  Home Medications   Current Outpatient Rx  Name  Route  Sig  Dispense  Refill  . albuterol (PROAIR HFA) 108 (90 BASE) MCG/ACT inhaler   Inhalation   Inhale 2 puffs into the lungs every 6 (six) hours as needed. Shortness of breath.         . Buprenorphine HCl-Naloxone HCl (SUBOXONE) 4-1 MG FILM   Sublingual   Place 1 Film under the tongue 3 (three) times daily.         . diazepam (VALIUM) 10 MG tablet   Oral   Take 10 mg by mouth every 8 (eight) hours as needed for anxiety.          . gabapentin (NEURONTIN) 800 MG tablet   Oral   Take 800 mg by mouth 4 (four) times daily.           Marland Kitchen ibuprofen (ADVIL,MOTRIN) 200  MG tablet   Oral   Take 800 mg by mouth every 4 (four) hours as needed. pain         . pregabalin (LYRICA) 100 MG capsule   Oral   Take 100 mg by mouth 2 (two) times daily.           . risperiDONE (RISPERDAL) 3 MG tablet   Oral   Take 3 mg by mouth 2 (two) times daily.          BP 107/68  Pulse 88  Temp(Src) 98.5 F (36.9 C) (Oral)  Resp 20  Wt 210 lb (95.255 kg)  BMI 31.94 kg/m2  SpO2 95% Physical Exam  Nursing note and vitals reviewed. Constitutional: She is oriented to person, place, and time. She appears well-developed and well-nourished. She appears distressed.  HENT:  Head: Normocephalic and atraumatic.  Mouth/Throat: Oropharynx is clear  and moist.  Eyes: Conjunctivae and EOM are normal. Pupils are equal, round, and reactive to light.  Neck: Normal range of motion. Neck supple.  Cardiovascular: Normal rate, regular rhythm and normal heart sounds.   Pulmonary/Chest: Effort normal and breath sounds normal.  Abdominal: Soft. Bowel sounds are normal. There is no tenderness.  Musculoskeletal: Normal range of motion.  Neurological: She is alert and oriented to person, place, and time. A cranial nerve deficit is present. She exhibits normal muscle tone. Coordination normal.  Patient with right-sided facial weakness predominantly around the mouth.  Skin: Skin is warm. No rash noted.    ED Course  Procedures (including critical care time) Labs Reviewed  CBC WITH DIFFERENTIAL - Abnormal; Notable for the following:    RBC 5.25 (*)    Hemoglobin 16.2 (*)    All other components within normal limits  BASIC METABOLIC PANEL  URINE RAPID DRUG SCREEN (HOSP PERFORMED)  URINALYSIS, ROUTINE W REFLEX MICROSCOPIC     Date: 08/30/2012  Rate: 87  Rhythm: normal sinus rhythm  QRS Axis: normal  Intervals: normal  ST/T Wave abnormalities: nonspecific T wave changes  Conduction Disutrbances:none  Narrative Interpretation:   Old EKG Reviewed: none available    Ct Head Wo Contrast  08/30/2012   *RADIOLOGY REPORT*  Clinical Data: Headache, the patient states had seizure  CT HEAD WITHOUT CONTRAST  Technique:  Contiguous axial images were obtained from the base of the skull through the vertex without contrast.  Comparison: 05/16/2012  Findings: No skull fracture is noted.  Paranasal sinuses and mastoid air cells are unremarkable.  No intracranial hemorrhage, mass effect or midline shift.  No acute infarction.  No mass lesion is noted on this unenhanced scan.  No hydrocephalus.  The gray and white matter differentiation is preserved.  IMPRESSION: No acute intracranial abnormality.  No significant change.   Original Report Authenticated By: Natasha Mead, M.D.   Results for orders placed during the hospital encounter of 08/30/12  CBC WITH DIFFERENTIAL      Result Value Range   WBC 10.5  4.0 - 10.5 K/uL   RBC 5.25 (*) 3.87 - 5.11 MIL/uL   Hemoglobin 16.2 (*) 12.0 - 15.0 g/dL   HCT 69.6  29.5 - 28.4 %   MCV 87.0  78.0 - 100.0 fL   MCH 30.9  26.0 - 34.0 pg   MCHC 35.4  30.0 - 36.0 g/dL   RDW 13.2  44.0 - 10.2 %   Platelets 161  150 - 400 K/uL   Neutrophils Relative % 63  43 - 77 %   Neutro Abs 6.6  1.7 - 7.7 K/uL   Lymphocytes Relative 31  12 - 46 %   Lymphs Abs 3.3  0.7 - 4.0 K/uL   Monocytes Relative 5  3 - 12 %   Monocytes Absolute 0.5  0.1 - 1.0 K/uL   Eosinophils Relative 1  0 - 5 %   Eosinophils Absolute 0.1  0.0 - 0.7 K/uL   Basophils Relative 0  0 - 1 %   Basophils Absolute 0.0  0.0 - 0.1 K/uL  BASIC METABOLIC PANEL      Result Value Range   Sodium 136  135 - 145 mEq/L   Potassium 3.9  3.5 - 5.1 mEq/L   Chloride 97  96 - 112 mEq/L   CO2 24  19 - 32 mEq/L   Glucose, Bld 98  70 - 99 mg/dL   BUN 12  6 - 23 mg/dL   Creatinine, Ser 2.13  0.50 - 1.10 mg/dL   Calcium 08.6  8.4 - 57.8 mg/dL   GFR calc non Af Amer >90  >90 mL/min   GFR calc Af Amer >90  >90 mL/min       1. Complicated migraine     MDM  Patient's symptoms improve significantly with pain medication. Not able to give a true migraine cocktail due to some unusual allergies. Head CT is negative symptoms consistent with complicated migraine. When the headache improved although a focal findings resolved. Patient had some facial weakness to the right side of her face.  Patient's primary care Dr. followup with.  Shelda Jakes, MD 08/30/12 2221

## 2012-08-30 NOTE — ED Notes (Signed)
This nurse noted that while pt was yelling and screaming at staff, pt was able to freely move lt arm and leg without difficulty, pt also got up and walked without difficulty, pt also had no lt face droop during argument.

## 2012-08-30 NOTE — ED Notes (Signed)
Pt updated on wait status, requesting pain med.

## 2012-08-30 NOTE — ED Notes (Signed)
Discharge instructions reviewed with pt, questions answered. Pt verbalized understanding.  

## 2012-08-30 NOTE — ED Notes (Signed)
Per EMS. Pt was at home with friend and friend reports pt " shaking for about 3 minutes. After she stopped shaking she told me to call EMS because she just had a seizure." pt ambulated to EMS stretcher. A/o x3 upon arrival. nad noted.

## 2012-08-30 NOTE — ED Notes (Signed)
Pt c/o " bad headache" pt moaning out. Left side of mouth appears to be asymmetrical. Dr. Deretha Emory made aware.

## 2012-08-30 NOTE — ED Notes (Signed)
This nurse went in to speak with the patient and give an update, pt was upset and wanted the doctor to give pain medicine or get her out of here. The pt became very defensive and stated she needs to be either given something for pain or be discharged. This nurse explained to the pt that the doctor is seeing other emergent patients and he will come by when he can. She stated she was not important if she wasn't emergent. Pt continued to get loud and verbally abusive to this nurse while trying to explain that her condition has been evaluated and she was not emergent at the moment she has been stabilized and her condition was non-life-threatening. The patient then started yelling and screaming saying this nurse wasn't compassionate and she wasn't important. After arguing with the pt over what was said and not said this nurse asked if she would like to speak with the charge nurse. French Ana RN went to bedside with nurse to clear up any misconceptions and pt was verbally abusive to both staff members at this point. EDP notified and went to the room to speak with pt.

## 2012-08-30 NOTE — ED Notes (Signed)
Pt ambulated self to nursing desk with steady gait, pt states that when the pain "got under control" her movement returned.

## 2012-08-30 NOTE — ED Notes (Signed)
Pt requesting take home medication, states she is ready for discharge, EDP aware.

## 2012-10-21 ENCOUNTER — Emergency Department (HOSPITAL_COMMUNITY)
Admission: EM | Admit: 2012-10-21 | Discharge: 2012-10-23 | Disposition: A | Discharge status: Home / Self Care | Payer: PRIVATE HEALTH INSURANCE | Attending: Emergency Medicine | Admitting: Emergency Medicine

## 2012-10-21 ENCOUNTER — Emergency Department (HOSPITAL_COMMUNITY): Payer: PRIVATE HEALTH INSURANCE

## 2012-10-21 ENCOUNTER — Encounter (HOSPITAL_COMMUNITY): Payer: Self-pay | Admitting: Emergency Medicine

## 2012-10-21 DIAGNOSIS — F3289 Other specified depressive episodes: Secondary | ICD-10-CM | POA: Insufficient documentation

## 2012-10-21 DIAGNOSIS — F39 Unspecified mood [affective] disorder: Secondary | ICD-10-CM

## 2012-10-21 DIAGNOSIS — G40909 Epilepsy, unspecified, not intractable, without status epilepticus: Secondary | ICD-10-CM | POA: Insufficient documentation

## 2012-10-21 DIAGNOSIS — J45909 Unspecified asthma, uncomplicated: Secondary | ICD-10-CM | POA: Insufficient documentation

## 2012-10-21 DIAGNOSIS — F329 Major depressive disorder, single episode, unspecified: Secondary | ICD-10-CM

## 2012-10-21 DIAGNOSIS — F17200 Nicotine dependence, unspecified, uncomplicated: Secondary | ICD-10-CM | POA: Insufficient documentation

## 2012-10-21 DIAGNOSIS — Z8639 Personal history of other endocrine, nutritional and metabolic disease: Secondary | ICD-10-CM | POA: Insufficient documentation

## 2012-10-21 DIAGNOSIS — Z862 Personal history of diseases of the blood and blood-forming organs and certain disorders involving the immune mechanism: Secondary | ICD-10-CM | POA: Insufficient documentation

## 2012-10-21 DIAGNOSIS — F411 Generalized anxiety disorder: Secondary | ICD-10-CM | POA: Insufficient documentation

## 2012-10-21 DIAGNOSIS — G8929 Other chronic pain: Secondary | ICD-10-CM | POA: Insufficient documentation

## 2012-10-21 DIAGNOSIS — Z79899 Other long term (current) drug therapy: Secondary | ICD-10-CM | POA: Insufficient documentation

## 2012-10-21 DIAGNOSIS — Z8701 Personal history of pneumonia (recurrent): Secondary | ICD-10-CM | POA: Insufficient documentation

## 2012-10-21 DIAGNOSIS — F909 Attention-deficit hyperactivity disorder, unspecified type: Secondary | ICD-10-CM | POA: Insufficient documentation

## 2012-10-21 HISTORY — DX: Other chronic pain: G89.29

## 2012-10-21 HISTORY — DX: Dorsalgia, unspecified: M54.9

## 2012-10-21 LAB — CBC WITH DIFFERENTIAL/PLATELET
Eosinophils Relative: 1 % (ref 0–5)
Hemoglobin: 16.2 g/dL — ABNORMAL HIGH (ref 12.0–15.0)
Lymphocytes Relative: 44 % (ref 12–46)
Lymphs Abs: 4.5 10*3/uL — ABNORMAL HIGH (ref 0.7–4.0)
MCV: 86.7 fL (ref 78.0–100.0)
Neutrophils Relative %: 53 % (ref 43–77)
Platelets: 153 10*3/uL (ref 150–400)
RBC: 5.25 MIL/uL — ABNORMAL HIGH (ref 3.87–5.11)
WBC: 10.4 10*3/uL (ref 4.0–10.5)

## 2012-10-21 LAB — RAPID URINE DRUG SCREEN, HOSP PERFORMED
Amphetamines: NOT DETECTED
Barbiturates: NOT DETECTED
Benzodiazepines: POSITIVE — AB
Cocaine: NOT DETECTED
Tetrahydrocannabinol: NOT DETECTED

## 2012-10-21 LAB — BASIC METABOLIC PANEL
CO2: 25 mEq/L (ref 19–32)
Glucose, Bld: 84 mg/dL (ref 70–99)
Potassium: 3.9 mEq/L (ref 3.5–5.1)
Sodium: 141 mEq/L (ref 135–145)

## 2012-10-21 MED ORDER — ZIPRASIDONE MESYLATE 20 MG IM SOLR
10.0000 mg | Freq: Four times a day (QID) | INTRAMUSCULAR | Status: DC | PRN
  Administered 2012-10-21: 10 mg via INTRAMUSCULAR
  Filled 2012-10-21: qty 20

## 2012-10-21 MED ORDER — LORAZEPAM 1 MG PO TABS
2.0000 mg | ORAL_TABLET | Freq: Once | ORAL | Status: AC
  Administered 2012-10-21: 2 mg via ORAL
  Filled 2012-10-21: qty 2

## 2012-10-21 MED ORDER — LORAZEPAM 2 MG/ML IJ SOLN
2.0000 mg | Freq: Once | INTRAMUSCULAR | Status: AC
  Administered 2012-10-21: 2 mg via INTRAMUSCULAR
  Filled 2012-10-21: qty 1

## 2012-10-21 NOTE — ED Provider Notes (Addendum)
CSN: [id number]     Arrival date & time 10/21/12  1515 History     First MD Initiated Contact with Patient 10/21/12 1538     Chief Complaint  Patient presents with  . Suicidal   (Consider location/radiation/quality/duration/timing/severity/associated sxs/prior Treatment) HPI..... level V caveat for urgent need for intervention.   depression and suicidal ideation for approximately one week. Patient has long history of same. She arrived in the emergency department with her therapist. Patient has been admitted to a psychiatric facility in the past. She has known bipolar illness, ADHD, chronic pain.  Past Medical History  Diagnosis Date  . Seizure   . High cholesterol   . ADHD (attention deficit hyperactivity disorder)   . Bipolar disorder   . Asthma   . Pneumonia   . Chronic back pain    Past Surgical History  Procedure Laterality Date  . Abdominal hysterectomy    . Ovary removed  bilateral  . Leg surgery  left leg  . Facial reconstructive      Family History  Problem Relation Age of Onset  . Diabetes Other   . Heart failure Other    History  Substance Use Topics  . Smoking status: Current Some Day Smoker -- 0.50 packs/day for 20 years    Types: Cigarettes  . Smokeless tobacco: Never Used  . Alcohol Use: No     Comment: used to drink "years ago".    OB History   Grav Para Term Preterm Abortions TAB SAB Ect Mult Living   6 3  3 3  3   3      Review of Systems  Unable to perform ROS   Allergies  Aripiprazole; Cephalexin; Haldol; Tegretol; Zofran; and Ketorolac tromethamine  Home Medications   Current Outpatient Rx  Name  Route  Sig  Dispense  Refill  . albuterol (PROAIR HFA) 108 (90 BASE) MCG/ACT inhaler   Inhalation   Inhale 2 puffs into the lungs every 6 (six) hours as needed. Shortness of breath.         . diazepam (VALIUM) 10 MG tablet   Oral   Take 10 mg by mouth every 8 (eight) hours as needed for anxiety.          . gabapentin (NEURONTIN) 800 MG  tablet   Oral   Take 800 mg by mouth 4 (four) times daily.           . pregabalin (LYRICA) 100 MG capsule   Oral   Take 100 mg by mouth 2 (two) times daily.         . risperiDONE (RISPERDAL) 3 MG tablet   Oral   Take 3 mg by mouth 2 (two) times daily.         . Buprenorphine HCl-Naloxone HCl (SUBOXONE) 4-1 MG FILM   Sublingual   Place 1 Film under the tongue 3 (three) times daily.         Marland Kitchen ibuprofen (ADVIL,MOTRIN) 200 MG tablet   Oral   Take 800 mg by mouth every 4 (four) hours as needed. pain          BP 118/77  Pulse 82  Temp(Src) 98.5 F (36.9 C) (Oral)  Resp 22  SpO2 95% Physical Exam  Nursing note and vitals reviewed. Constitutional: She is oriented to person, place, and time.  Restless, anxious  HENT:  Head: Normocephalic and atraumatic.  Eyes: Conjunctivae and EOM are normal. Pupils are equal, round, and reactive to light.  Neck: Normal range  of motion. Neck supple.  Cardiovascular: Normal rate, regular rhythm and normal heart sounds.   Pulmonary/Chest: Effort normal and breath sounds normal.  Abdominal: Soft. Bowel sounds are normal.  Musculoskeletal: Normal range of motion.  Neurological: She is alert and oriented to person, place, and time.  Skin: Skin is warm and dry.  Psychiatric:  Flat affect, depressed    ED Course   Procedures (including critical care time)  Labs Reviewed  CBC WITH DIFFERENTIAL - Abnormal; Notable for the following:    RBC 5.25 (*)    Hemoglobin 16.2 (*)    Lymphs Abs 4.5 (*)    All other components within normal limits  URINE RAPID DRUG SCREEN (HOSP PERFORMED) - Abnormal; Notable for the following:    Benzodiazepines POSITIVE (*)    All other components within normal limits  BASIC METABOLIC PANEL  ETHANOL   No results found. No diagnosis found.  MDM  Patient is anxious, depressed, suicidal.   Will obtain behavioral health consultation.  Commitment papers signed.  Donnetta Hutching, MD 10/21/12 Kristeen Mans  Donnetta Hutching,  MD 10/25/12 [phone]
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
## 2012-10-21 NOTE — ED Notes (Signed)
Telepsych machine in place. Pt to go to radiology when finished.

## 2012-10-21 NOTE — ED Notes (Signed)
Pt resting at this time.
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
## 2012-10-21 NOTE — ED Notes (Signed)
Spoke with [place] Sink from [facility], pt will receive telepsych from Donell Sievert PA at 2030.

## 2012-10-21 NOTE — ED Notes (Signed)
Pt wanded by security. Pt is upset at this time stating she does not want to go to [facility]. Pt also upset because she is going to have to wait for [facility] assessment. Pt medicated with ativan 2mg  po. Pt states, "I'm just going to sit here and be treated like a criminal" Explained to pt that protocols are in place for safety reasons. Pt tearful. Employees from [employer] here with pt.

## 2012-10-21 NOTE — ED Notes (Signed)
Pt eating dinner

## 2012-10-21 NOTE — ED Notes (Signed)
Pt is now under IVC 

## 2012-10-21 NOTE — ED Notes (Signed)
Called [facility] to set-up time.

## 2012-10-21 NOTE — ED Notes (Signed)
Pt states she needs to be committed for suicidal thoughts x 1 week. States plan is to take a lot of pills. Pt anxious in triage with shaking legs. Hearing voices intermittent that tell her to hurt herself. Cooperative at this time. First thing pt stated was " I need to be committed and I dont want to go to [facility]".

## 2012-10-22 DIAGNOSIS — F39 Unspecified mood [affective] disorder: Secondary | ICD-10-CM

## 2012-10-22 LAB — URINALYSIS, ROUTINE W REFLEX MICROSCOPIC
Bilirubin Urine: NEGATIVE
Glucose, UA: NEGATIVE mg/dL
Ketones, ur: NEGATIVE mg/dL
Leukocytes, UA: NEGATIVE
Nitrite: NEGATIVE
Specific Gravity, Urine: 1.02 (ref 1.005–1.030)
pH: 6.5 (ref 5.0–8.0)

## 2012-10-22 MED ORDER — LORAZEPAM 1 MG PO TABS
1.0000 mg | ORAL_TABLET | ORAL | Status: DC | PRN
  Administered 2012-10-22 – 2012-10-23 (×5): 1 mg via ORAL
  Filled 2012-10-22 (×5): qty 1

## 2012-10-22 MED ORDER — GABAPENTIN 800 MG PO TABS
800.0000 mg | ORAL_TABLET | Freq: Four times a day (QID) | ORAL | Status: DC
  Filled 2012-10-22 (×7): qty 1

## 2012-10-22 MED ORDER — GABAPENTIN 400 MG PO CAPS
ORAL_CAPSULE | ORAL | Status: AC
  Administered 2012-10-22: 800 mg via ORAL
  Filled 2012-10-22: qty 2

## 2012-10-22 MED ORDER — GABAPENTIN 400 MG PO CAPS: 800.0000 mg | ORAL_CAPSULE | Freq: Once | ORAL | Status: DC

## 2012-10-22 MED ORDER — PREGABALIN 50 MG PO CAPS: 100.0000 mg | ORAL_CAPSULE | Freq: Two times a day (BID) | ORAL | Status: DC

## 2012-10-22 MED ORDER — RISPERIDONE 1 MG PO TABS
3.0000 mg | ORAL_TABLET | Freq: Two times a day (BID) | ORAL | Status: DC
  Administered 2012-10-22: 1 mg via ORAL
  Administered 2012-10-22 – 2012-10-23 (×3): 3 mg via ORAL
  Filled 2012-10-22 (×3): qty 1
  Filled 2012-10-22: qty 3
  Filled 2012-10-22: qty 1
  Filled 2012-10-22: qty 3

## 2012-10-22 MED ORDER — GABAPENTIN 400 MG PO CAPS
800.0000 mg | ORAL_CAPSULE | Freq: Four times a day (QID) | ORAL | Status: DC
  Administered 2012-10-22 – 2012-10-23 (×6): 800 mg via ORAL
  Filled 2012-10-22 (×5): qty 2

## 2012-10-22 MED ORDER — RISPERIDONE 1 MG PO TABS
ORAL_TABLET | ORAL | Status: AC
  Filled 2012-10-22: qty 3

## 2012-10-22 MED ORDER — ACETAMINOPHEN 325 MG PO TABS
650.0000 mg | ORAL_TABLET | Freq: Four times a day (QID) | ORAL | Status: DC | PRN
  Administered 2012-10-22 – 2012-10-23 (×2): 650 mg via ORAL
  Filled 2012-10-22 (×3): qty 2

## 2012-10-22 MED ORDER — LORAZEPAM 1 MG PO TABS
2.0000 mg | ORAL_TABLET | Freq: Once | ORAL | Status: AC
  Administered 2012-10-22: 2 mg via ORAL
  Filled 2012-10-22: qty 2

## 2012-10-22 MED ORDER — LORAZEPAM 1 MG PO TABS: 1.0000 mg | ORAL_TABLET | ORAL | Status: DC | PRN

## 2012-10-22 MED ORDER — ALBUTEROL SULFATE HFA 108 (90 BASE) MCG/ACT IN AERS: 2.0000 | INHALATION_SPRAY | Freq: Four times a day (QID) | RESPIRATORY_TRACT | Status: DC | PRN

## 2012-10-22 NOTE — ED Notes (Signed)
Pt resting in bed, even rise and fall of chest, in NAD, officer at bedside.

## 2012-10-22 NOTE — ED Provider Notes (Signed)
1:37 AM telepsych has been completed, the recommendations are pending. RN has contacted behavioral health and PA on call agrees to place a note with recommendations.    Sunnie Nielsen, MD 10/22/12 [phone]

## 2012-10-22 NOTE — ED Notes (Signed)
Notified edp of pt's bp and pt's request for pain medication
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
## 2012-10-22 NOTE — ED Notes (Signed)
Spoke with Latasha [name] at [employer].  States that he will give [place] a call back after consulting with the Dr.
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
## 2012-10-22 NOTE — BH Assessment (Addendum)
[facility] Assessment Progress Note  Update:  Received call from [facility] at [facility], gave clinical information.  Per Loraine Leriche at [place], he needs the doctory's name that denied the pt at [facility] as well as a LOCUS score for the pt before he can give an authorization.  His number to call for the authorization number is [phone].  THe doctor that declined the pt referred to OV per [facility] @ 1933 was Dr.  Wendall Stade due to behavioral acuity.  This passed on to oncoming clinician.  Update:  Pt declined [facility] and [facility].  Called [facility] and no beds per [place] @ 1652.  Called [facility] and no beds per [facility].  Therefore, [facility] referral initiated.  Pt's nurse faxed over pt's IVC papers (copy) to this clinician.  Completed [facility] referral form.  Called [facility] for authorization and per [facility] @ [phone], a clinician to call writer back.  Received another call from April @ [facility] @ 1831 and demographic information verified.  She stated a clinician would call back.  Completed phone referral with Erskine Squibb @ 1834 @ [facility].  Referral faxed for review.  Authorization from [facility] will need to be called in once received to attempt to get pt placed on their wait list.  Staff will need to follow up with [facility] to ensure pt placed on [facility] wait list.

## 2012-10-22 NOTE — Progress Notes (Signed)
Wynetta Emery, MHT was requested by Ala Dach) from [facility] to notify [facility] that [facility] has requested a copy of the custody order and a copy of a urinalysis report. Writer spoke with Darl Pikes charge nurse ) at [facility] @ 8:35pm to inform of request and provided fax number to [facility].

## 2012-10-22 NOTE — ED Notes (Signed)
Pt requesting pain medication.  Explained to pt that she had neurontin due, pt states that she needs something stronger.  Pt requesting to see edp.  Notified edp

## 2012-10-22 NOTE — ED Notes (Signed)
Faxed IVC papers at this time to [facility] to [place]. I spoke with Baxter Hire she reviewed papers and said they were ok. Kasean Denherder

## 2012-10-22 NOTE — ED Notes (Signed)
Kristen from [facility] called and request a copy of IVC papers be faxed to [phone]

## 2012-10-23 DIAGNOSIS — F3160 Bipolar disorder, current episode mixed, unspecified: Secondary | ICD-10-CM

## 2012-10-23 MED ORDER — BACITRACIN-NEOMYCIN-POLYMYXIN 400-5-5000 EX OINT
TOPICAL_OINTMENT | Freq: Once | CUTANEOUS | Status: AC
  Administered 2012-10-23: 15:00:00 via TOPICAL
  Filled 2012-10-23: qty 1

## 2012-10-23 MED ORDER — NICOTINE 21 MG/24HR TD PT24
21.0000 mg | MEDICATED_PATCH | Freq: Once | TRANSDERMAL | Status: DC
  Administered 2012-10-23: 21 mg via TRANSDERMAL

## 2012-10-23 MED ORDER — NICOTINE 21 MG/24HR TD PT24
MEDICATED_PATCH | TRANSDERMAL | Status: AC
  Filled 2012-10-23: qty 1

## 2012-10-23 MED ORDER — GABAPENTIN 300 MG PO CAPS
ORAL_CAPSULE | ORAL | Status: AC
  Filled 2012-10-23: qty 2

## 2012-10-23 MED ORDER — IBUPROFEN 800 MG PO TABS
800.0000 mg | ORAL_TABLET | Freq: Once | ORAL | Status: AC
  Administered 2012-10-23: 800 mg via ORAL

## 2012-10-23 MED ORDER — IBUPROFEN 800 MG PO TABS
800.0000 mg | ORAL_TABLET | Freq: Once | ORAL | Status: AC
  Administered 2012-10-23: 800 mg via ORAL
  Filled 2012-10-23: qty 1

## 2012-10-23 MED ORDER — IBUPROFEN 800 MG PO TABS
ORAL_TABLET | ORAL | Status: AC
  Filled 2012-10-23: qty 1

## 2012-10-23 MED ORDER — GABAPENTIN 100 MG PO CAPS
ORAL_CAPSULE | ORAL | Status: AC
  Filled 2012-10-23: qty 2

## 2012-10-23 NOTE — ED Notes (Signed)
[facility] and stated that [facility] was to be placed on waiting list; no beds available.

## 2012-10-23 NOTE — ED Provider Notes (Signed)
Pt wants IVC rescinded, denies threat to harm self or others, denies SI/HI/hallucinations or withdrawal. D/w [facility] who will repeat telepsych eval at 2000 tonight to consider rescind IVC. 3  D/w [facility] who performed repeat telepsych eval and agree EDP at [facility] can rescind IVC and discharge Pt for OutPt f/u with her [facility] resources. 2340  Hurman Horn, MD 10/24/12 1258

## 2012-10-23 NOTE — ED Notes (Signed)
Patient voicing she is upset that she will probably be going to [employer]. States, "I wonder if they will reevaluate me so I might can go home. I don't feel like I want to kill myself anymore."

## 2012-10-23 NOTE — ED Notes (Signed)
Urinalysis results and IVC paperwork faxed to [facility] as requested.

## 2012-10-23 NOTE — ED Notes (Signed)
Spoke with Zollie Beckers, patient's fiance, and he states that she is welcome to come home and he is arranging for a ride at this time.

## 2012-10-23 NOTE — ED Notes (Signed)
[facility] called for status update on patient's placement. Per [facility] admission staff custody papers not faxed-papers faxed.

## 2012-10-23 NOTE — BH Assessment (Signed)
Verne Spurr, PA said she would do telepsych consulted at 2230. Notified Darl Pikes, RN at [facility] of scheduled time.  Harlin Rain Ria Comment, [facility] Triage Specialist

## 2012-10-23 NOTE — BH Assessment (Addendum)
[facility] Assessment Progress Note  Update @ 0905:  Received call back from Hilda Lias and temporary authorization number for 8/23-8/27/14 was given - [id number].  [facility] will call back with permanent number on Monday and this will need to be relayed to [facility].  Called [facility], spoke with the nurse, Okey Regal, gave her authorization number @ [phone] and she stated pt is on their wait list.  TTS staff will need to call daily to ensure pt is on [facility] wait list.  Update @ [phone] - Called [facility] again regarding authorization number.  Denials were given as requested by [facility] as well as LOCUS score to Hilda Lias.  Hilda Lias having technical difficulties and will have to call clinician back with authorization number for [facility]

## 2012-10-23 NOTE — ED Notes (Signed)
Patient given hospital bed from floor.

## 2012-10-23 NOTE — ED Notes (Signed)
Patient speaking with tele-psych at this time.

## 2012-10-23 NOTE — ED Notes (Signed)
Custody papers signed by officer, refaxed. Per [facility] times and dates not matching on custody papers, making them invalid. Per officers new emergency involuntary papers need to be filled out and faxed to magistrates office. Dr Fonnie Jarvis aware-to see patient and reevaluate because patient states she no longer feels suicidal. Patient to have another tele psych per Dr Fonnie Jarvis before new commitment papers filed.
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
## 2012-10-23 NOTE — ED Notes (Signed)
MD informed that patient would need Emergency IVC to remain under police custody MD feels that a emergency IVC is not needed at this time. MD aware that patient can walk out at any time. MD states patient stated she would wait to be seen by telepsych. RN made aware.

## 2012-10-23 NOTE — ED Notes (Signed)
[facility] called stated that custudy papers where not served. Police officers with patient informed. Lieutenant called-coming to hospital.

## 2012-10-23 NOTE — ED Provider Notes (Signed)
37 y.o. Female ivc secondary to si awaiting placement.  States sore on right breast for 10 mnths and was scheduled to follow up with surgeon for biopsy this week.  Right breast with 2 x 2 cm ulcerated lesion , some tenderness, no mass or surrounding cellulitis.  Patient to have abx ointment and dressing placed.  Patient advised to follow up with her surgeon when discharged.   Hilario Quarry, MD 10/23/12 1447

## 2012-10-23 NOTE — ED Notes (Signed)
Patient requesting something for pain from wound on left breast.

## 2012-10-23 NOTE — Consult Note (Signed)
Telepsych Consultation   Reason for Consult:  Evaluate for IVC recension  Referring Physician: Dr. Roderic Ovens is an 37 y.o. female.  Assessment: AXIS I:  Bipolar, mixed AXIS II:  Borderline Personality Dis. and Cluster B Traits AXIS III:   Past Medical History  Diagnosis Date  . Seizure   . High cholesterol   . ADHD (attention deficit hyperactivity disorder)   . Bipolar disorder   . Asthma   . Pneumonia   . Chronic back pain    AXIS IV:  problems related to social environment AXIS V:  41-50 serious symptoms  Plan:  Patient does not meet criteria for psychiatric inpatient admission.  Subjective:   Latasha [name] is a 37 y.o. female patient admitted with suicidal ideation.Marland Kitchen  HPI:  This patient presented to her therapist reporting suicidal ideation with auditory hallucinations after an argument with her ex-husband. Patient presented to the ED, and due to the severity of her symptoms was placed under involuntary commitment. Medication management was initiated, and Risperdal was started. The patient has had a good reduction in symptoms, the ED physician felt that she was stable for discharge and ask for a second evaluation. HPI Elements:   Location:  Emergency room. Quality:  Acute. Severity:  Moderately severe. Timing:  Chronic. Duration:  Several days. Context:  The patient had received a phone call from her soon-to-be ex-husband that resulted in emotional and mental crisis requiring hospitalization..  Past Psychiatric History: Past Medical History  Diagnosis Date  . Seizure   . High cholesterol   . ADHD (attention deficit hyperactivity disorder)   . Bipolar disorder   . Asthma   . Pneumonia   . Chronic back pain     reports that she has been smoking Cigarettes.  She has a 10 pack-year smoking history. She has never used smokeless tobacco. She reports that she uses illicit drugs (Marijuana). She reports that she does not drink alcohol. Family History   Problem Relation Age of Onset  . Diabetes Other   . Heart failure Other          Allergies:   Allergies  Allergen Reactions  . Aripiprazole Other (See Comments)    Jerks, Locks up jaws.   . Cephalexin Nausea And Vomiting  . Haldol [Haloperidol Decanoate] Swelling and Other (See Comments)    Locks up jaws  . Tegretol [Carbamazepine] Other (See Comments)    Blisters, Sores  . Zofran Other (See Comments)    Locks up jaw, restlessness   . Ketorolac Tromethamine Rash    ACT Assessment Complete:  Yes:    Educational Status    Risk to Self: Risk to self Is patient at risk for suicide?: No  Risk to Others:    Abuse:    Prior Inpatient Therapy:    Prior Outpatient Therapy:    Additional Information:                    Objective: Blood pressure 141/81, pulse 88, temperature 98.2 F (36.8 C), temperature source Oral, resp. rate 20, SpO2 97.00%.There is no weight on file to calculate BMI. Results for orders placed during the hospital encounter of 10/21/12 (from the past 72 hour(s))  URINE RAPID DRUG SCREEN (HOSP PERFORMED)     Status: Abnormal   Collection Time    10/21/12  3:30 PM      Result Value Range   Opiates NONE DETECTED  NONE DETECTED   Cocaine NONE DETECTED  NONE DETECTED  Benzodiazepines POSITIVE (*) NONE DETECTED   Amphetamines NONE DETECTED  NONE DETECTED   Tetrahydrocannabinol NONE DETECTED  NONE DETECTED   Barbiturates NONE DETECTED  NONE DETECTED   Comment:            DRUG SCREEN FOR MEDICAL PURPOSES     ONLY.  IF CONFIRMATION IS NEEDED     FOR ANY PURPOSE, NOTIFY LAB     WITHIN 5 DAYS.                LOWEST DETECTABLE LIMITS     FOR URINE DRUG SCREEN     Drug Class       Cutoff (ng/mL)     Amphetamine      1000     Barbiturate      200     Benzodiazepine   200     Tricyclics       300     Opiates          300     Cocaine          300     THC              50  CBC WITH DIFFERENTIAL     Status: Abnormal   Collection Time    10/21/12   3:46 PM      Result Value Range   WBC 10.4  4.0 - 10.5 K/uL   RBC 5.25 (*) 3.87 - 5.11 MIL/uL   Hemoglobin 16.2 (*) 12.0 - 15.0 g/dL   HCT 16.1  09.6 - 04.5 %   MCV 86.7  78.0 - 100.0 fL   MCH 30.9  26.0 - 34.0 pg   MCHC 35.6  30.0 - 36.0 g/dL   RDW 40.9  81.1 - 91.4 %   Platelets 153  150 - 400 K/uL   Neutrophils Relative % 53  43 - 77 %   Neutro Abs 5.5  1.7 - 7.7 K/uL   Lymphocytes Relative 44  12 - 46 %   Lymphs Abs 4.5 (*) 0.7 - 4.0 K/uL   Monocytes Relative 3  3 - 12 %   Monocytes Absolute 0.3  0.1 - 1.0 K/uL   Eosinophils Relative 1  0 - 5 %   Eosinophils Absolute 0.1  0.0 - 0.7 K/uL   Basophils Relative 0  0 - 1 %   Basophils Absolute 0.0  0.0 - 0.1 K/uL  BASIC METABOLIC PANEL     Status: None   Collection Time    10/21/12  3:46 PM      Result Value Range   Sodium 141  135 - 145 mEq/L   Potassium 3.9  3.5 - 5.1 mEq/L   Chloride 105  96 - 112 mEq/L   CO2 25  19 - 32 mEq/L   Glucose, Bld 84  70 - 99 mg/dL   BUN 8  6 - 23 mg/dL   Creatinine, Ser 7.82  0.50 - 1.10 mg/dL   Calcium 95.6  8.4 - 21.3 mg/dL   GFR calc non Af Amer >90  >90 mL/min   GFR calc Af Amer >90  >90 mL/min   Comment: (NOTE)     The eGFR has been calculated using the CKD EPI equation.     This calculation has not been validated in all clinical situations.     eGFR's persistently <90 mL/min signify possible Chronic Kidney     Disease.  ETHANOL     Status: None  Collection Time    10/21/12  3:46 PM      Result Value Range   Alcohol, Ethyl (B) <11  0 - 11 mg/dL   Comment:            LOWEST DETECTABLE LIMIT FOR     SERUM ALCOHOL IS 11 mg/dL     FOR MEDICAL PURPOSES ONLY  URINALYSIS, ROUTINE W REFLEX MICROSCOPIC     Status: None   Collection Time    10/22/12  8:52 PM      Result Value Range   Color, Urine YELLOW  YELLOW   APPearance CLEAR  CLEAR   Specific Gravity, Urine 1.020  1.005 - 1.030   pH 6.5  5.0 - 8.0   Glucose, UA NEGATIVE  NEGATIVE mg/dL   Hgb urine dipstick NEGATIVE  NEGATIVE    Bilirubin Urine NEGATIVE  NEGATIVE   Ketones, ur NEGATIVE  NEGATIVE mg/dL   Protein, ur NEGATIVE  NEGATIVE mg/dL   Urobilinogen, UA 0.2  0.0 - 1.0 mg/dL   Nitrite NEGATIVE  NEGATIVE   Leukocytes, UA NEGATIVE  NEGATIVE   Comment: MICROSCOPIC NOT DONE ON URINES WITH NEGATIVE PROTEIN, BLOOD, LEUKOCYTES, NITRITE, OR GLUCOSE <1000 mg/dL.   Labs are reviewed and are pertinent for UDS is positive for benzos otherwise negative..  Current Facility-Administered Medications  Medication Dose Route Frequency Provider Last Rate Last Dose  . acetaminophen (TYLENOL) tablet 650 mg  650 mg Oral Q6H PRN Joya Gaskins, MD   650 mg at 10/23/12 1452  . albuterol (PROVENTIL HFA;VENTOLIN HFA) 108 (90 BASE) MCG/ACT inhaler 2 puff  2 puff Inhalation Q6H PRN Sunnie Nielsen, MD      . gabapentin (NEURONTIN) capsule 800 mg  800 mg Oral QID Sunnie Nielsen, MD   800 mg at 10/23/12 2000  . LORazepam (ATIVAN) tablet 1 mg  1 mg Oral Q2H PRN Joya Gaskins, MD   1 mg at 10/23/12 1137  . nicotine (NICODERM CQ - dosed in mg/24 hours) 21 mg/24hr patch           . nicotine (NICODERM CQ - dosed in mg/24 hours) patch 21 mg  21 mg Transdermal Once Hurman Horn, MD   21 mg at 10/23/12 1959  . risperiDONE (RISPERDAL) tablet 3 mg  3 mg Oral BID Sunnie Nielsen, MD   3 mg at 10/23/12 0936  . ziprasidone (GEODON) injection 10 mg  10 mg Intramuscular Q6H PRN Donnetta Hutching, MD   10 mg at 10/21/12 2143   Current Outpatient Prescriptions  Medication Sig Dispense Refill  . albuterol (PROAIR HFA) 108 (90 BASE) MCG/ACT inhaler Inhale 2 puffs into the lungs every 6 (six) hours as needed. Shortness of breath.      . diazepam (VALIUM) 10 MG tablet Take 10 mg by mouth every 8 (eight) hours as needed for anxiety.       . gabapentin (NEURONTIN) 800 MG tablet Take 800 mg by mouth 4 (four) times daily.       . pregabalin (LYRICA) 100 MG capsule Take 100 mg by mouth 2 (two) times daily.      . risperiDONE (RISPERDAL) 3 MG tablet Take 3 mg by mouth 2 (two)  times daily.      . Buprenorphine HCl-Naloxone HCl (SUBOXONE) 4-1 MG FILM Place 1 Film under the tongue 3 (three) times daily.      Marland Kitchen ibuprofen (ADVIL,MOTRIN) 200 MG tablet Take 800 mg by mouth every 4 (four) hours as needed. pain  Psychiatric Specialty Exam:     Blood pressure 141/81, pulse 88, temperature 98.2 F (36.8 C), temperature source Oral, resp. rate 20, SpO2 97.00%.There is no weight on file to calculate BMI.  General Appearance: Casual  Eye Contact::  Good  Speech:  Clear and Coherent  Volume:  Normal  Mood:  Euthymic  Affect:  Appropriate  Thought Process:  Goal Directed  Orientation:  Full (Time, Place, and Person)  Thought Content:  WDL  Suicidal Thoughts:  No  Homicidal Thoughts:  No  Memory:  Immediate;   Fair  Judgement:  Intact  Insight:  Present  Psychomotor Activity:  Normal  Concentration:  Fair  Recall:  Fair  Akathisia:  No  Handed:  Right  AIMS (if indicated):     Assets:  Communication Skills Desire for Improvement Resilience Social Support  Sleep:      Treatment Plan Summary: Medication Management and follow up on out patient basis with [facility] [place]. RN will get collateral information from fiance' prior to d/c home.   Disposition:   discharge patient home with plans to followup at [facility] [place]. Chart is presented to Dr. Ulyses Southward who agrees and supports this decision. Dr. Fonnie Jarvis is made aware and the patient will be discharged home [facility] T. Mariem Skolnick RPAC 11:49 PM 10/23/2012

## 2012-10-28 NOTE — Consult Note (Signed)
Agree with assessment and plan Averey Koning A. Jachelle Fluty, M.D. 

## 2012-11-22 ENCOUNTER — Other Ambulatory Visit (HOSPITAL_COMMUNITY): Payer: Self-pay

## 2012-11-22 DIAGNOSIS — J441 Chronic obstructive pulmonary disease with (acute) exacerbation: Secondary | ICD-10-CM

## 2012-11-29 ENCOUNTER — Ambulatory Visit (HOSPITAL_COMMUNITY): Payer: PRIVATE HEALTH INSURANCE

## 2012-12-08 ENCOUNTER — Ambulatory Visit (HOSPITAL_COMMUNITY)
Admission: RE | Admit: 2012-12-08 | Discharge: 2012-12-08 | Disposition: A | Discharge status: Home / Self Care | Payer: PRIVATE HEALTH INSURANCE | Source: Ambulatory Visit | Attending: Pulmonary Disease | Admitting: Pulmonary Disease

## 2012-12-08 ENCOUNTER — Other Ambulatory Visit (HOSPITAL_COMMUNITY): Payer: Self-pay | Admitting: Pulmonary Disease

## 2012-12-08 ENCOUNTER — Emergency Department (HOSPITAL_COMMUNITY)
Admission: EM | Admit: 2012-12-08 | Discharge: 2012-12-08 | Disposition: A | Discharge status: Home / Self Care | Payer: PRIVATE HEALTH INSURANCE | Attending: Emergency Medicine | Admitting: Emergency Medicine

## 2012-12-08 ENCOUNTER — Encounter (HOSPITAL_COMMUNITY): Payer: Self-pay | Admitting: Emergency Medicine

## 2012-12-08 DIAGNOSIS — M549 Dorsalgia, unspecified: Secondary | ICD-10-CM | POA: Insufficient documentation

## 2012-12-08 DIAGNOSIS — R0602 Shortness of breath: Secondary | ICD-10-CM | POA: Insufficient documentation

## 2012-12-08 DIAGNOSIS — Z79899 Other long term (current) drug therapy: Secondary | ICD-10-CM | POA: Insufficient documentation

## 2012-12-08 DIAGNOSIS — J45909 Unspecified asthma, uncomplicated: Secondary | ICD-10-CM | POA: Insufficient documentation

## 2012-12-08 DIAGNOSIS — F17200 Nicotine dependence, unspecified, uncomplicated: Secondary | ICD-10-CM | POA: Insufficient documentation

## 2012-12-08 DIAGNOSIS — Z8639 Personal history of other endocrine, nutritional and metabolic disease: Secondary | ICD-10-CM | POA: Insufficient documentation

## 2012-12-08 DIAGNOSIS — G8929 Other chronic pain: Secondary | ICD-10-CM | POA: Insufficient documentation

## 2012-12-08 DIAGNOSIS — Z862 Personal history of diseases of the blood and blood-forming organs and certain disorders involving the immune mechanism: Secondary | ICD-10-CM | POA: Insufficient documentation

## 2012-12-08 DIAGNOSIS — J4489 Other specified chronic obstructive pulmonary disease: Secondary | ICD-10-CM | POA: Insufficient documentation

## 2012-12-08 DIAGNOSIS — J189 Pneumonia, unspecified organism: Secondary | ICD-10-CM

## 2012-12-08 DIAGNOSIS — L02219 Cutaneous abscess of trunk, unspecified: Secondary | ICD-10-CM | POA: Insufficient documentation

## 2012-12-08 DIAGNOSIS — F319 Bipolar disorder, unspecified: Secondary | ICD-10-CM | POA: Insufficient documentation

## 2012-12-08 DIAGNOSIS — J449 Chronic obstructive pulmonary disease, unspecified: Secondary | ICD-10-CM | POA: Insufficient documentation

## 2012-12-08 DIAGNOSIS — G40909 Epilepsy, unspecified, not intractable, without status epilepticus: Secondary | ICD-10-CM | POA: Insufficient documentation

## 2012-12-08 DIAGNOSIS — Z792 Long term (current) use of antibiotics: Secondary | ICD-10-CM | POA: Insufficient documentation

## 2012-12-08 DIAGNOSIS — L0291 Cutaneous abscess, unspecified: Secondary | ICD-10-CM

## 2012-12-08 DIAGNOSIS — Z8701 Personal history of pneumonia (recurrent): Secondary | ICD-10-CM | POA: Insufficient documentation

## 2012-12-08 MED ORDER — PROMETHAZINE HCL 25 MG PO TABS: 25.0000 mg | ORAL_TABLET | Freq: Four times a day (QID) | ORAL | Status: DC | PRN

## 2012-12-08 MED ORDER — DOXYCYCLINE HYCLATE 100 MG PO TABS
100.0000 mg | ORAL_TABLET | Freq: Once | ORAL | Status: AC
  Administered 2012-12-08: 100 mg via ORAL
  Filled 2012-12-08: qty 1

## 2012-12-08 MED ORDER — LIDOCAINE HCL (PF) 1 % IJ SOLN
INTRAMUSCULAR | Status: AC
  Administered 2012-12-08: 5 mL
  Filled 2012-12-08: qty 5

## 2012-12-08 MED ORDER — PROMETHAZINE HCL 12.5 MG PO TABS
12.5000 mg | ORAL_TABLET | Freq: Once | ORAL | Status: AC
  Administered 2012-12-08: 12.5 mg via ORAL
  Filled 2012-12-08: qty 1

## 2012-12-08 MED ORDER — DOXYCYCLINE HYCLATE 100 MG PO CAPS: 100.0000 mg | ORAL_CAPSULE | Freq: Two times a day (BID) | ORAL | Status: AC

## 2012-12-08 MED ORDER — AMOXICILLIN 500 MG PO CAPS: 500.0000 mg | ORAL_CAPSULE | Freq: Three times a day (TID) | ORAL | Status: DC

## 2012-12-08 MED ORDER — AMOXICILLIN 250 MG PO CAPS
500.0000 mg | ORAL_CAPSULE | Freq: Once | ORAL | Status: AC
  Administered 2012-12-08: 500 mg via ORAL
  Filled 2012-12-08: qty 2

## 2012-12-08 MED ORDER — ALBUTEROL SULFATE (5 MG/ML) 0.5% IN NEBU
2.5000 mg | INHALATION_SOLUTION | Freq: Once | RESPIRATORY_TRACT | Status: AC
  Administered 2012-12-08: 2.5 mg via RESPIRATORY_TRACT

## 2012-12-08 MED ORDER — LIDOCAINE HCL (PF) 1 % IJ SOLN
INTRAMUSCULAR | Status: AC
  Administered 2012-12-08: 16:00:00
  Filled 2012-12-08: qty 5

## 2012-12-08 NOTE — ED Notes (Signed)
Pt has Boil on rt upper thigh, lt axillae

## 2012-12-08 NOTE — ED Provider Notes (Signed)
CSN: [id number]     Arrival date & time 12/08/12  1410 History   First MD Initiated Contact with Patient 12/08/12 1524     Chief Complaint  Patient presents with  . Abscess   (Consider location/radiation/quality/duration/timing/severity/associated sxs/prior Treatment) Patient is a 37 y.o. female presenting with abscess. The history is provided by the patient.  Abscess Location:  Leg Leg abscess location:  R upper leg Abscess quality: induration, painful, redness and warmth   Red streaking: no   Duration:  4 days Progression:  Worsening Pain details:    Quality:  Pressure and throbbing   Severity:  Severe   Duration:  3 days   Timing:  Intermittent   Progression:  Worsening Chronicity:  New Context: not diabetes   Relieved by:  Nothing Exacerbated by: palpation. Ineffective treatments:  Warm water soaks Associated symptoms: no anorexia, no fever and no nausea   Risk factors: prior abscess   Risk factors: no hx of MRSA     Past Medical History  Diagnosis Date  . Seizure   . High cholesterol   . ADHD (attention deficit hyperactivity disorder)   . Bipolar disorder   . Asthma   . Pneumonia   . Chronic back pain    Past Surgical History  Procedure Laterality Date  . Abdominal hysterectomy    . Ovary removed  bilateral  . Leg surgery  left leg  . Facial reconstructive      Family History  Problem Relation Age of Onset  . Diabetes Other   . Heart failure Other    History  Substance Use Topics  . Smoking status: Current Some Day Smoker -- 0.50 packs/day for 20 years    Types: Cigarettes  . Smokeless tobacco: Never Used  . Alcohol Use: No     Comment: used to drink "years ago".    OB History   Grav Para Term Preterm Abortions TAB SAB Ect Mult Living   6 3  3 3  3   3      Review of Systems  Constitutional: Negative for fever and activity change.       All ROS Neg except as noted in HPI  HENT: Negative for nosebleeds.   Eyes: Negative for photophobia and  discharge.  Respiratory: Negative for cough, shortness of breath and wheezing.   Cardiovascular: Negative for chest pain and palpitations.  Gastrointestinal: Negative for nausea, abdominal pain, blood in stool and anorexia.  Genitourinary: Negative for dysuria, frequency and hematuria.  Musculoskeletal: Positive for back pain. Negative for arthralgias and neck pain.  Skin: Negative.   Neurological: Positive for seizures. Negative for dizziness and speech difficulty.  Psychiatric/Behavioral: Negative for hallucinations and confusion.    Allergies  Aripiprazole; Cephalexin; Haldol; Tegretol; Zofran; and Ketorolac tromethamine  Home Medications   Current Outpatient Rx  Name  Route  Sig  Dispense  Refill  . albuterol (PROAIR HFA) 108 (90 BASE) MCG/ACT inhaler   Inhalation   Inhale 2 puffs into the lungs every 6 (six) hours as needed. Shortness of breath.         . Buprenorphine HCl-Naloxone HCl (SUBOXONE) 4-1 MG FILM   Sublingual   Place 1 Film under the tongue 3 (three) times daily.         . diazepam (VALIUM) 10 MG tablet   Oral   Take 10 mg by mouth every 8 (eight) hours as needed for anxiety.          . gabapentin (NEURONTIN) 800 MG tablet  Oral   Take 800 mg by mouth 4 (four) times daily.          Marland Kitchen ibuprofen (ADVIL,MOTRIN) 200 MG tablet   Oral   Take 800 mg by mouth every 4 (four) hours as needed. pain         . risperiDONE (RISPERDAL) 3 MG tablet   Oral   Take 3 mg by mouth 2 (two) times daily.         Marland Kitchen amoxicillin (AMOXIL) 500 MG capsule   Oral   Take 1 capsule (500 mg total) by mouth 3 (three) times daily.   21 capsule   0   . doxycycline (VIBRAMYCIN) 100 MG capsule   Oral   Take 1 capsule (100 mg total) by mouth 2 (two) times daily.   14 capsule   0   . promethazine (PHENERGAN) 25 MG tablet   Oral   Take 1 tablet (25 mg total) by mouth every 6 (six) hours as needed for nausea.   10 tablet   0    BP 123/67  Pulse 86  Temp(Src) 98.5 F  (36.9 C) (Oral)  Resp 18  Ht 5\' 7"  (1.702 m)  Wt 220 lb (99.791 kg)  BMI 34.45 kg/m2  SpO2 97% Physical Exam  Nursing note and vitals reviewed. Constitutional: She is oriented to person, place, and time. She appears well-developed and well-nourished.  Non-toxic appearance.  HENT:  Head: Normocephalic.  Right Ear: Tympanic membrane and external ear normal.  Left Ear: Tympanic membrane and external ear normal.  Eyes: EOM and lids are normal. Pupils are equal, round, and reactive to light.  Neck: Normal range of motion. Neck supple. Carotid bruit is not present.  Cardiovascular: Normal rate, regular rhythm, normal heart sounds, intact distal pulses and normal pulses.   Pulmonary/Chest: Breath sounds normal. No respiratory distress.  Abdominal: Soft. Bowel sounds are normal. There is no tenderness. There is no guarding.  Genitourinary:    There is tenderness on the right labia. There is tenderness around the vagina. No vaginal discharge found.  Chaperone present during exam.  Musculoskeletal: Normal range of motion.  Lymphadenopathy:       Head (right side): No submandibular adenopathy present.       Head (left side): No submandibular adenopathy present.    She has no cervical adenopathy.  Neurological: She is alert and oriented to person, place, and time. She has normal strength. No cranial nerve deficit or sensory deficit.  Skin: Skin is warm and dry.  Psychiatric: She has a normal mood and affect. Her speech is normal.    ED Course  INCISION AND DRAINAGE Date/Time: 12/08/2012 3:05 PM Performed by: Kathie Dike Authorized by: Kathie Dike Consent: Verbal consent obtained. Risks and benefits: risks, benefits and alternatives were discussed Consent given by: patient Patient understanding: patient states understanding of the procedure being performed Patient identity confirmed: arm band Time out: Immediately prior to procedure a "time out" was called to verify the  correct patient, procedure, equipment, support staff and site/side marked as required. Type: abscess Body area: anogenital Location details: perineum Anesthesia: local infiltration Local anesthetic: lidocaine 1% without epinephrine Patient sedated: no Scalpel size: 11 Incision type: single straight Complexity: simple Drainage: purulent Drainage amount: moderate Wound treatment: wound left open Patient tolerance: Patient tolerated the procedure well with no immediate complications.   (including critical care time) Labs Review Labs Reviewed  CULTURE, ROUTINE-ABSCESS   Imaging Review No results found.  MDM   1. Abscess    *  I have reviewed nursing notes, vital signs, and all appropriate lab and imaging results for this patient.**  I and D carried out. Culture sent to the lab. Pt to begin warm tub soaks. Rx for amoxil and doxycycline given to the patient. Pt will use her suboxone at home for pain.  Kathie Dike, PA-C 12/09/12 [phone]

## 2012-12-09 NOTE — ED Provider Notes (Signed)
Medical screening examination/treatment/procedure(s) were performed by non-physician practitioner and as supervising physician I was immediately available for consultation/collaboration.   Laray Anger, DO 12/09/12 1736

## 2012-12-11 ENCOUNTER — Telehealth (HOSPITAL_COMMUNITY): Payer: Self-pay | Admitting: Emergency Medicine

## 2012-12-11 LAB — CULTURE, ROUTINE-ABSCESS

## 2012-12-11 NOTE — Procedures (Signed)
NAMEPAT, ELICKER NO.:  [id number]  MEDICAL RECORD NO.:  [id number]  LOCATION:                                 FACILITY:  PHYSICIAN:  Ladrea Holladay L. Juanetta Gosling, M.D.DATE OF BIRTH:  October 19, 1975  DATE OF PROCEDURE:  12/10/2012 DATE OF DISCHARGE:                           PULMONARY FUNCTION TEST   Reason for pulmonary function testing is COPD. 1. Spirometry shows a mild ventilatory defect with evidence of airflow     obstruction. 2. Lung volumes are normal. 3. DLCO is moderately reduced and corrects somewhat when ventilation     is taken into account. 4. Airway resistance is elevated confirming the presence of airflow     obstruction.  There is no significant bronchodilator improvement. 5. Considering the patient's smoking history, this is consistent with     the clinical diagnosis of COPD.     Steen Bisig L. Juanetta Gosling, M.D.     ELH/MEDQ  D:  12/10/2012  T:  12/11/2012  Job:  [id number]

## 2012-12-11 NOTE — Progress Notes (Signed)
ED Antimicrobial Stewardship Positive Culture Follow Up   Latasha [name] is an 37 y.o. female who presented to [facility] on 12/08/2012 with a chief complaint of  Chief Complaint  Patient presents with  . Abscess    Recent Results (from the past 720 hour(s))  CULTURE, ROUTINE-ABSCESS     Status: None   Collection Time    12/08/12  3:41 PM      Result Value Range Status   Specimen Description ABSCESS RIGHT INNER THIGH   Final   Special Requests NONE   Final   Gram Stain     Final   Value: ABUNDANT WBC PRESENT, PREDOMINANTLY PMN     NO SQUAMOUS EPITHELIAL CELLS SEEN     ABUNDANT GRAM POSITIVE COCCI IN PAIRS AND CHAINS     FEW GRAM NEGATIVE RODS     Performed at [facility]   Culture     Final   Value: MODERATE ESCHERICHIA COLI     MODERATE GROUP B STREP(S.AGALACTIAE)ISOLATED     Note: TESTING AGAINST S. AGALACTIAE NOT ROUTINELY PERFORMED DUE TO PREDICTABILITY OF AMP/PEN/VAN SUSCEPTIBILITY.     Performed at [facility]   Report Status 12/11/2012 FINAL   Final   Organism ID, Bacteria ESCHERICHIA COLI   Final    [x]  Treated with doxycycline and amoxicillin, organism resistant to prescribed antimicrobial   New antibiotic prescription: Stop taking doxycycline and amoxicillin.  Ceftin 500mg  PO BID x 7 days  ED Provider: Arthor Captain, PA-C   Mickeal Skinner 12/11/2012, 2:55 PM Infectious Diseases Pharmacist Phone# [phone]

## 2012-12-11 NOTE — ED Notes (Signed)
Post ED Visit - Positive Culture Follow-up: Successful Patient Follow-Up  Culture assessed and recommendations reviewed by: []  Wes Dulaney, Pharm.D., BCPS [x]  Celedonio Miyamoto, Pharm.D., BCPS []  Georgina Pillion, Pharm.D., BCPS []  [place], [address].D., BCPS, AAHIVP []  Estella Husk, Pharm.D., BCPS, AAHIVP  Positive abscess culture  []  Patient discharged without antimicrobial prescription and treatment is now indicated [x]  Organism is resistant to prescribed ED discharge antimicrobial []  Patient with positive blood cultures  Changes discussed with ED provider: Arthor Captain PA-C New antibiotic prescription: Stop Doxycycline and Amoxicillin. Start Ceftin 500 mg PO BID x 7 days.    Kylie A Holland 12/11/2012, 5:10 PM

## 2012-12-12 ENCOUNTER — Telehealth (HOSPITAL_COMMUNITY): Payer: Self-pay | Admitting: Emergency Medicine

## 2012-12-13 ENCOUNTER — Telehealth (HOSPITAL_COMMUNITY): Payer: Self-pay | Admitting: Emergency Medicine

## 2012-12-13 NOTE — Telephone Encounter (Deleted)
Pt returned call. Informed of lab results and need to stop taking Doxycycline and Amoxicillin.Post ED Visit - Positive Culture Follow-up: Successful Patient Follow-Up  Culture assessed and recommendations reviewed by: []  Wes Kathryne Eriksson, Pharm.D., BCPS [x]  Celedonio Miyamoto, Pharm.D., BCPS []  Georgina Pillion, Pharm.D., BCPS []  [place], [place].D., BCPS, AAHIVP []  Estella Husk, Pharm.D., BCPS, AAHIVP  Positive abcess culture  []  Patient discharged without antimicrobial prescription and treatment is now indicated [x]  Organism is resistant to prescribed ED discharge antimicrobial  []  Patient with positive blood cultures  Changes discussed with ED provider A. Harris New antibiotic prescription Ceftin 500mg  twice daily for 7 days , disp 14 with no refills.  Called to  patient  Pt would like medication called to [facility] in [place] and have it delivered.  [phone]. This medication will be called in at 9am when the pharm. Opens.    Ashley Jacobs 12/13/2012, 8:43 AM    12/13/2012, 8:50 AM

## 2012-12-13 NOTE — Telephone Encounter (Signed)
Post ED Visit - Positive Culture Follow-up: Successful Patient Follow-Up  Culture assessed and recommendations reviewed by: []  Wes Dulaney, Pharm.D., BCPS [x]  Celedonio Miyamoto, [address].D., BCPS []  Georgina Pillion, Pharm.D., BCPS []  [place], [address].D., BCPS, AAHIVP []  Estella Husk, Pharm.D., BCPS, AAHIVP  Positive abcess culture  []  Patient discharged without antimicrobial prescription and treatment is now indicated [x]  Organism is resistant to prescribed ED discharge antimicrobial []  Patient with positive blood cultures  Changes discussed with ED provider:A. Harris New antibiotic prescription Ceftin 500mg  twice daily for 7 days. Disp 14 with no refills Called to [facility] in [place] [phone]  Contacted patient, date Latasha [name] returned call on 12/13/2012 at 0838   Latasha [name] 12/13/2012, 8:52 AM

## 2012-12-14 ENCOUNTER — Ambulatory Visit (HOSPITAL_COMMUNITY)
Admission: RE | Admit: 2012-12-14 | Discharge: 2012-12-14 | Disposition: A | Discharge status: Home / Self Care | Payer: PRIVATE HEALTH INSURANCE | Source: Ambulatory Visit | Attending: Pulmonary Disease | Admitting: Pulmonary Disease

## 2012-12-14 DIAGNOSIS — Z8701 Personal history of pneumonia (recurrent): Secondary | ICD-10-CM | POA: Insufficient documentation

## 2012-12-14 DIAGNOSIS — J189 Pneumonia, unspecified organism: Secondary | ICD-10-CM

## 2012-12-14 DIAGNOSIS — R0602 Shortness of breath: Secondary | ICD-10-CM | POA: Insufficient documentation

## 2012-12-14 DIAGNOSIS — R079 Chest pain, unspecified: Secondary | ICD-10-CM | POA: Insufficient documentation

## 2012-12-14 DIAGNOSIS — R918 Other nonspecific abnormal finding of lung field: Secondary | ICD-10-CM | POA: Insufficient documentation

## 2012-12-14 MED ORDER — SODIUM CHLORIDE 0.9 % IJ SOLN
INTRAMUSCULAR | Status: AC
  Filled 2012-12-14: qty 500

## 2012-12-14 MED ORDER — IOHEXOL 300 MG/ML  SOLN
80.0000 mL | Freq: Once | INTRAMUSCULAR | Status: AC | PRN
  Administered 2012-12-14: 80 mL via INTRAVENOUS

## 2012-12-15 NOTE — Procedures (Signed)
Latasha [name], Latasha [name] NO.:  [id number]  MEDICAL RECORD NO.:  [id number]  LOCATION:                                 FACILITY:  PHYSICIAN:  Anginette Espejo L. Juanetta Gosling, M.D.DATE OF BIRTH:  02-Aug-1975  DATE OF PROCEDURE:  12/14/2012 DATE OF DISCHARGE:                           PULMONARY FUNCTION TEST   Reason for pulmonary function testing is COPD.  1. Spirometry shows a mild ventilatory defect with evidence of airflow     obstruction. 2. Lung volumes are normal. 3. DLCO is moderately reduced and improved somewhat when ventilation     is taken into account. 4. Airway resistance is elevated confirming the presence of airflow     obstruction. 5. There is no significant bronchodilator improvement. 6. This study is consistent with clinical diagnosis of COPD.     Glynis Hunsucker L. Juanetta Gosling, M.D.     ELH/MEDQ  D:  12/14/2012  T:  12/15/2012  Job:  [id number]

## 2013-02-03 ENCOUNTER — Ambulatory Visit (INDEPENDENT_AMBULATORY_CARE_PROVIDER_SITE_OTHER): Payer: PRIVATE HEALTH INSURANCE | Admitting: Orthopedic Surgery

## 2013-02-03 ENCOUNTER — Encounter: Payer: Self-pay | Admitting: Orthopedic Surgery

## 2013-02-03 VITALS — Ht 67.5 in | Wt 217.0 lb

## 2013-02-03 DIAGNOSIS — G894 Chronic pain syndrome: Secondary | ICD-10-CM

## 2013-02-03 DIAGNOSIS — S83259A Bucket-handle tear of lateral meniscus, current injury, unspecified knee, initial encounter: Secondary | ICD-10-CM

## 2013-02-03 DIAGNOSIS — M23322 Other meniscus derangements, posterior horn of medial meniscus, left knee: Secondary | ICD-10-CM

## 2013-02-03 DIAGNOSIS — S83509A Sprain of unspecified cruciate ligament of unspecified knee, initial encounter: Secondary | ICD-10-CM

## 2013-02-03 DIAGNOSIS — M23329 Other meniscus derangements, posterior horn of medial meniscus, unspecified knee: Secondary | ICD-10-CM

## 2013-02-03 DIAGNOSIS — S83512A Sprain of anterior cruciate ligament of left knee, initial encounter: Secondary | ICD-10-CM

## 2013-02-03 DIAGNOSIS — S83519A Sprain of anterior cruciate ligament of unspecified knee, initial encounter: Secondary | ICD-10-CM | POA: Insufficient documentation

## 2013-02-03 DIAGNOSIS — S83251A Bucket-handle tear of lateral meniscus, current injury, right knee, initial encounter: Secondary | ICD-10-CM

## 2013-02-03 NOTE — Patient Instructions (Signed)
Surgery date to be determined
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
## 2013-02-07 ENCOUNTER — Other Ambulatory Visit: Payer: Self-pay | Admitting: *Deleted

## 2013-02-28 ENCOUNTER — Telehealth: Payer: Self-pay | Admitting: Orthopedic Surgery

## 2013-02-28 NOTE — Telephone Encounter (Addendum)
Regarding out-patient surgery scheduled at [facility], 03/11/12, CPT code(s) [phone], [phone]  contacted insurers -  (1)  primary insurance [employer] - per [place] Artist, no pre-authorization required for this code for participating in-network providers; his name and today's date for reference:      02/28/13, 11:01a.m.  (2)  Secondary insurance Medicaid [place] access - per [place], online inquiry, no pre-authorization required.

## 2013-03-01 ENCOUNTER — Encounter (HOSPITAL_COMMUNITY): Payer: Self-pay | Admitting: Pharmacy Technician

## 2013-03-07 ENCOUNTER — Other Ambulatory Visit (HOSPITAL_COMMUNITY): Payer: Self-pay

## 2013-03-07 NOTE — Patient Instructions (Addendum)
Your procedure is scheduled on: 03/11/2013  [facility] at  8:45   AM.  Call this number if you have problems the morning of surgery: [phone]   Remember:   Do not drink or eat food:After Midnight.  :  Take these medicines the morning of surgery with A SIP OF WATER: Sertraline, Risperidone and Gabapentin. You may use your Valium if needed. Use your Albuterol inhaler also.   Do not wear jewelry, make-up or nail polish.  Do not wear lotions, powders, or perfumes.   Do not shave 48 hours prior to surgery. Men may shave face and neck.  Do not bring valuables to the hospital.  Contacts, dentures or bridgework may not be worn into surgery.  Leave suitcase in the car. After surgery it may be brought to your room.  For patients admitted to the hospital, checkout time is 11:00 AM the day of discharge.   Patients discharged the day of surgery will not be allowed to drive home.    Special Instructions: Shower using CHG 2 nights before surgery and the night before surgery.  If you shower the day of surgery use CHG.  Use special wash - you have one bottle of CHG for all showers.  You should use approximately 1/3 of the bottle for each shower.   Please read over the following fact sheets that you were given: Pain Booklet, MRSA Information, Surgical Site Infection Prevention and Care and Recovery After Surgery  Arthroscopic Procedure, Knee, Care After Refer to this sheet in the next few weeks. These discharge instructions provide you with general information on caring for yourself after you leave the hospital. Your health care provider may also give you specific instructions. Your treatment has been planned according to the most current medical practices available, but unavoidable complications sometimes occur. If you have any problems or questions after discharge, please call your health care provider. HOME CARE INSTRUCTIONS   It is normal to be sore for a couple days after surgery. See your  health care provider if this seems to be getting worse rather than better.  Only take over-the-counter or prescription medicines for pain, discomfort, or fever as directed by your health care provider.  Take showers rather than baths, or as directed by your health care provider.  Change bandages (dressings) if necessary or as directed.  You may resume normal diet and activities as directed or allowed.  Avoid lifting and driving until you are directed otherwise.  Make an appointment to see your health care provider for stitches (suture) or staple removal as directed.  You may put ice on the area.  Put the ice in a plastic bag. Place a towel between your skin and the bag.  Leave the ice on for 15 20 minutes, three to four times per day for the first 2 days.  Elevate the knee above the level of your heart to reduce swelling, and avoid dangling the leg.  Do 10-15 ankle pumps (pointing your toes toward you and then away from you) two to three times daily.  If you are given compression stockings to wear after surgery, use them for as long as your surgeon tells you (around 10-14 days).  Avoid smoking and exposure to second-hand smoke. SEEK MEDICAL CARE IF:   You have increased bleeding from your wounds.  You see redness or swelling or you have increasing pain in your wounds.  You have pus coming from your wound.  You have a fever or persistent  symptoms for more than 2 3 days.  You notice a bad smell coming from the wound or dressing.  You have severe pain with any motion of your knee. SEEK IMMEDIATE MEDICAL CARE IF:   You develop a rash.  You have difficulty breathing.  You develop any reaction or side effects to medicines taken.  You develop pain in the calves or back of the knee.  You develop chest pain, shortness of breath, or difficulty breathing.  You develop numbness or tingling in the leg or foot. MAKE SURE YOU:   Understand these instructions.  Will watch  your condition.  Will get help right away if you are not doing well or you get worse. Document Released: 09/06/2004 Document Revised: 10/20/2012 Document Reviewed: 07/15/2012 [facility] Patient Information 2014 [place], [place]. PATIENT INSTRUCTIONS POST-ANESTHESIA  IMMEDIATELY FOLLOWING SURGERY:  Do not drive or operate machinery for the first twenty four hours after surgery.  Do not make any important decisions for twenty four hours after surgery or while taking narcotic pain medications or sedatives.  If you develop intractable nausea and vomiting or a severe headache please notify your doctor immediately.  FOLLOW-UP:  Please make an appointment with your surgeon as instructed. You do not need to follow up with anesthesia unless specifically instructed to do so.  WOUND CARE INSTRUCTIONS (if applicable):  Keep a dry clean dressing on the anesthesia/puncture wound site if there is drainage.  Once the wound has quit draining you may leave it open to air.  Generally you should leave the bandage intact for twenty four hours unless there is drainage.  If the epidural site drains for more than 36-48 hours please call the anesthesia department.  QUESTIONS?:  Please feel free to call your physician or the hospital operator if you have any questions, and they will be happy to assist you.

## 2013-03-08 ENCOUNTER — Encounter (HOSPITAL_COMMUNITY)
Admission: RE | Admit: 2013-03-08 | Discharge: 2013-03-08 | Disposition: A | Discharge status: Home / Self Care | Payer: PRIVATE HEALTH INSURANCE | Source: Ambulatory Visit | Attending: Orthopedic Surgery | Admitting: Orthopedic Surgery

## 2013-03-08 ENCOUNTER — Encounter (HOSPITAL_COMMUNITY): Payer: Self-pay

## 2013-03-08 HISTORY — DX: Unspecified osteoarthritis, unspecified site: M19.90

## 2013-03-08 HISTORY — DX: Pain in unspecified hip: M25.559

## 2013-03-08 HISTORY — DX: Chronic obstructive pulmonary disease, unspecified: J44.9

## 2013-03-08 HISTORY — DX: Cervicalgia: M54.2

## 2013-03-08 HISTORY — DX: Other chronic pain: G89.29

## 2013-03-08 HISTORY — DX: Fibromyalgia: M79.7

## 2013-03-08 HISTORY — DX: Headache: R51

## 2013-03-08 LAB — HEMOGLOBIN AND HEMATOCRIT, BLOOD
HEMATOCRIT: 40.5 % (ref 36.0–46.0)
Hemoglobin: 14.3 g/dL (ref 12.0–15.0)

## 2013-03-08 LAB — BASIC METABOLIC PANEL
BUN: 8 mg/dL (ref 6–23)
CO2: 29 mEq/L (ref 19–32)
Calcium: 9.7 mg/dL (ref 8.4–10.5)
Chloride: 100 mEq/L (ref 96–112)
Creatinine, Ser: 0.62 mg/dL (ref 0.50–1.10)
GFR calc Af Amer: >90 mL/min (ref >90)
Glucose, Bld: 116 mg/dL — ABNORMAL HIGH (ref 70–99)
POTASSIUM: 4.3 meq/L (ref 3.7–5.3)
SODIUM: 142 meq/L (ref 137–147)
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
## 2013-03-11 ENCOUNTER — Encounter (HOSPITAL_COMMUNITY): Payer: PRIVATE HEALTH INSURANCE | Admitting: Anesthesiology

## 2013-03-11 ENCOUNTER — Ambulatory Visit (HOSPITAL_COMMUNITY): Payer: PRIVATE HEALTH INSURANCE | Admitting: Anesthesiology

## 2013-03-11 ENCOUNTER — Encounter (HOSPITAL_COMMUNITY)
Admission: RE | Disposition: A | Discharge status: Home / Self Care | Payer: Self-pay | Source: Ambulatory Visit | Attending: Orthopedic Surgery

## 2013-03-11 ENCOUNTER — Encounter (HOSPITAL_COMMUNITY): Payer: Self-pay | Admitting: *Deleted

## 2013-03-11 ENCOUNTER — Ambulatory Visit (HOSPITAL_COMMUNITY)
Admission: RE | Admit: 2013-03-11 | Discharge: 2013-03-11 | Disposition: A | Discharge status: Home / Self Care | Payer: PRIVATE HEALTH INSURANCE | Source: Ambulatory Visit | Attending: Orthopedic Surgery | Admitting: Orthopedic Surgery

## 2013-03-11 DIAGNOSIS — M23329 Other meniscus derangements, posterior horn of medial meniscus, unspecified knee: Secondary | ICD-10-CM

## 2013-03-11 DIAGNOSIS — J4489 Other specified chronic obstructive pulmonary disease: Secondary | ICD-10-CM | POA: Insufficient documentation

## 2013-03-11 DIAGNOSIS — Z79899 Other long term (current) drug therapy: Secondary | ICD-10-CM | POA: Insufficient documentation

## 2013-03-11 DIAGNOSIS — IMO0002 Reserved for concepts with insufficient information to code with codable children: Secondary | ICD-10-CM | POA: Insufficient documentation

## 2013-03-11 DIAGNOSIS — S83512D Sprain of anterior cruciate ligament of left knee, subsequent encounter: Secondary | ICD-10-CM

## 2013-03-11 DIAGNOSIS — Z5189 Encounter for other specified aftercare: Secondary | ICD-10-CM

## 2013-03-11 DIAGNOSIS — S83252D Bucket-handle tear of lateral meniscus, current injury, left knee, subsequent encounter: Secondary | ICD-10-CM

## 2013-03-11 DIAGNOSIS — J449 Chronic obstructive pulmonary disease, unspecified: Secondary | ICD-10-CM | POA: Insufficient documentation

## 2013-03-11 DIAGNOSIS — S83509A Sprain of unspecified cruciate ligament of unspecified knee, initial encounter: Secondary | ICD-10-CM | POA: Insufficient documentation

## 2013-03-11 DIAGNOSIS — M23322 Other meniscus derangements, posterior horn of medial meniscus, left knee: Secondary | ICD-10-CM

## 2013-03-11 DIAGNOSIS — S83289A Other tear of lateral meniscus, current injury, unspecified knee, initial encounter: Secondary | ICD-10-CM | POA: Insufficient documentation

## 2013-03-11 HISTORY — PX: KNEE ARTHROSCOPY WITH LATERAL MENISECTOMY: SHX6193

## 2013-03-11 SURGERY — ARTHROSCOPY, KNEE, WITH LATERAL MENISCECTOMY
Anesthesia: General | Site: Knee | Laterality: Left

## 2013-03-11 MED ORDER — FENTANYL CITRATE 0.05 MG/ML IJ SOLN
INTRAMUSCULAR | Status: AC
  Filled 2013-03-11: qty 2

## 2013-03-11 MED ORDER — 0.9 % SODIUM CHLORIDE (POUR BTL) OPTIME
TOPICAL | Status: DC | PRN
  Administered 2013-03-11: 1000 mL

## 2013-03-11 MED ORDER — SODIUM CHLORIDE 0.9 % IJ SOLN
INTRAMUSCULAR | Status: AC
  Filled 2013-03-11: qty 10

## 2013-03-11 MED ORDER — VANCOMYCIN HCL IN DEXTROSE 1-5 GM/200ML-% IV SOLN
INTRAVENOUS | Status: AC
  Filled 2013-03-11: qty 200

## 2013-03-11 MED ORDER — METHOCARBAMOL 100 MG/ML IJ SOLN
500.0000 mg | Freq: Once | INTRAVENOUS | Status: AC
  Administered 2013-03-11: 500 mg via INTRAVENOUS
  Filled 2013-03-11: qty 5

## 2013-03-11 MED ORDER — VANCOMYCIN HCL IN DEXTROSE 1-5 GM/200ML-% IV SOLN
1000.0000 mg | INTRAVENOUS | Status: AC
  Administered 2013-03-11 (×2): 1000 mg via INTRAVENOUS

## 2013-03-11 MED ORDER — MIDAZOLAM HCL 2 MG/2ML IJ SOLN
INTRAMUSCULAR | Status: AC
  Filled 2013-03-11: qty 2

## 2013-03-11 MED ORDER — LACTATED RINGERS IV SOLN
INTRAVENOUS | Status: DC
  Administered 2013-03-11: 10:00:00 via INTRAVENOUS

## 2013-03-11 MED ORDER — MIDAZOLAM HCL 2 MG/2ML IJ SOLN
1.0000 mg | INTRAMUSCULAR | Status: DC | PRN
  Administered 2013-03-11 (×2): 2 mg via INTRAVENOUS
  Filled 2013-03-11: qty 2

## 2013-03-11 MED ORDER — LIDOCAINE HCL 1 % IJ SOLN
INTRAMUSCULAR | Status: DC | PRN
  Administered 2013-03-11: 50 mg via INTRADERMAL

## 2013-03-11 MED ORDER — NEOSTIGMINE METHYLSULFATE 1 MG/ML IJ SOLN
INTRAMUSCULAR | Status: DC | PRN
  Administered 2013-03-11: 1 mg via INTRAVENOUS

## 2013-03-11 MED ORDER — BUPIVACAINE-EPINEPHRINE (PF) 0.5% -1:200000 IJ SOLN
INTRAMUSCULAR | Status: DC | PRN
  Administered 2013-03-11: 60 mL

## 2013-03-11 MED ORDER — ROCURONIUM BROMIDE 100 MG/10ML IV SOLN
INTRAVENOUS | Status: DC | PRN
  Administered 2013-03-11: 10 mg via INTRAVENOUS
  Administered 2013-03-11: 40 mg via INTRAVENOUS

## 2013-03-11 MED ORDER — CHLORHEXIDINE GLUCONATE 4 % EX LIQD
60.0000 mL | Freq: Once | CUTANEOUS | Status: DC
Start: 2013-03-11 — End: 2013-03-11

## 2013-03-11 MED ORDER — OXYCODONE-ACETAMINOPHEN 10-325 MG PO TABS: 1.0000 | ORAL_TABLET | ORAL | Status: DC | PRN

## 2013-03-11 MED ORDER — DEXTROSE 5 % IV SOLN
INTRAVENOUS | Status: DC | PRN
  Administered 2013-03-11: 10:00:00 via INTRAVENOUS

## 2013-03-11 MED ORDER — GLYCOPYRROLATE 0.2 MG/ML IJ SOLN
INTRAMUSCULAR | Status: DC | PRN
  Administered 2013-03-11: 0.4 mg via INTRAVENOUS

## 2013-03-11 MED ORDER — PROMETHAZINE HCL 25 MG/ML IJ SOLN
INTRAMUSCULAR | Status: AC
  Filled 2013-03-11: qty 1

## 2013-03-11 MED ORDER — ROCURONIUM BROMIDE 50 MG/5ML IV SOLN
INTRAVENOUS | Status: AC
  Filled 2013-03-11: qty 1

## 2013-03-11 MED ORDER — SUCCINYLCHOLINE CHLORIDE 20 MG/ML IJ SOLN
INTRAMUSCULAR | Status: AC
  Filled 2013-03-11: qty 1

## 2013-03-11 MED ORDER — EPINEPHRINE HCL 1 MG/ML IJ SOLN
INTRAMUSCULAR | Status: AC
  Filled 2013-03-11: qty 3

## 2013-03-11 MED ORDER — OXYCODONE HCL 5 MG PO TABS
ORAL_TABLET | ORAL | Status: AC
  Filled 2013-03-11: qty 1

## 2013-03-11 MED ORDER — BUPIVACAINE-EPINEPHRINE PF 0.5-1:200000 % IJ SOLN
INTRAMUSCULAR | Status: AC
  Filled 2013-03-11: qty 20

## 2013-03-11 MED ORDER — FENTANYL CITRATE 0.05 MG/ML IJ SOLN
INTRAMUSCULAR | Status: DC | PRN
Start: 2013-03-11 — End: 2013-03-11
  Administered 2013-03-11 (×2): 100 ug via INTRAVENOUS

## 2013-03-11 MED ORDER — PROMETHAZINE HCL 25 MG/ML IJ SOLN
12.5000 mg | Freq: Once | INTRAMUSCULAR | Status: AC
  Administered 2013-03-11: 12.5 mg via INTRAVENOUS

## 2013-03-11 MED ORDER — OXYCODONE HCL 5 MG PO TABS
5.0000 mg | ORAL_TABLET | Freq: Once | ORAL | Status: AC
  Administered 2013-03-11: 5 mg via ORAL

## 2013-03-11 MED ORDER — LIDOCAINE HCL (PF) 1 % IJ SOLN
INTRAMUSCULAR | Status: AC
  Filled 2013-03-11: qty 5

## 2013-03-11 MED ORDER — EPINEPHRINE HCL 1 MG/ML IJ SOLN
INTRAMUSCULAR | Status: DC | PRN
  Administered 2013-03-11 (×3): 1 mL

## 2013-03-11 MED ORDER — PROPOFOL 10 MG/ML IV BOLUS
INTRAVENOUS | Status: AC
  Filled 2013-03-11: qty 20

## 2013-03-11 MED ORDER — EPINEPHRINE HCL 1 MG/ML IJ SOLN
INTRAMUSCULAR | Status: AC
  Filled 2013-03-11: qty 2

## 2013-03-11 MED ORDER — PROPOFOL 10 MG/ML IV BOLUS
INTRAVENOUS | Status: DC | PRN
  Administered 2013-03-11: 180 mg via INTRAVENOUS

## 2013-03-11 MED ORDER — FENTANYL CITRATE 0.05 MG/ML IJ SOLN
25.0000 ug | INTRAMUSCULAR | Status: DC | PRN
  Administered 2013-03-11 (×4): 50 ug via INTRAVENOUS

## 2013-03-11 MED ORDER — MIDAZOLAM HCL 5 MG/5ML IJ SOLN
INTRAMUSCULAR | Status: DC | PRN
  Administered 2013-03-11: 2 mg via INTRAVENOUS

## 2013-03-11 MED ORDER — SODIUM CHLORIDE 0.9 % IR SOLN
Status: DC | PRN
  Administered 2013-03-11 (×3): 3000 mL

## 2013-03-11 SURGICAL SUPPLY — 59 items
ARTHROWAND PARAGON T2 (SURGICAL WAND)
BAG HAMPER (MISCELLANEOUS) ×3 IMPLANT
BANDAGE ELASTIC 6 VELCRO NS (GAUZE/BANDAGES/DRESSINGS) ×3 IMPLANT
BLADE AGGRESSIVE PLUS 4.0 (BLADE) ×3 IMPLANT
BLADE SURG SZ11 CARB STEEL (BLADE) ×3 IMPLANT
CHLORAPREP W/TINT 26ML (MISCELLANEOUS) ×4 IMPLANT
COOLER CRYO IC GRAV AND TUBE (ORTHOPEDIC SUPPLIES) ×3 IMPLANT
COVER PROBE W GEL 5X96 (DRAPES) ×3 IMPLANT
CUFF CRYO KNEE LG 20X31 COOLER (ORTHOPEDIC SUPPLIES) IMPLANT
CUFF CRYO KNEE18X23 MED (MISCELLANEOUS) ×2 IMPLANT
CUFF TOURNIQUET SINGLE 34IN LL (TOURNIQUET CUFF) ×2 IMPLANT
CUFF TOURNIQUET SINGLE 44IN (TOURNIQUET CUFF) IMPLANT
CUTTER ANGLED DBL BITE 4.5 (BURR) ×3 IMPLANT
DECANTER SPIKE VIAL GLASS SM (MISCELLANEOUS) ×6 IMPLANT
ELECT REM PT RETURN 9FT ADLT (ELECTROSURGICAL)
ELECTRODE REM PT RTRN 9FT ADLT (ELECTROSURGICAL) IMPLANT
GAUZE SPONGE 4X4 16PLY XRAY LF (GAUZE/BANDAGES/DRESSINGS) ×3 IMPLANT
GAUZE XEROFORM 5X9 LF (GAUZE/BANDAGES/DRESSINGS) ×3 IMPLANT
GLOVE BIOGEL PI IND STRL 7.0 (GLOVE) IMPLANT
GLOVE BIOGEL PI INDICATOR 7.0 (GLOVE) ×2
GLOVE ECLIPSE 6.5 STRL STRAW (GLOVE) ×2 IMPLANT
GLOVE OPTIFIT SS 6.5 STRL BRWN (GLOVE) ×2 IMPLANT
GLOVE SKINSENSE NS SZ8.0 LF (GLOVE) ×2
GLOVE SKINSENSE STRL SZ8.0 LF (GLOVE) ×1 IMPLANT
GLOVE SS N UNI LF 8.5 STRL (GLOVE) ×3 IMPLANT
GOWN STRL REUS W/ TWL XL LVL3 (GOWN DISPOSABLE) IMPLANT
GOWN STRL REUS W/TWL XL LVL3 (GOWN DISPOSABLE) ×5 IMPLANT
HLDR LEG FOAM (MISCELLANEOUS) ×1 IMPLANT
IV NS IRRIG 3000ML ARTHROMATIC (IV SOLUTION) ×8 IMPLANT
KIT BLADEGUARD II DBL (SET/KITS/TRAYS/PACK) ×3 IMPLANT
KIT ROOM TURNOVER AP CYSTO (KITS) ×3 IMPLANT
LEG HOLDER FOAM (MISCELLANEOUS) ×2
MANIFOLD NEPTUNE II (INSTRUMENTS) ×3 IMPLANT
MARKER SKIN DUAL TIP RULER LAB (MISCELLANEOUS) ×3 IMPLANT
NDL HYPO 18GX1.5 BLUNT FILL (NEEDLE) ×1 IMPLANT
NDL HYPO 21X1.5 SAFETY (NEEDLE) ×1 IMPLANT
NDL SPNL 18GX3.5 QUINCKE PK (NEEDLE) ×1 IMPLANT
NEEDLE HYPO 18GX1.5 BLUNT FILL (NEEDLE) ×3 IMPLANT
NEEDLE HYPO 21X1.5 SAFETY (NEEDLE) ×3 IMPLANT
NEEDLE SPNL 18GX3.5 QUINCKE PK (NEEDLE) ×3 IMPLANT
NS IRRIG 1000ML POUR BTL (IV SOLUTION) ×3 IMPLANT
PACK ARTHRO LIMB DRAPE STRL (MISCELLANEOUS) ×3 IMPLANT
PAD ABD 5X9 TENDERSORB (GAUZE/BANDAGES/DRESSINGS) ×3 IMPLANT
PAD ARMBOARD 7.5X6 YLW CONV (MISCELLANEOUS) ×3 IMPLANT
PADDING CAST COTTON 6X4 STRL (CAST SUPPLIES) ×3 IMPLANT
PADDING WEBRIL 6 STERILE (GAUZE/BANDAGES/DRESSINGS) ×2 IMPLANT
SET ARTHROSCOPY INST (INSTRUMENTS) ×3 IMPLANT
SET ARTHROSCOPY PUMP TUBE (IRRIGATION / IRRIGATOR) ×3 IMPLANT
SET BASIN LINEN APH (SET/KITS/TRAYS/PACK) ×3 IMPLANT
SPONGE GAUZE 4X4 12PLY (GAUZE/BANDAGES/DRESSINGS) ×3 IMPLANT
SPONGE GAUZE 4X4 12PLY STER LF (GAUZE/BANDAGES/DRESSINGS) ×2 IMPLANT
SUT ETHILON 3 0 FSL (SUTURE) ×3 IMPLANT
SYR 30ML LL (SYRINGE) ×3 IMPLANT
SYRINGE 10CC LL (SYRINGE) ×3 IMPLANT
WAND 50 DEG COVAC W/CORD (SURGICAL WAND) ×2 IMPLANT
WAND 90 DEG TURBOVAC W/CORD (SURGICAL WAND) IMPLANT
WAND ARTHRO PARAGON T2 (SURGICAL WAND) IMPLANT
WATER STERILE IRR 1000ML POUR (IV SOLUTION) ×3 IMPLANT
YANKAUER SUCT BULB TIP 10FT TU (MISCELLANEOUS) ×9 IMPLANT

## 2013-03-11 NOTE — Interval H&P Note (Signed)
History and Physical Interval Note:  03/11/2013 9:38 AM  Latasha [name]  has presented today for surgery, with the diagnosis of Left knee medial and lateral meniscal tear  The various methods of treatment have been discussed with the patient and family. After consideration of risks, benefits and other options for treatment, the patient has consented to  Procedure(s): KNEE ARTHROSCOPY WITH LATERAL AND MEDIAL MENISECTOMY (Left) as a surgical intervention .  The patient's history has been reviewed, patient examined, no change in status, stable for surgery.  I have reviewed the patient's chart and labs.  Questions were answered to the patient's satisfaction.     Fuller CanadaStanley Harrison

## 2013-03-11 NOTE — Brief Op Note (Signed)
03/11/2013  11:07 AM  PATIENT:  Latasha [name]  38 y.o. female  PRE-OPERATIVE DIAGNOSIS:  Left knee medial and lateral meniscal tear  POST-OPERATIVE DIAGNOSIS:  Left knee medial and lateral meniscal tear, torn anterior cruciate ligament left knee  PROCEDURE:  Procedure(s): KNEE ARTHROSCOPY WITH LATERAL AND MEDIAL MENISECTOMY, DEBRIDEMENT OF ANTERIOR CRUCIATE LIGAMENT LEFT KNEE (Left)  SURGEON:  Surgeon(s) and Role:      * Vickki HearingStanley E Harrison, MD - Primary  PHYSICIAN ASSISTANT:   ASSISTANTS: none   ANESTHESIA:   general  EBL:  Total I/O In: 800 [I.V.:800] Out: -   BLOOD ADMINISTERED:none  DRAINS: none   LOCAL MEDICATIONS USED:  MARCAINE    and Amount: 60 ml  SPECIMEN:  No Specimen  DISPOSITION OF SPECIMEN:  N/A  COUNTS: yes  TOURNIQUET:    DICTATION: .Dragon Dictation  PLAN OF CARE: Discharge to home after PACU  PATIENT DISPOSITION:  PACU - hemodynamically stable.   Delay start of Pharmacological VTE agent (>24hrs) due to surgical blood loss or risk of bleeding: n/a  Surgical Technique:   The patient was identified in the preoperative holding area the left knee was marked as a surgical site and confirmed. Chart review was completed. Patient was taken back to the operating room and given vancomycin secondary to cephalexin allergy. She was placed in a supine position and she had general anesthesia without complication  The operative left leg was placed in the arthroscopic leg holder. The right leg was placed in a padded leg holder. The left lower extremity was prepped and draped sterilely. Timeout was then completed.  A lateral portal was established the scope was placed into the joint. A diagnostic arthroscopy was completed. Notable findings included the following  #1 torn anterior cruciate ligament #2 torn medial meniscus posterior horn #3 torn lateral meniscus anterior horn  We addressed the lateral meniscus first and lateral meniscectomy was performed. The  meniscus was balanced with the shaver to a stable rim confirmed by probe.  We then addressed the medial meniscus. A series of arthroscopic biters were placed to remove the torn meniscal tissue, meniscal fragments were removed with a motorized shaver and the meniscus was balanced to a stable rim using a 50 ArthroCare wand  The knee was irrigated. The portal sites were closed with 3-0 nylon and the joint was injected with 60 cc of Marcaine with epinephrine  A sterile dressing and Cryo/Cuff were applied  The patient was taken to the recovery room after extubation in stable condition

## 2013-03-11 NOTE — Transfer of Care (Signed)
Immediate Anesthesia Transfer of Care Note  Patient: Latasha [name] [name]  Procedure(s) Performed: Procedure(s): KNEE ARTHROSCOPY WITH LATERAL AND MEDIAL MENISECTOMY, DEBRIDEMENT OF ANTERIOR CRUCIATE LIGAMENT LEFT KNEE (Left)  Patient Location: PACU  Anesthesia Type:General  Level of Consciousness: awake and patient cooperative  Airway & Oxygen Therapy: Patient Spontanous Breathing and Patient connected to face mask oxygen  Post-op Assessment: Report given to PACU RN, Post -op Vital signs reviewed and stable and Patient moving all extremities  Post vital signs: Reviewed and stable  Complications: No apparent anesthesia complications

## 2013-03-11 NOTE — Discharge Instructions (Signed)
Arthroscopic Procedure, Knee, Care After °Refer to this sheet in the next few weeks. These discharge instructions provide you with general information on caring for yourself after you leave the hospital. Your health care provider may also give you specific instructions. Your treatment has been planned according to the most current medical practices available, but unavoidable complications sometimes occur. If you have any problems or questions after discharge, please call your health care provider. °HOME CARE INSTRUCTIONS  °· It is normal to be sore for a couple days after surgery. See your health care provider if this seems to be getting worse rather than better. °· Only take over-the-counter or prescription medicines for pain, discomfort, or fever as directed by your health care provider. °· Take showers rather than baths, or as directed by your health care provider. °· Change bandages (dressings) if necessary or as directed. °· You may resume normal diet and activities as directed or allowed. °· Avoid lifting and driving until you are directed otherwise. °· Make an appointment to see your health care provider for stitches (suture) or staple removal as directed. °· You may put ice on the area. °· Put the ice in a plastic bag. Place a towel between your skin and the bag. °· Leave the ice on for 15 20 minutes, three to four times per day for the first 2 days. °· Elevate the knee above the level of your heart to reduce swelling, and avoid dangling the leg. °· Do 10-15 ankle pumps (pointing your toes toward you and then away from you) two to three times daily. °· If you are given compression stockings to wear after surgery, use them for as long as your surgeon tells you (around 10-14 days). °· Avoid smoking and exposure to second-hand smoke. °SEEK MEDICAL CARE IF:  °· You have increased bleeding from your wounds. °· You see redness or swelling or you have increasing pain in your wounds. °· You have pus coming from your  wound. °· You have a fever or persistent symptoms for more than 2 3 days. °· You notice a bad smell coming from the wound or dressing. °· You have severe pain with any motion of your knee. °SEEK IMMEDIATE MEDICAL CARE IF:  °· You develop a rash. °· You have difficulty breathing. °· You develop any reaction or side effects to medicines taken. °· You develop pain in the calves or back of the knee. °· You develop chest pain, shortness of breath, or difficulty breathing. °· You develop numbness or tingling in the leg or foot. °MAKE SURE YOU:  °· Understand these instructions. °· Will watch your condition. °· Will get help right away if you are not doing well or you get worse. °Document Released: 09/06/2004 Document Revised: 10/20/2012 Document Reviewed: 07/15/2012 °[employer]® Patient Information ©2014 [employer]. ° °

## 2013-03-11 NOTE — Anesthesia Postprocedure Evaluation (Signed)
  Anesthesia Post-op Note  Patient: Latasha [name]  Procedure(s) Performed: Procedure(s): KNEE ARTHROSCOPY WITH LATERAL AND MEDIAL MENISECTOMY, DEBRIDEMENT OF ANTERIOR CRUCIATE LIGAMENT LEFT KNEE (Left)  Patient Location: PACU  Anesthesia Type:General  Level of Consciousness: awake, alert , oriented and patient cooperative  Airway and Oxygen Therapy: Patient Spontanous Breathing  Post-op Pain: 3 /10, mild  Post-op Assessment: Post-op Vital signs reviewed, Patient's Cardiovascular Status Stable, Respiratory Function Stable, Patent Airway and Pain level controlled  Post-op Vital Signs: Reviewed and stable  Complications: No apparent anesthesia complications

## 2013-03-11 NOTE — Anesthesia Procedure Notes (Signed)
Procedure Name: Intubation Date/Time: 03/11/2013 10:05 AM Performed by: Despina HiddenIDACAVAGE, Colan Laymon J Pre-anesthesia Checklist: Patient being monitored, Suction available, Emergency Drugs available and Patient identified Patient Re-evaluated:Patient Re-evaluated prior to inductionOxygen Delivery Method: Circle system utilized Preoxygenation: Pre-oxygenation with 100% oxygen Intubation Type: IV induction and Cricoid Pressure applied Ventilation: Mask ventilation without difficulty Laryngoscope Size: Mac and 3 Grade View: Grade I Tube type: Oral Tube size: 7.0 mm Number of attempts: 1 Airway Equipment and Method: Oral airway and Stylet Placement Confirmation: ETT inserted through vocal cords under direct vision,  positive ETCO2 and breath sounds checked- equal and bilateral Secured at: 22 cm Tube secured with: Tape Dental Injury: Teeth and Oropharynx as per pre-operative assessment

## 2013-03-11 NOTE — Interval H&P Note (Signed)
History and Physical Interval Note:  03/11/2013 9:31 AM  Latasha [name]  has presented today for surgery, with the diagnosis of Left knee medial and lateral meniscal tear  The various methods of treatment have been discussed with the patient and family. After consideration of risks, benefits and other options for treatment, the patient has consented to  Procedure(s): KNEE ARTHROSCOPY WITH LATERAL AND MEDIAL MENISECTOMY (Left) as a surgical intervention .  The patient's history has been reviewed, patient examined, no change in status, stable for surgery.  I have reviewed the patient's chart and labs.  Questions were answered to the patient's satisfaction.     Fuller CanadaStanley Harrison

## 2013-03-11 NOTE — Anesthesia Preprocedure Evaluation (Signed)
Anesthesia Evaluation  Patient identified by MRN, date of birth, ID band Patient awake    Reviewed: Allergy & Precautions, H&P , NPO status , Patient's Chart, lab work & pertinent test results  Airway Mallampati: II TM Distance: >3 FB     Dental  (+) Poor Dentition, Partial Lower and Partial Upper   Pulmonary shortness of breath, pneumonia -, resolved, COPD COPD inhaler, former smoker,  breath sounds clear to auscultation        Cardiovascular negative cardio ROS  Rhythm:Regular Rate:Normal     Neuro/Psych  Headaches, Seizures -, Well Controlled,  PSYCHIATRIC DISORDERS (ADHD) Bipolar Disorder    GI/Hepatic GERD-  Controlled,  Endo/Other    Renal/GU Renal disease     Musculoskeletal  (+) Fibromyalgia -, narcotic dependent  Abdominal   Peds  Hematology   Anesthesia Other Findings   Reproductive/Obstetrics                           Anesthesia Physical Anesthesia Plan  ASA: III  Anesthesia Plan: General   Post-op Pain Management:    Induction: Intravenous, Rapid sequence and Cricoid pressure planned  Airway Management Planned: Oral ETT  Additional Equipment:   Intra-op Plan:   Post-operative Plan: Extubation in OR  Informed Consent: I have reviewed the patients History and Physical, chart, labs and discussed the procedure including the risks, benefits and alternatives for the proposed anesthesia with the patient or authorized representative who has indicated his/her understanding and acceptance.     Plan Discussed with:   Anesthesia Plan Comments:         Anesthesia Quick Evaluation

## 2013-03-14 ENCOUNTER — Ambulatory Visit (INDEPENDENT_AMBULATORY_CARE_PROVIDER_SITE_OTHER): Payer: PRIVATE HEALTH INSURANCE | Admitting: Orthopedic Surgery

## 2013-03-14 ENCOUNTER — Encounter (HOSPITAL_COMMUNITY): Payer: Self-pay | Admitting: Orthopedic Surgery

## 2013-03-14 VITALS — BP 108/58 | Ht 67.5 in | Wt 217.0 lb

## 2013-03-14 DIAGNOSIS — Z9889 Other specified postprocedural states: Secondary | ICD-10-CM

## 2013-03-14 NOTE — Progress Notes (Signed)
Patient ID: Latasha [name], female   DOB: 11/28/1975, 38 y.o.   MRN: [id number] Chief Complaint  Patient presents with  . Follow-up    Post op 1 SALK  DOS 03/11/13    The patient has a torn anterior cruciate ligament partial torn medial and lateral meniscus. Notch and anterior cruciate ligament reconstruction candidate. Start therapy. On next visit measure for anterior cruciate ligament brace Her wound looks good her pain is controlled to a pain management contract with Dr. Tollie EthPlummer Followup 4 weeks  [facility] [facility] [place]

## 2013-03-14 NOTE — Patient Instructions (Signed)
[facility]

## 2013-03-14 NOTE — Op Note (Signed)
Author: Vickki HearingStanley E Harrison, MD Service: Orthopedics Author Type: Physician     Filed: 03/11/2013 11:13 AM Note Time: 03/11/2013 11:07 AM Status: Signed    Editor: Vickki HearingStanley E Harrison, MD (Physician)        03/11/2013  11:07 AM  PATIENT:  Latasha [name]  38 y.o. female  PRE-OPERATIVE DIAGNOSIS:  Left knee medial and lateral meniscal tear  POST-OPERATIVE DIAGNOSIS:  Left knee medial and lateral meniscal tear, torn anterior cruciate ligament left knee  PROCEDURE:  Procedure(s): KNEE ARTHROSCOPY WITH LATERAL AND MEDIAL MENISECTOMY, DEBRIDEMENT OF ANTERIOR CRUCIATE LIGAMENT LEFT KNEE (Left)  SURGEON:  Surgeon(s) and Role:      * Vickki HearingStanley E Harrison, MD - Primary  PHYSICIAN ASSISTANT:   ASSISTANTS: none   ANESTHESIA:   general  EBL:  Total I/O In: 800 [I.V.:800] Out: -   BLOOD ADMINISTERED:none  DRAINS: none   LOCAL MEDICATIONS USED:  MARCAINE    and Amount: 60 ml  SPECIMEN:  No Specimen  DISPOSITION OF SPECIMEN:  N/A  COUNTS: yes  TOURNIQUET:    DICTATION: .Dragon Dictation  PLAN OF CARE: Discharge to home after PACU  PATIENT DISPOSITION:  PACU - hemodynamically stable.   Delay start of Pharmacological VTE agent (>24hrs) due to surgical blood loss or risk of bleeding: n/a  Surgical Technique:   The patient was identified in the preoperative holding area the left knee was marked as a surgical site and confirmed. Chart review was completed. Patient was taken back to the operating room and given vancomycin secondary to cephalexin allergy. She was placed in a supine position and she had general anesthesia without complication  The operative left leg was placed in the arthroscopic leg holder. The right leg was placed in a padded leg holder. The left lower extremity was prepped and draped sterilely. Timeout was then completed.  A lateral portal was established the scope was placed into the joint. A diagnostic arthroscopy was completed. Notable findings included the  following  #1 torn anterior cruciate ligament #2 torn medial meniscus posterior horn #3 torn lateral meniscus anterior horn  We addressed the lateral meniscus first and lateral meniscectomy was performed. The meniscus was balanced with the shaver to a stable rim confirmed by probe.  We then addressed the medial meniscus. A series of arthroscopic biters were placed to remove the torn meniscal tissue, meniscal fragments were removed with a motorized shaver and the meniscus was balanced to a stable rim using a 50 ArthroCare wand  The knee was irrigated. The portal sites were closed with 3-0 nylon and the joint was injected with 60 cc of Marcaine with epinephrine  A sterile dressing and Cryo/Cuff were applied  The patient was taken to the recovery room after extubation in stable condition

## 2013-04-05 ENCOUNTER — Ambulatory Visit: Payer: Self-pay | Admitting: Orthopedic Surgery

## 2013-04-07 ENCOUNTER — Ambulatory Visit (INDEPENDENT_AMBULATORY_CARE_PROVIDER_SITE_OTHER): Payer: PRIVATE HEALTH INSURANCE | Admitting: Orthopedic Surgery

## 2013-04-07 VITALS — BP 146/94 | Ht 67.5 in | Wt 217.0 lb

## 2013-04-07 DIAGNOSIS — M23302 Other meniscus derangements, unspecified lateral meniscus, unspecified knee: Secondary | ICD-10-CM

## 2013-04-07 DIAGNOSIS — Z87828 Personal history of other (healed) physical injury and trauma: Secondary | ICD-10-CM

## 2013-04-07 DIAGNOSIS — Z9889 Other specified postprocedural states: Secondary | ICD-10-CM

## 2013-04-07 DIAGNOSIS — Z8739 Personal history of other diseases of the musculoskeletal system and connective tissue: Secondary | ICD-10-CM

## 2013-04-07 NOTE — Progress Notes (Signed)
Chief Complaint  Patient presents with  . Follow-up    Post op 2 SALK DOS 03/11/13    BP 146/94  Ht 5' 7.5" (1.715 m)  Wt 217 lb (98.431 kg)  BMI 33.47 kg/m2  The patient has a torn anterior cruciate ligament partial torn medial and lateral meniscus.   Second postop visit doing well in a hinged knee brace but needs an anterior cruciate ligament brace she's not a surgical candidate because of chronic pain  She reports improvement in her overall function in terms of knee pain and mobility. She has regained full extension.  She will come back in 6 weeks in the meantime we will get her approved for an anterior cruciate ligament brace.

## 2013-04-07 NOTE — Patient Instructions (Addendum)
Latasha [name] will call for purple  acl brace   Measurements 51 cm above patella

## 2013-04-15 ENCOUNTER — Ambulatory Visit (HOSPITAL_COMMUNITY)
Admission: RE | Admit: 2013-04-15 | Discharge: 2013-04-15 | Disposition: A | Discharge status: Home / Self Care | Payer: 59 | Attending: Psychiatry | Admitting: Psychiatry

## 2013-04-15 ENCOUNTER — Encounter (HOSPITAL_COMMUNITY): Payer: Self-pay | Admitting: Licensed Clinical Social Worker

## 2013-04-15 ENCOUNTER — Emergency Department (HOSPITAL_COMMUNITY)
Admission: EM | Admit: 2013-04-15 | Discharge: 2013-04-17 | Disposition: A | Discharge status: Other Acute Inpatient Hospital | Payer: PRIVATE HEALTH INSURANCE | Attending: Emergency Medicine | Admitting: Emergency Medicine

## 2013-04-15 ENCOUNTER — Encounter (HOSPITAL_COMMUNITY): Payer: Self-pay | Admitting: Emergency Medicine

## 2013-04-15 DIAGNOSIS — J449 Chronic obstructive pulmonary disease, unspecified: Secondary | ICD-10-CM | POA: Insufficient documentation

## 2013-04-15 DIAGNOSIS — Z8701 Personal history of pneumonia (recurrent): Secondary | ICD-10-CM | POA: Insufficient documentation

## 2013-04-15 DIAGNOSIS — F1210 Cannabis abuse, uncomplicated: Secondary | ICD-10-CM | POA: Insufficient documentation

## 2013-04-15 DIAGNOSIS — G8929 Other chronic pain: Secondary | ICD-10-CM | POA: Insufficient documentation

## 2013-04-15 DIAGNOSIS — Z79899 Other long term (current) drug therapy: Secondary | ICD-10-CM | POA: Insufficient documentation

## 2013-04-15 DIAGNOSIS — F909 Attention-deficit hyperactivity disorder, unspecified type: Secondary | ICD-10-CM | POA: Insufficient documentation

## 2013-04-15 DIAGNOSIS — F3160 Bipolar disorder, current episode mixed, unspecified: Secondary | ICD-10-CM | POA: Insufficient documentation

## 2013-04-15 DIAGNOSIS — G40909 Epilepsy, unspecified, not intractable, without status epilepticus: Secondary | ICD-10-CM | POA: Insufficient documentation

## 2013-04-15 DIAGNOSIS — Z87891 Personal history of nicotine dependence: Secondary | ICD-10-CM | POA: Insufficient documentation

## 2013-04-15 DIAGNOSIS — J4489 Other specified chronic obstructive pulmonary disease: Secondary | ICD-10-CM | POA: Insufficient documentation

## 2013-04-15 DIAGNOSIS — M129 Arthropathy, unspecified: Secondary | ICD-10-CM | POA: Insufficient documentation

## 2013-04-15 DIAGNOSIS — F1310 Sedative, hypnotic or anxiolytic abuse, uncomplicated: Secondary | ICD-10-CM | POA: Insufficient documentation

## 2013-04-15 LAB — COMPREHENSIVE METABOLIC PANEL
ALT: 15 U/L (ref 0–35)
AST: 16 U/L (ref 0–37)
Albumin: 4.5 g/dL (ref 3.5–5.2)
Alkaline Phosphatase: 95 U/L (ref 39–117)
BILIRUBIN TOTAL: 0.3 mg/dL (ref 0.3–1.2)
BUN: 9 mg/dL (ref 6–23)
CHLORIDE: 100 meq/L (ref 96–112)
CO2: 22 meq/L (ref 19–32)
Calcium: 10 mg/dL (ref 8.4–10.5)
Creatinine, Ser: 0.68 mg/dL (ref 0.50–1.10)
GFR calc Af Amer: >90 mL/min (ref >90)
Glucose, Bld: 97 mg/dL (ref 70–99)
POTASSIUM: 3.7 meq/L (ref 3.7–5.3)
Sodium: 139 mEq/L (ref 137–147)
Total Protein: 8.4 g/dL — ABNORMAL HIGH (ref 6.0–8.3)

## 2013-04-15 LAB — RAPID URINE DRUG SCREEN, HOSP PERFORMED
AMPHETAMINES: NOT DETECTED
BENZODIAZEPINES: POSITIVE — AB
Barbiturates: NOT DETECTED
Cocaine: NOT DETECTED
Opiates: NOT DETECTED
Tetrahydrocannabinol: POSITIVE — AB

## 2013-04-15 LAB — CBC
HCT: 42.4 % (ref 36.0–46.0)
Hemoglobin: 15.1 g/dL — ABNORMAL HIGH (ref 12.0–15.0)
MCH: 30.6 pg (ref 26.0–34.0)
MCHC: 35.6 g/dL (ref 30.0–36.0)
MCV: 85.8 fL (ref 78.0–100.0)
PLATELETS: 204 10*3/uL (ref 150–400)
RBC: 4.94 MIL/uL (ref 3.87–5.11)
RDW: 13.5 % (ref 11.5–15.5)
WBC: 9.9 10*3/uL (ref 4.0–10.5)

## 2013-04-15 LAB — SALICYLATE LEVEL: Salicylate Lvl: 8.3 mg/dL (ref 2.8–20.0)

## 2013-04-15 LAB — ETHANOL: Alcohol, Ethyl (B): <11 mg/dL (ref 0–11)

## 2013-04-15 LAB — ACETAMINOPHEN LEVEL

## 2013-04-15 MED ORDER — BUPRENORPHINE HCL-NALOXONE HCL 8-2 MG SL SUBL
1.0000 | SUBLINGUAL_TABLET | Freq: Three times a day (TID) | SUBLINGUAL | Status: DC
  Filled 2013-04-15: qty 1

## 2013-04-15 MED ORDER — NICOTINE 21 MG/24HR TD PT24
21.0000 mg | MEDICATED_PATCH | Freq: Every day | TRANSDERMAL | Status: DC
Start: 2013-04-16 — End: 2013-04-17
  Administered 2013-04-16 – 2013-04-17 (×2): 21 mg via TRANSDERMAL
  Filled 2013-04-15 (×2): qty 1

## 2013-04-15 MED ORDER — DICLOFENAC SODIUM 1 % TD GEL
2.0000 g | Freq: Four times a day (QID) | TRANSDERMAL | Status: DC | PRN
  Filled 2013-04-15: qty 100

## 2013-04-15 MED ORDER — RISPERIDONE 2 MG PO TABS
3.0000 mg | ORAL_TABLET | Freq: Two times a day (BID) | ORAL | Status: DC
  Administered 2013-04-15 – 2013-04-17 (×4): 3 mg via ORAL
  Filled 2013-04-15 (×8): qty 1

## 2013-04-15 MED ORDER — SERTRALINE HCL 50 MG PO TABS
100.0000 mg | ORAL_TABLET | Freq: Two times a day (BID) | ORAL | Status: DC
  Administered 2013-04-16 – 2013-04-17 (×3): 100 mg via ORAL
  Filled 2013-04-15 (×4): qty 2

## 2013-04-15 MED ORDER — ALBUTEROL SULFATE HFA 108 (90 BASE) MCG/ACT IN AERS: 2.0000 | INHALATION_SPRAY | Freq: Four times a day (QID) | RESPIRATORY_TRACT | Status: DC | PRN

## 2013-04-15 MED ORDER — ZOLPIDEM TARTRATE 5 MG PO TABS
5.0000 mg | ORAL_TABLET | Freq: Every evening | ORAL | Status: DC | PRN
  Administered 2013-04-16 (×2): 5 mg via ORAL
  Filled 2013-04-15 (×2): qty 1

## 2013-04-15 MED ORDER — TIZANIDINE HCL 4 MG PO TABS
4.0000 mg | ORAL_TABLET | Freq: Three times a day (TID) | ORAL | Status: DC | PRN
  Filled 2013-04-15: qty 1

## 2013-04-15 MED ORDER — BUPRENORPHINE HCL 8 MG SL SUBL
8.0000 mg | SUBLINGUAL_TABLET | Freq: Three times a day (TID) | SUBLINGUAL | Status: DC
  Administered 2013-04-15 – 2013-04-17 (×5): 8 mg via SUBLINGUAL
  Filled 2013-04-15 (×5): qty 1

## 2013-04-15 MED ORDER — OLANZAPINE 5 MG PO TABS
15.0000 mg | ORAL_TABLET | Freq: Every day | ORAL | Status: DC
  Administered 2013-04-15 – 2013-04-16 (×2): 15 mg via ORAL
  Filled 2013-04-15 (×4): qty 1

## 2013-04-15 MED ORDER — GABAPENTIN 400 MG PO CAPS
800.0000 mg | ORAL_CAPSULE | Freq: Four times a day (QID) | ORAL | Status: DC
  Administered 2013-04-15 – 2013-04-17 (×6): 800 mg via ORAL
  Filled 2013-04-15 (×9): qty 2

## 2013-04-15 MED ORDER — DIAZEPAM 5 MG PO TABS
10.0000 mg | ORAL_TABLET | Freq: Three times a day (TID) | ORAL | Status: DC
  Administered 2013-04-15 – 2013-04-16 (×4): 10 mg via ORAL
  Filled 2013-04-15 (×4): qty 2
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
## 2013-04-15 NOTE — BH Assessment (Signed)
Clinician consulted with Alberteen SamFran Hobson NP who states Pt meets criteria for inpatient admission. [facility]. [facility]. Contacted Press photographerCharge Nurse Tiffany to inform of [facility]. Once pt is medical cleared TTS will look for alternative placements.   Yaakov Guthrieelilah Stewart, MSW, LCSW Triage Specialist [phone]
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
## 2013-04-16 NOTE — ED Notes (Signed)
Up to the bathroom 

## 2013-04-16 NOTE — ED Notes (Signed)
Psych MD and NP into see 

## 2013-04-16 NOTE — Consult Note (Signed)
[facility] Face-to-Face Psychiatry Consult   Reason for Consult:  MOOD D/O Referring Physician:  EDP LYNNDA WIERSMA is an 38 y.o. female. Total Time spent with patient: 45 minutes  Assessment: AXIS I:  Bipolar, mixed AXIS II:  Deferred AXIS III:   Past Medical History  Diagnosis Date  . High cholesterol   . ADHD (attention deficit hyperactivity disorder)   . Bipolar disorder   . Asthma   . Pneumonia   . Chronic back pain   . COPD (chronic obstructive pulmonary disease)   . Shortness of breath   . Seizure     scar tissue " on Brain". last seizure 11/2012  . Headache(784.0)   . Arthritis   . Fibromyalgia   . Chronic neck pain   . Chronic hip pain    AXIS IV:  other psychosocial or environmental problems, problems related to social environment and problems with primary support group AXIS V:  21-30 behavior considerably influenced by delusions or hallucinations OR serious impairment in judgment, communication OR inability to function in almost all areas  Plan:  Recommend psychiatric Inpatient admission when medically cleared.  Subjective:   JAYMIE MCKIDDY is a 38 y.o. female patient evaluated for anger problem, impulsivity and Suicidal and homicidal ideation.  HPI:  Caucasian female was brought in by his boy friend for evaluation of her mood, suicidal and homicidal ideation towards her mother.  Patient, this am is still endorsing suicide and homicidal thoughts towards his mother.  Patient states she is going through a lot of stress involving her mother and her children.  Patient's 3 children age ranges from 39- 21 live with patient's mother.  Patient is angry because her mother is preventing her from seeing her children.  Patient also has past suicide attempt hx by cutting her wrist.  Patient have been previously admitted at various hospitals including our [facility] in 2010 and [place].  Patiient sees Dr Franchot Mimes at a pain clinic who placed her on Suboxone for knee, back and hip pain.  Patient is on  anti depressant and Psychotic medications from her Primary care doctor.  Patient states she need inpatient treatment to prevent her from acting on her homicidal and  Suicidal thoughts.  Patient was angry and antagonistic during the assessment.  She reports poor sleep and appetite.  Patient describes her mood as depressed and reports feeling hopeless, helpless and worthless.  We have accepted patient for admission and will be looking for bed else where as we are at capacity at this time.  She denies AVH.  We have resumed all of her home medication including her Suboxone which was ordered by Dr Zenia Resides, [place].  HPI Elements:   Location:  MOOD. Quality:  DEPRESSED, ANXIOUS, ANGRY, SUICIDAL AND HOMICIDAL. Severity:  contemplating suicide and homicide. Context:  family discord, unable to see and relationship with children.  Past Psychiatric History: Past Medical History  Diagnosis Date  . High cholesterol   . ADHD (attention deficit hyperactivity disorder)   . Bipolar disorder   . Asthma   . Pneumonia   . Chronic back pain   . COPD (chronic obstructive pulmonary disease)   . Shortness of breath   . Seizure     scar tissue " on Brain". last seizure 11/2012  . Headache(784.0)   . Arthritis   . Fibromyalgia   . Chronic neck pain   . Chronic hip pain     reports that she quit smoking about 3 months ago. Her smoking use included  Cigarettes. She has a 10 pack-year smoking history. She has never used smokeless tobacco. She reports that she uses illicit drugs (Marijuana). She reports that she does not drink alcohol. Family History  Problem Relation Age of Onset  . Diabetes Other   . Heart failure Other            Allergies:   Allergies  Allergen Reactions  . Aripiprazole Other (See Comments)    Jerks, Locks up jaws.   . Cephalexin Nausea And Vomiting  . Haldol [Haloperidol Decanoate] Swelling and Other (See Comments)    Locks up jaws  . Tegretol [Carbamazepine] Other (See Comments)     Blisters, Sores  . Zofran Other (See Comments)    Locks up jaw, restlessness   . Ketorolac Tromethamine Rash    ACT Assessment Complete:  Yes:    Educational Status    Risk to Self: Risk to self Is patient at risk for suicide?: Yes Substance abuse history and/or treatment for substance abuse?: Yes  Risk to Others:    Abuse:    Prior Inpatient Therapy:    Prior Outpatient Therapy:    Additional Information:                    Objective: Blood pressure 112/76, pulse 76, temperature 97.8 F (36.6 C), temperature source Oral, resp. rate 16, height $RemoveBe'5\' 7"'[id number]$  (1.702 m), weight 99.791 kg (220 lb), SpO2 94.00%.Body mass index is 34.45 kg/(m^2). Results for orders placed during the hospital encounter of 04/15/13 (from the past 72 hour(s))  ACETAMINOPHEN LEVEL     Status: None   Collection Time    04/15/13 10:34 PM      Result Value Ref Range   Acetaminophen (Tylenol), Serum <15.0  10 - 30 ug/mL   Comment:            THERAPEUTIC CONCENTRATIONS VARY     SIGNIFICANTLY. A RANGE OF 10-30     ug/mL MAY BE AN EFFECTIVE     CONCENTRATION FOR MANY PATIENTS.     HOWEVER, SOME ARE BEST TREATED     AT CONCENTRATIONS OUTSIDE THIS     RANGE.     ACETAMINOPHEN CONCENTRATIONS     >150 ug/mL AT 4 HOURS AFTER     INGESTION AND >50 ug/mL AT 12     HOURS AFTER INGESTION ARE     OFTEN ASSOCIATED WITH TOXIC     REACTIONS.  CBC     Status: Abnormal   Collection Time    04/15/13 10:34 PM      Result Value Ref Range   WBC 9.9  4.0 - 10.5 K/uL   RBC 4.94  3.87 - 5.11 MIL/uL   Hemoglobin 15.1 (*) 12.0 - 15.0 g/dL   HCT 42.4  36.0 - 46.0 %   MCV 85.8  78.0 - 100.0 fL   MCH 30.6  26.0 - 34.0 pg   MCHC 35.6  30.0 - 36.0 g/dL   RDW 13.5  11.5 - 15.5 %   Platelets 204  150 - 400 K/uL  COMPREHENSIVE METABOLIC PANEL     Status: Abnormal   Collection Time    04/15/13 10:34 PM      Result Value Ref Range   Sodium 139  137 - 147 mEq/L   Potassium 3.7  3.7 - 5.3 mEq/L   Chloride 100  96 - 112  mEq/L   CO2 22  19 - 32 mEq/L   Glucose, Bld 97  70 - 99 mg/dL  BUN 9  6 - 23 mg/dL   Creatinine, Ser 0.68  0.50 - 1.10 mg/dL   Calcium 10.0  8.4 - 10.5 mg/dL   Total Protein 8.4 (*) 6.0 - 8.3 g/dL   Albumin 4.5  3.5 - 5.2 g/dL   AST 16  0 - 37 U/L   ALT 15  0 - 35 U/L   Alkaline Phosphatase 95  39 - 117 U/L   Total Bilirubin 0.3  0.3 - 1.2 mg/dL   GFR calc non Af Amer >90  >90 mL/min   GFR calc Af Amer >90  >90 mL/min   Comment: (NOTE)     The eGFR has been calculated using the CKD EPI equation.     This calculation has not been validated in all clinical situations.     eGFR's persistently <90 mL/min signify possible Chronic Kidney     Disease.  ETHANOL     Status: None   Collection Time    04/15/13 10:34 PM      Result Value Ref Range   Alcohol, Ethyl (B) <11  0 - 11 mg/dL   Comment:            LOWEST DETECTABLE LIMIT FOR     SERUM ALCOHOL IS 11 mg/dL     FOR MEDICAL PURPOSES ONLY  SALICYLATE LEVEL     Status: None   Collection Time    04/15/13 10:34 PM      Result Value Ref Range   Salicylate Lvl 8.3  2.8 - 20.0 mg/dL  URINE RAPID DRUG SCREEN (HOSP PERFORMED)     Status: Abnormal   Collection Time    04/15/13 10:45 PM      Result Value Ref Range   Opiates NONE DETECTED  NONE DETECTED   Cocaine NONE DETECTED  NONE DETECTED   Benzodiazepines POSITIVE (*) NONE DETECTED   Amphetamines NONE DETECTED  NONE DETECTED   Tetrahydrocannabinol POSITIVE (*) NONE DETECTED   Barbiturates NONE DETECTED  NONE DETECTED   Comment:            DRUG SCREEN FOR MEDICAL PURPOSES     ONLY.  IF CONFIRMATION IS NEEDED     FOR ANY PURPOSE, NOTIFY LAB     WITHIN 5 DAYS.                LOWEST DETECTABLE LIMITS     FOR URINE DRUG SCREEN     Drug Class       Cutoff (ng/mL)     Amphetamine      1000     Barbiturate      200     Benzodiazepine   740     Tricyclics       814     Opiates          300     Cocaine          300     THC              50   Labs are reviewed and are pertinent  for Unremarkable labs, UDS is positive for Benzos and Marijuana.  Current Facility-Administered Medications  Medication Dose Route Frequency Provider Last Rate Last Dose  . albuterol (PROVENTIL HFA;VENTOLIN HFA) 108 (90 BASE) MCG/ACT inhaler 2 puff  2 puff Inhalation Q6H PRN Leota Jacobsen, MD      . buprenorphine (SUBUTEX) sublingual tablet 8 mg  8 mg Sublingual TID Leota Jacobsen, MD   8 mg at  04/16/13 1031  . diazepam (VALIUM) tablet 10 mg  10 mg Oral 3 times per day Leota Jacobsen, MD   10 mg at 04/16/13 0549  . diclofenac sodium (VOLTAREN) 1 % transdermal gel 2 g  2 g Topical QID PRN Leota Jacobsen, MD      . gabapentin (NEURONTIN) capsule 800 mg  800 mg Oral QID Leota Jacobsen, MD   800 mg at 04/16/13 1036  . nicotine (NICODERM CQ - dosed in mg/24 hours) patch 21 mg  21 mg Transdermal Daily Leota Jacobsen, MD   21 mg at 04/16/13 1029  . OLANZapine (ZYPREXA) tablet 15 mg  15 mg Oral QHS Leota Jacobsen, MD   15 mg at 04/15/13 2329  . risperiDONE (RISPERDAL) tablet 3 mg  3 mg Oral BID Leota Jacobsen, MD   3 mg at 04/16/13 1030  . sertraline (ZOLOFT) tablet 100 mg  100 mg Oral BID Leota Jacobsen, MD   100 mg at 04/16/13 1030  . tiZANidine (ZANAFLEX) tablet 4 mg  4 mg Oral Q8H PRN Leota Jacobsen, MD      . zolpidem [facility]) tablet 5 mg  5 mg Oral QHS PRN Ephraim Hamburger, MD   5 mg at 04/16/13 0010   Current Outpatient Prescriptions  Medication Sig Dispense Refill  . buprenorphine-naloxone (SUBOXONE) 8-2 MG SUBL SL tablet Place 1 tablet under the tongue 3 (three) times daily.      . diazepam (VALIUM) 10 MG tablet Take 10 mg by mouth every 8 (eight) hours.       . diclofenac sodium (VOLTAREN) 1 % GEL Apply 2 g topically 4 (four) times daily as needed (joint pain).      Marland Kitchen diphenhydramine-acetaminophen (TYLENOL PM) 25-500 MG TABS Take 1 tablet by mouth at bedtime as needed (sleep).      . gabapentin (NEURONTIN) 800 MG tablet Take 800 mg by mouth 4 (four) times daily.       Marland Kitchen ibuprofen  (ADVIL,MOTRIN) 200 MG tablet Take 800 mg by mouth every 4 (four) hours as needed. pain      . OLANZapine (ZYPREXA) 15 MG tablet Take 15 mg by mouth at bedtime.      . risperiDONE (RISPERDAL) 3 MG tablet Take 3 mg by mouth 2 (two) times daily.      . sertraline (ZOLOFT) 100 MG tablet Take 100 mg by mouth 2 (two) times daily.      Marland Kitchen tiZANidine (ZANAFLEX) 4 MG tablet Take 4 mg by mouth every 8 (eight) hours as needed for muscle spasms.      Marland Kitchen albuterol (PROAIR HFA) 108 (90 BASE) MCG/ACT inhaler Inhale 2 puffs into the lungs every 6 (six) hours as needed. Shortness of breath.       Review of Physical examination performed in the ER on 04/15/2013 is unremarkable.  Psychiatric Specialty Exam:     Blood pressure 112/76, pulse 76, temperature 97.8 F (36.6 C), temperature source Oral, resp. rate 16, height $RemoveBe'5\' 7"'gKrjWLNti$  (1.702 m), weight 99.791 kg (220 lb), SpO2 94.00%.Body mass index is 34.45 kg/(m^2).  General Appearance: Casual  Eye Contact::  Fair  Speech:  Clear and Coherent and Pressured  Volume:  Normal  Mood:  Angry, Anxious, Depressed, Hopeless, Irritable and Worthless  Affect:  Congruent, Depressed and Flat  Thought Process:  Coherent and Goal Directed  Orientation:  Full (Time, Place, and Person)  Thought Content:  Suicidal and Homicidal  Suicidal Thoughts:  No  Homicidal Thoughts:  No  Memory:  Immediate;   Good Recent;   Good Remote;   Good  Judgement:  Poor  Insight:  Good  Psychomotor Activity:  Normal  Concentration:  Good  Recall:  NA  Fund of Knowledge:Good  Language: Good  Akathisia:  NA  Handed:  Right  AIMS (if indicated):     Assets:  Desire for Improvement  Sleep:      Musculoskeletal: Strength & Muscle Tone: within normal limits Gait & Station: normal Patient leans: N/A  Treatment Plan Summary:  Consult and face to face interview with Dr Louretta Shorten We have accepted patient for admission We will be seeking placement at other hospitals since we are at  capacity We have resumed all of her home medications. Daily contact with patient to assess and evaluate symptoms and progress in treatment Medication management  Delfin Gant   PMHNP-BC 04/16/2013 1:17 PM  Patient was seen face-to-face for this psychiatric evaluation, suicide risk assessment and case discussed with the physician extender and formulated treatment plan. Reviewed the information documented and agree with the treatment plan.  Wesleigh Markovic,JANARDHAHA R. 04/16/2013 7:45 PM

## 2013-04-16 NOTE — ED Notes (Signed)
Sp[facility] with [facility]ie [facility] to let her know that patient will not be transportation until tomorrow due to IVC status.

## 2013-04-16 NOTE — BH Assessment (Signed)
[facility] stating they are not licensed for Suboxone.  Harlin RainFord Ellis Ria CommentWarrick Jr, LPC, [facility] Triage Specialist

## 2013-04-16 NOTE — Progress Notes (Addendum)
Received phone call from [place] [facility], pt has been accepted by Dr. Gwen Poundsoffin, report number [phone].  Pt will be IVC'd for transport, IVC in process.  Pts RN made aware.  Placed [facility] to let her know that pt will be IVC'd for transport and probably will not arrive until tomorrow am [facility].  Tomi BambergerMariya Dagoberto Nealy Disposition MHT

## 2013-04-16 NOTE — ED Notes (Signed)
Pt transferred from triage, presents with complaint of HI towards family member, no specific plan.  Denies SI or AV hallucinations, feeling hopeless.  Reports hx of Bipolar DO, Borderline Personality DO, ADHD and PTSD.  Pt reports she attempted SI 3 yrs ago by drug overdose.  Pt calm & cooperative at present.

## 2013-04-16 NOTE — BH Assessment (Signed)
Per Rosey BathKelly Southard, [facility], adult unit is at [facility]. Contacted the following facilities for placement:  [facility]- Bed available. Faxed clinical information. [facility]- Bed available. Faxed clinical information. [facility]. Faxed clinical information. [facility]. Faxed clinical information. [facility]. Faxed clinical information. [facility]- Bed available. Faxed clinical information.  [facility]- Left voicemail. No response. [facility]- At capacity   [facility] Patsy BaltimoreWarrick Jr, [facility], [facility] Triage Specialist

## 2013-04-16 NOTE — ED Notes (Signed)
Up to the bathroom to shower an change scrubs 

## 2013-04-17 DIAGNOSIS — F3160 Bipolar disorder, current episode mixed, unspecified: Secondary | ICD-10-CM

## 2013-04-17 NOTE — Consult Note (Signed)
Central Follow UP Psychiatry Consult   Reason for Consult:  MOOD D/O Referring Physician:  EDP Latasha [name] is an 38 y.o. female. Total Time spent with patient: 45 minutes  Assessment: AXIS I:  Bipolar, mixed AXIS II:  Deferred AXIS III:   Past Medical History  Diagnosis Date  . High cholesterol   . ADHD (attention deficit hyperactivity disorder)   . Bipolar disorder   . Asthma   . Pneumonia   . Chronic back pain   . COPD (chronic obstructive pulmonary disease)   . Shortness of breath   . Seizure     scar tissue " on Brain". last seizure 11/2012  . Headache(784.0)   . Arthritis   . Fibromyalgia   . Chronic neck pain   . Chronic hip pain    AXIS IV:  other psychosocial or environmental problems, problems related to social environment and problems with primary support group AXIS V:  21-30 behavior considerably influenced by delusions or hallucinations OR serious impairment in judgment, communication OR inability to function in almost all areas  Plan:  Recommend psychiatric Inpatient admission when medically cleared.  Subjective:   Latasha [name] is a 39 y.o. female patient evaluated for anger problem, impulsivity and Suicidal and homicidal ideation.  KGM:WNUUVOZ sitting on side of bed.  Patient states that she slept well last night.  Patient states "No not suicidal; I'm afraid I'm gonna hurt somebody else.     HPI Elements:   Location:  MOOD. Quality:  DEPRESSED, ANXIOUS, ANGRY, SUICIDAL AND HOMICIDAL. Severity:  contemplating suicide and homicide. Context:  family discord, unable to see and relationship with children.  Past Psychiatric History: Past Medical History  Diagnosis Date  . High cholesterol   . ADHD (attention deficit hyperactivity disorder)   . Bipolar disorder   . Asthma   . Pneumonia   . Chronic back pain   . COPD (chronic obstructive pulmonary disease)   . Shortness of breath   . Seizure     scar tissue " on Brain". last seizure 11/2012  .  Headache(784.0)   . Arthritis   . Fibromyalgia   . Chronic neck pain   . Chronic hip pain     reports that she quit smoking about 3 months ago. Her smoking use included Cigarettes. She has a 10 pack-year smoking history. She has never used smokeless tobacco. She reports that she uses illicit drugs (Marijuana). She reports that she does not drink alcohol. Family History  Problem Relation Age of Onset  . Diabetes Other   . Heart failure Other            Allergies:   Allergies  Allergen Reactions  . Aripiprazole Other (See Comments)    Jerks, Locks up jaws.   . Cephalexin Nausea And Vomiting  . Haldol [Haloperidol Decanoate] Swelling and Other (See Comments)    Locks up jaws  . Tegretol [Carbamazepine] Other (See Comments)    Blisters, Sores  . Zofran Other (See Comments)    Locks up jaw, restlessness   . Ketorolac Tromethamine Rash    ACT Assessment Complete:  Yes:    Educational Status    Risk to Self: Risk to self Is patient at risk for suicide?: Yes Substance abuse history and/or treatment for substance abuse?: Yes  Risk to Others:    Abuse:    Prior Inpatient Therapy:    Prior Outpatient Therapy:    Additional Information:      Objective: Blood pressure 139/91, pulse  67, temperature 97.4 F (36.3 C), temperature source Oral, resp. rate 19, height $RemoveBe'5\' 7"'EgVCyeXgQ$  (1.702 m), weight 99.791 kg (220 lb), SpO2 96.00%.Body mass index is 34.45 kg/(m^2). Results for orders placed during the hospital encounter of 04/15/13 (from the past 72 hour(s))  ACETAMINOPHEN LEVEL     Status: None   Collection Time    04/15/13 10:34 PM      Result Value Ref Range   Acetaminophen (Tylenol), Serum <15.0  10 - 30 ug/mL   Comment:            THERAPEUTIC CONCENTRATIONS VARY     SIGNIFICANTLY. A RANGE OF 10-30     ug/mL MAY BE AN EFFECTIVE     CONCENTRATION FOR MANY PATIENTS.     HOWEVER, SOME ARE BEST TREATED     AT CONCENTRATIONS OUTSIDE THIS     RANGE.     ACETAMINOPHEN CONCENTRATIONS      >150 ug/mL AT 4 HOURS AFTER     INGESTION AND >50 ug/mL AT 12     HOURS AFTER INGESTION ARE     OFTEN ASSOCIATED WITH TOXIC     REACTIONS.  CBC     Status: Abnormal   Collection Time    04/15/13 10:34 PM      Result Value Ref Range   WBC 9.9  4.0 - 10.5 K/uL   RBC 4.94  3.87 - 5.11 MIL/uL   Hemoglobin 15.1 (*) 12.0 - 15.0 g/dL   HCT 42.4  36.0 - 46.0 %   MCV 85.8  78.0 - 100.0 fL   MCH 30.6  26.0 - 34.0 pg   MCHC 35.6  30.0 - 36.0 g/dL   RDW 13.5  11.5 - 15.5 %   Platelets 204  150 - 400 K/uL  COMPREHENSIVE METABOLIC PANEL     Status: Abnormal   Collection Time    04/15/13 10:34 PM      Result Value Ref Range   Sodium 139  137 - 147 mEq/L   Potassium 3.7  3.7 - 5.3 mEq/L   Chloride 100  96 - 112 mEq/L   CO2 22  19 - 32 mEq/L   Glucose, Bld 97  70 - 99 mg/dL   BUN 9  6 - 23 mg/dL   Creatinine, Ser 0.68  0.50 - 1.10 mg/dL   Calcium 10.0  8.4 - 10.5 mg/dL   Total Protein 8.4 (*) 6.0 - 8.3 g/dL   Albumin 4.5  3.5 - 5.2 g/dL   AST 16  0 - 37 U/L   ALT 15  0 - 35 U/L   Alkaline Phosphatase 95  39 - 117 U/L   Total Bilirubin 0.3  0.3 - 1.2 mg/dL   GFR calc non Af Amer >90  >90 mL/min   GFR calc Af Amer >90  >90 mL/min   Comment: (NOTE)     The eGFR has been calculated using the CKD EPI equation.     This calculation has not been validated in all clinical situations.     eGFR's persistently <90 mL/min signify possible Chronic Kidney     Disease.  ETHANOL     Status: None   Collection Time    04/15/13 10:34 PM      Result Value Ref Range   Alcohol, Ethyl (B) <11  0 - 11 mg/dL   Comment:            LOWEST DETECTABLE LIMIT FOR     SERUM ALCOHOL IS 11 mg/dL  FOR MEDICAL PURPOSES ONLY  SALICYLATE LEVEL     Status: None   Collection Time    04/15/13 10:34 PM      Result Value Ref Range   Salicylate Lvl 8.3  2.8 - 20.0 mg/dL  URINE RAPID DRUG SCREEN (HOSP PERFORMED)     Status: Abnormal   Collection Time    04/15/13 10:45 PM      Result Value Ref Range   Opiates NONE  DETECTED  NONE DETECTED   Cocaine NONE DETECTED  NONE DETECTED   Benzodiazepines POSITIVE (*) NONE DETECTED   Amphetamines NONE DETECTED  NONE DETECTED   Tetrahydrocannabinol POSITIVE (*) NONE DETECTED   Barbiturates NONE DETECTED  NONE DETECTED   Comment:            DRUG SCREEN FOR MEDICAL PURPOSES     ONLY.  IF CONFIRMATION IS NEEDED     FOR ANY PURPOSE, NOTIFY LAB     WITHIN 5 DAYS.                LOWEST DETECTABLE LIMITS     FOR URINE DRUG SCREEN     Drug Class       Cutoff (ng/mL)     Amphetamine      1000     Barbiturate      200     Benzodiazepine   272     Tricyclics       536     Opiates          300     Cocaine          300     THC              50   Labs are reviewed and are pertinent for Unremarkable labs, UDS is positive for Benzo's and Marijuana.  Current Facility-Administered Medications  Medication Dose Route Frequency Provider Last Rate Last Dose  . albuterol (PROVENTIL HFA;VENTOLIN HFA) 108 (90 BASE) MCG/ACT inhaler 2 puff  2 puff Inhalation Q6H PRN Leota Jacobsen, MD      . buprenorphine (SUBUTEX) sublingual tablet 8 mg  8 mg Sublingual TID Leota Jacobsen, MD   8 mg at 04/17/13 1004  . diazepam (VALIUM) tablet 10 mg  10 mg Oral 3 times per day Leota Jacobsen, MD   10 mg at 04/16/13 2145  . diclofenac sodium (VOLTAREN) 1 % transdermal gel 2 g  2 g Topical QID PRN Leota Jacobsen, MD      . gabapentin (NEURONTIN) capsule 800 mg  800 mg Oral QID Leota Jacobsen, MD   800 mg at 04/17/13 1004  . nicotine (NICODERM CQ - dosed in mg/24 hours) patch 21 mg  21 mg Transdermal Daily Leota Jacobsen, MD   21 mg at 04/17/13 1003  . OLANZapine (ZYPREXA) tablet 15 mg  15 mg Oral QHS Leota Jacobsen, MD   15 mg at 04/16/13 2145  . risperiDONE (RISPERDAL) tablet 3 mg  3 mg Oral BID Leota Jacobsen, MD   3 mg at 04/17/13 1004  . sertraline (ZOLOFT) tablet 100 mg  100 mg Oral BID Leota Jacobsen, MD   100 mg at 04/17/13 1004  . tiZANidine (ZANAFLEX) tablet 4 mg  4 mg Oral Q8H  PRN Leota Jacobsen, MD      . zolpidem [facility]) tablet 5 mg  5 mg Oral QHS PRN Ephraim Hamburger, MD   5 mg at 04/16/13 2146  Current Outpatient Prescriptions  Medication Sig Dispense Refill  . buprenorphine-naloxone (SUBOXONE) 8-2 MG SUBL SL tablet Place 1 tablet under the tongue 3 (three) times daily.      . diazepam (VALIUM) 10 MG tablet Take 10 mg by mouth every 8 (eight) hours.       . diclofenac sodium (VOLTAREN) 1 % GEL Apply 2 g topically 4 (four) times daily as needed (joint pain).      Marland Kitchen diphenhydramine-acetaminophen (TYLENOL PM) 25-500 MG TABS Take 1 tablet by mouth at bedtime as needed (sleep).      . gabapentin (NEURONTIN) 800 MG tablet Take 800 mg by mouth 4 (four) times daily.       Marland Kitchen ibuprofen (ADVIL,MOTRIN) 200 MG tablet Take 800 mg by mouth every 4 (four) hours as needed. pain      . OLANZapine (ZYPREXA) 15 MG tablet Take 15 mg by mouth at bedtime.      . risperiDONE (RISPERDAL) 3 MG tablet Take 3 mg by mouth 2 (two) times daily.      . sertraline (ZOLOFT) 100 MG tablet Take 100 mg by mouth 2 (two) times daily.      Marland Kitchen tiZANidine (ZANAFLEX) 4 MG tablet Take 4 mg by mouth every 8 (eight) hours as needed for muscle spasms.      Marland Kitchen albuterol (PROAIR HFA) 108 (90 BASE) MCG/ACT inhaler Inhale 2 puffs into the lungs every 6 (six) hours as needed. Shortness of breath.       Review of Physical examination performed in the ER on 04/15/2013 is unremarkable.  Psychiatric Specialty Exam:     Blood pressure 139/91, pulse 67, temperature 97.4 F (36.3 C), temperature source Oral, resp. rate 19, height $RemoveBe'5\' 7"'ChiPRjGQe$  (1.702 m), weight 99.791 kg (220 lb), SpO2 96.00%.Body mass index is 34.45 kg/(m^2).  General Appearance: Casual  Eye Contact::  Fair  Speech:  Clear and Coherent and Pressured  Volume:  Normal  Mood:  Angry, Anxious, Depressed, Hopeless, Irritable and Worthless  Affect:  Congruent, Depressed and Flat  Thought Process:  Coherent and Goal Directed  Orientation:  Full (Time, Place,  and Person)  Thought Content:  Suicidal and Homicidal  Suicidal Thoughts:  No  Homicidal Thoughts:  No  Memory:  Immediate;   Good Recent;   Good Remote;   Good  Judgement:  Poor  Insight:  Good  Psychomotor Activity:  Normal  Concentration:  Good  Recall:  NA  Fund of Knowledge:Good  Language: Good  Akathisia:  NA  Handed:  Right  AIMS (if indicated):     Assets:  Desire for Improvement  Sleep:      Musculoskeletal: Strength & Muscle Tone: within normal limits Gait & Station: normal Patient leans: N/A  Treatment Plan Summary:  Consult and face to face interview with Dr Louretta Shorten We have accepted patient for admission We will be seeking placement at other hospitals since we are at capacity We have resumed all of her home medications. Daily contact with patient to assess and evaluate symptoms and progress in treatment Medication management  Disposition:  Continue with current plan for inpatient treatment.  Patient accepted to [facility].  Monitor for safety and stabilization until transfer  Earleen Newport   FNP-BC 04/17/2013 3:35 PM  Patient was seen face-to-face for psychiatric evaluation, suicide risk assessment, case discussed with physician extender and formulated treatment Plan. Reviewed the information documented and agree with the treatment plan.  Marvion Bastidas,JANARDHAHA R. 04/17/2013 6:50 PM  .

## 2013-04-17 NOTE — ED Notes (Signed)
IVC papers, MAR report, transfer report, EDP/psych assessment note/face sheet and belongings given to sheriff.  Money from security office sent.

## 2013-04-17 NOTE — ED Provider Notes (Signed)
[facility]  Latasha [name] PayorPickering, MD 04/17/13 [phone]
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
## 2013-04-17 NOTE — ED Notes (Signed)
Sandwich/soda given prior to transport

## 2013-04-17 NOTE — ED Notes (Signed)
Sheriff will be here approx 1300 to transport, pt is aware.  Approx 2x2 cm area rt inner breast cleaned w/ sterile NS.  Small amt of brown/blood drainage noted on removed dressing.  Dry dressing applied.  Pt reports that the area has been evaluated, and biopsied, and she has been referred to follow up w/ a dermatologist in March.  Pt reports that she had a similar area on the inner lt breast that did heal.

## 2013-04-17 NOTE — ED Notes (Signed)
Message left for sheriff to transport 

## 2013-04-17 NOTE — ED Notes (Signed)
Pt ambulatory w/ sheriff to [facility].

## 2013-05-19 ENCOUNTER — Ambulatory Visit: Payer: Self-pay | Admitting: Orthopedic Surgery

## 2013-05-23 ENCOUNTER — Ambulatory Visit: Payer: Self-pay | Admitting: Orthopedic Surgery

## 2013-05-26 ENCOUNTER — Ambulatory Visit (INDEPENDENT_AMBULATORY_CARE_PROVIDER_SITE_OTHER): Payer: PRIVATE HEALTH INSURANCE | Admitting: Orthopedic Surgery

## 2013-05-26 VITALS — BP 123/76 | Ht 67.0 in | Wt 220.0 lb

## 2013-05-26 DIAGNOSIS — Z9889 Other specified postprocedural states: Secondary | ICD-10-CM

## 2013-05-26 DIAGNOSIS — Z87828 Personal history of other (healed) physical injury and trauma: Secondary | ICD-10-CM | POA: Insufficient documentation

## 2013-05-26 DIAGNOSIS — Z8739 Personal history of other diseases of the musculoskeletal system and connective tissue: Secondary | ICD-10-CM

## 2013-05-26 NOTE — Progress Notes (Signed)
Patient ID: Latasha [name], female   DOB: 10/05/1975, 38 y.o.   MRN: [id number] Chief Complaint  Patient presents with  . Follow-up    6 week recheck left knee DOS 03/11/13    Six-week followup 3 month recheck status post arthroscopic surgery left knee. Exam under anesthesia. She is in an anterior cruciate ligament brace ray still deficient knee. Thought to not be a surgical candidate because of chronic pain and previous drug abuse history  She is doing well in an anterior cruciate ligament brace  trace amount of laxity in her Lockman test. She has regained her range of motion including extension.  Recommend brace for activity continue knee strengthening exercises follow up in 6 months                 Author: Vickki HearingStanley E Hamna Asa, MD Service: Orthopedics Author Type: Physician     Filed: 03/11/2013 11:13 AM Note Time: 03/11/2013 11:07 AM Status: Signed    Editor: Vickki HearingStanley E Tallis Soledad, MD (Physician)        03/11/2013  11:07 AM  PATIENT:  Latasha [name]  38 y.o. female  PRE-OPERATIVE DIAGNOSIS:  Left knee medial and lateral meniscal tear  POST-OPERATIVE DIAGNOSIS:  Left knee medial and lateral meniscal tear, torn anterior cruciate ligament left knee  PROCEDURE:  Procedure(s): KNEE ARTHROSCOPY WITH LATERAL AND MEDIAL MENISECTOMY, DEBRIDEMENT OF ANTERIOR CRUCIATE LIGAMENT LEFT KNEE (Left)  L

## 2013-05-26 NOTE — Patient Instructions (Signed)
Brace for activity   Exercises for the knee

## 2013-09-07 ENCOUNTER — Encounter: Payer: Self-pay | Admitting: Diagnostic Neuroimaging

## 2013-09-07 ENCOUNTER — Ambulatory Visit (INDEPENDENT_AMBULATORY_CARE_PROVIDER_SITE_OTHER): Payer: Medicare Other | Admitting: Diagnostic Neuroimaging

## 2013-09-07 VITALS — BP 116/78 | HR 91 | Temp 98.5°F | Ht 67.0 in | Wt 216.0 lb

## 2013-09-07 DIAGNOSIS — M5442 Lumbago with sciatica, left side: Secondary | ICD-10-CM

## 2013-09-07 DIAGNOSIS — M5430 Sciatica, unspecified side: Secondary | ICD-10-CM

## 2013-09-07 NOTE — Patient Instructions (Signed)
Please ask your PCP to refer you to pain management clinic.
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
## 2013-11-29 ENCOUNTER — Ambulatory Visit: Payer: PRIVATE HEALTH INSURANCE | Admitting: Orthopedic Surgery

## 2014-01-02 ENCOUNTER — Encounter: Payer: Self-pay | Admitting: Diagnostic Neuroimaging

## 2014-04-25 ENCOUNTER — Other Ambulatory Visit: Payer: Self-pay | Admitting: *Deleted

## 2014-04-25 ENCOUNTER — Ambulatory Visit (INDEPENDENT_AMBULATORY_CARE_PROVIDER_SITE_OTHER): Payer: 59 | Admitting: Neurology

## 2014-04-25 ENCOUNTER — Encounter: Payer: Self-pay | Admitting: Neurology

## 2014-04-25 VITALS — BP 132/82 | HR 88 | Ht 67.0 in | Wt 219.0 lb

## 2014-04-25 DIAGNOSIS — F32A Depression, unspecified: Secondary | ICD-10-CM | POA: Insufficient documentation

## 2014-04-25 DIAGNOSIS — M549 Dorsalgia, unspecified: Secondary | ICD-10-CM

## 2014-04-25 DIAGNOSIS — M5442 Lumbago with sciatica, left side: Secondary | ICD-10-CM | POA: Diagnosis not present

## 2014-04-25 DIAGNOSIS — F329 Major depressive disorder, single episode, unspecified: Secondary | ICD-10-CM | POA: Diagnosis not present

## 2014-04-25 DIAGNOSIS — G4089 Other seizures: Secondary | ICD-10-CM

## 2014-04-25 DIAGNOSIS — G5603 Carpal tunnel syndrome, bilateral upper limbs: Secondary | ICD-10-CM

## 2014-04-25 DIAGNOSIS — G8929 Other chronic pain: Secondary | ICD-10-CM

## 2014-04-25 DIAGNOSIS — F445 Conversion disorder with seizures or convulsions: Secondary | ICD-10-CM | POA: Insufficient documentation
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
## 2014-04-25 NOTE — Addendum Note (Signed)
Addended by: Levert FeinsteinYAN, Arleta Ostrum on: 04/25/2014 10:23 AM   Modules accepted: Orders

## 2015-04-13 MED FILL — SUBOXONE 8 MG-2 MG SL FILM: 8-2 | 7 days supply | Qty: 14 | Fill #0

## 2015-07-16 MED FILL — PROMETHAZINE 25 MG TABLET: 25 | 30 days supply | Qty: 90 | Fill #0

## 2015-07-16 MED FILL — SUBOXONE 8 MG-2 MG SL FILM: 8-2 | 28 days supply | Qty: 84 | Fill #0

## 2016-01-18 MED FILL — SUBOXONE 8 MG-2 MG SL FILM: 8-2 | 14 days supply | Qty: 42 | Fill #0

## 2016-01-31 MED FILL — SUBOXONE 8 MG-2 MG SL FILM: 8-2 | 7 days supply | Qty: 21 | Fill #0

## 2016-02-07 MED FILL — SUBOXONE 8 MG-2 MG SL FILM: 8-2 | 14 days supply | Qty: 42 | Fill #0

## 2016-02-07 MED FILL — PROMETHAZINE 25 MG TABLET: 25 | 30 days supply | Qty: 90 | Fill #0

## 2016-02-21 MED FILL — SUBOXONE 8 MG-2 MG SL FILM: 8-2 | 14 days supply | Qty: 42 | Fill #0

## 2016-03-06 MED FILL — SUBOXONE 8 MG-2 MG SL FILM: 8-2 | 14 days supply | Qty: 42 | Fill #0

## 2016-03-27 MED FILL — SUBOXONE 8 MG-2 MG SL FILM: 8-2 | 28 days supply | Qty: 84 | Fill #0

## 2016-04-24 MED FILL — SUBOXONE 8 MG-2 MG SL FILM: 8-2 | 28 days supply | Qty: 84 | Fill #0

## 2016-05-20 MED FILL — PROMETHAZINE 25 MG TABLET: 25 | 30 days supply | Qty: 90 | Fill #0

## 2016-05-20 MED FILL — SUBOXONE 8 MG-2 MG SL FILM: 8-2 | 28 days supply | Qty: 84 | Fill #0

## 2016-06-17 MED FILL — SUBOXONE 8 MG-2 MG SL FILM: 8-2 | 14 days supply | Qty: 42 | Fill #0

## 2016-06-17 MED FILL — PROMETHAZINE 25 MG TABLET: 25 | 30 days supply | Qty: 90 | Fill #0

## 2016-07-01 DIAGNOSIS — K739 Chronic hepatitis, unspecified: Secondary | ICD-10-CM | POA: Diagnosis not present

## 2016-07-01 MED FILL — SUBOXONE 8 MG-2 MG SL FILM: 8-2 | 28 days supply | Qty: 84 | Fill #0

## 2016-07-22 DIAGNOSIS — K739 Chronic hepatitis, unspecified: Secondary | ICD-10-CM | POA: Diagnosis not present

## 2016-08-05 MED FILL — SUBOXONE 8 MG-2 MG SL FILM: 8-2 | 21 days supply | Qty: 63 | Fill #0

## 2016-08-26 MED FILL — SUBOXONE 8 MG-2 MG SL FILM: 8-2 | 28 days supply | Qty: 84 | Fill #0

## 2016-09-20 ENCOUNTER — Emergency Department (HOSPITAL_COMMUNITY)
Admission: EM | Admit: 2016-09-20 | Discharge: 2016-09-20 | Disposition: A | Discharge status: Home / Self Care | Payer: Medicare Other | Attending: Emergency Medicine | Admitting: Emergency Medicine

## 2016-09-20 ENCOUNTER — Encounter (HOSPITAL_COMMUNITY): Payer: Self-pay

## 2016-09-20 ENCOUNTER — Emergency Department (HOSPITAL_COMMUNITY): Payer: Medicare Other

## 2016-09-20 DIAGNOSIS — N390 Urinary tract infection, site not specified: Secondary | ICD-10-CM | POA: Diagnosis not present

## 2016-09-20 DIAGNOSIS — J45909 Unspecified asthma, uncomplicated: Secondary | ICD-10-CM | POA: Diagnosis not present

## 2016-09-20 DIAGNOSIS — F17290 Nicotine dependence, other tobacco product, uncomplicated: Secondary | ICD-10-CM | POA: Diagnosis not present

## 2016-09-20 DIAGNOSIS — R51 Headache: Secondary | ICD-10-CM | POA: Diagnosis not present

## 2016-09-20 DIAGNOSIS — Z79899 Other long term (current) drug therapy: Secondary | ICD-10-CM | POA: Diagnosis not present

## 2016-09-20 DIAGNOSIS — G43809 Other migraine, not intractable, without status migrainosus: Secondary | ICD-10-CM | POA: Diagnosis not present

## 2016-09-20 DIAGNOSIS — J449 Chronic obstructive pulmonary disease, unspecified: Secondary | ICD-10-CM | POA: Diagnosis not present

## 2016-09-20 DIAGNOSIS — I10 Essential (primary) hypertension: Secondary | ICD-10-CM | POA: Diagnosis not present

## 2016-09-20 DIAGNOSIS — R531 Weakness: Secondary | ICD-10-CM | POA: Diagnosis not present

## 2016-09-20 DIAGNOSIS — R404 Transient alteration of awareness: Secondary | ICD-10-CM | POA: Diagnosis not present

## 2016-09-20 DIAGNOSIS — G8929 Other chronic pain: Secondary | ICD-10-CM | POA: Insufficient documentation

## 2016-09-20 DIAGNOSIS — G43819 Other migraine, intractable, without status migrainosus: Secondary | ICD-10-CM | POA: Diagnosis not present

## 2016-09-20 HISTORY — DX: Cerebral infarction, unspecified: I63.9

## 2016-09-20 LAB — CBC WITH DIFFERENTIAL/PLATELET
BASOS ABS: 0 10*3/uL (ref 0.0–0.1)
Basophils Relative: 0 %
EOS PCT: 2 %
Eosinophils Absolute: 0.2 10*3/uL (ref 0.0–0.7)
HEMATOCRIT: 44.1 % (ref 36.0–46.0)
Hemoglobin: 15.4 g/dL — ABNORMAL HIGH (ref 12.0–15.0)
LYMPHS ABS: 3 10*3/uL (ref 0.7–4.0)
LYMPHS PCT: 35 %
MCH: 29.5 pg (ref 26.0–34.0)
MCHC: 34.9 g/dL (ref 30.0–36.0)
MCV: 84.5 fL (ref 78.0–100.0)
MONO ABS: 0.3 10*3/uL (ref 0.1–1.0)
Monocytes Relative: 3 %
NEUTROS ABS: 5.1 10*3/uL (ref 1.7–7.7)
Neutrophils Relative %: 60 %
PLATELETS: 175 10*3/uL (ref 150–400)
RBC: 5.22 MIL/uL — ABNORMAL HIGH (ref 3.87–5.11)
RDW: 13.6 % (ref 11.5–15.5)
WBC: 8.6 10*3/uL (ref 4.0–10.5)

## 2016-09-20 LAB — URINALYSIS, ROUTINE W REFLEX MICROSCOPIC
Bilirubin Urine: NEGATIVE
Glucose, UA: NEGATIVE mg/dL
Hgb urine dipstick: NEGATIVE
Ketones, ur: NEGATIVE mg/dL
Nitrite: NEGATIVE
Protein, ur: NEGATIVE mg/dL
Specific Gravity, Urine: 1.008 (ref 1.005–1.030)
Squamous Epithelial / HPF: NONE SEEN
pH: 9 — ABNORMAL HIGH (ref 5.0–8.0)

## 2016-09-20 LAB — COMPREHENSIVE METABOLIC PANEL WITH GFR
ALT: 35 U/L (ref 14–54)
AST: 38 U/L (ref 15–41)
Albumin: 4.5 g/dL (ref 3.5–5.0)
Alkaline Phosphatase: 72 U/L (ref 38–126)
Anion gap: 10 (ref 5–15)
BUN: 6 mg/dL (ref 6–20)
CO2: 28 mmol/L (ref 22–32)
Calcium: 10.1 mg/dL (ref 8.9–10.3)
Chloride: 104 mmol/L (ref 101–111)
Creatinine, Ser: 0.73 mg/dL (ref 0.44–1.00)
GFR calc Af Amer: >60 mL/min
GFR calc non Af Amer: >60 mL/min
Glucose, Bld: 108 mg/dL — ABNORMAL HIGH (ref 65–99)
Potassium: 3.8 mmol/L (ref 3.5–5.1)
Sodium: 142 mmol/L (ref 135–145)
Total Bilirubin: 0.7 mg/dL (ref 0.3–1.2)
Total Protein: 8.4 g/dL — ABNORMAL HIGH (ref 6.5–8.1)

## 2016-09-20 LAB — RAPID URINE DRUG SCREEN, HOSP PERFORMED
Amphetamines: NOT DETECTED
BARBITURATES: NOT DETECTED
Benzodiazepines: POSITIVE — AB
Cocaine: NOT DETECTED
Opiates: NOT DETECTED
Tetrahydrocannabinol: POSITIVE — AB

## 2016-09-20 LAB — ETHANOL: Alcohol, Ethyl (B): <5 mg/dL (ref <5)

## 2016-09-20 MED ORDER — METOCLOPRAMIDE HCL 10 MG PO TABS
10.0000 mg | ORAL_TABLET | Freq: Once | ORAL | Status: AC
  Administered 2016-09-20: 10 mg via ORAL
  Filled 2016-09-20: qty 1

## 2016-09-20 MED ORDER — PROMETHAZINE HCL 25 MG/ML IJ SOLN
25.0000 mg | Freq: Once | INTRAMUSCULAR | Status: AC
  Administered 2016-09-20: 25 mg via INTRAVENOUS
  Filled 2016-09-20: qty 1

## 2016-09-20 MED ORDER — NITROFURANTOIN MONOHYD MACRO 100 MG PO CAPS: 100.0000 mg | ORAL_CAPSULE | Freq: Two times a day (BID) | ORAL | 0 refills | Status: AC

## 2016-09-20 MED ORDER — METHYLPREDNISOLONE SODIUM SUCC 125 MG IJ SOLR
125.0000 mg | Freq: Once | INTRAMUSCULAR | Status: AC
  Administered 2016-09-20: 125 mg via INTRAVENOUS
  Filled 2016-09-20: qty 2

## 2016-09-20 MED ORDER — SODIUM CHLORIDE 0.9 % IV BOLUS (SEPSIS): 1000.0000 mL | Freq: Once | INTRAVENOUS | Status: DC

## 2016-09-20 MED ORDER — ACETAMINOPHEN 500 MG PO TABS
1000.0000 mg | ORAL_TABLET | Freq: Once | ORAL | Status: AC
  Administered 2016-09-20: 1000 mg via ORAL
  Filled 2016-09-20: qty 2

## 2016-09-20 MED ORDER — DIPHENHYDRAMINE HCL 25 MG PO CAPS
25.0000 mg | ORAL_CAPSULE | Freq: Once | ORAL | Status: AC
  Administered 2016-09-20: 25 mg via ORAL
  Filled 2016-09-20: qty 1

## 2016-09-20 MED ORDER — DIPHENHYDRAMINE HCL 50 MG/ML IJ SOLN
25.0000 mg | Freq: Once | INTRAMUSCULAR | Status: DC
  Filled 2016-09-20: qty 1

## 2016-09-20 MED ORDER — METOCLOPRAMIDE HCL 5 MG/ML IJ SOLN
10.0000 mg | Freq: Once | INTRAMUSCULAR | Status: DC
  Filled 2016-09-20: qty 2

## 2016-09-20 MED ORDER — SODIUM CHLORIDE 0.9 % IV BOLUS (SEPSIS)
1000.0000 mL | Freq: Once | INTRAVENOUS | Status: AC
  Administered 2016-09-20: 1000 mL via INTRAVENOUS

## 2016-09-20 NOTE — ED Notes (Signed)
Awaiting Radiology

## 2016-09-20 NOTE — ED Notes (Signed)
Med given to pt as ordered and pt reports "phenergan won't help me"- I have phenergan at home"   Upset that she is not being given "pain medicine"  Request to see physician

## 2016-09-20 NOTE — ED Notes (Signed)
Pt watching TV, non photophobic as well as watching TV

## 2016-09-20 NOTE — ED Notes (Signed)
Pt mother request po's for pt as well as pain med

## 2016-09-20 NOTE — ED Notes (Signed)
Dr Jeannie FendY in to reassess
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
## 2016-09-20 NOTE — ED Triage Notes (Signed)
Per ems pt c/o  Severe headache that started 2 days ago.    Reports started having left sided weakness and left sided facial droop around 10am this morning.   Pt had a stroke in February and received tpa.  Pt says she had regained most of her strength back since then.

## 2016-09-20 NOTE — ED Notes (Signed)
Awaiting radiology.

## 2016-09-20 NOTE — ED Notes (Signed)
Dr Y at bedside 

## 2016-09-20 NOTE — ED Notes (Signed)
Pt distressed that she has had no other pain meds- asks to speak with physician and learn results of her CT scan  Dr Jeannie FendY informed and will come peak with pt

## 2016-09-23 DIAGNOSIS — K739 Chronic hepatitis, unspecified: Secondary | ICD-10-CM | POA: Diagnosis not present

## 2016-09-23 MED FILL — PROMETHAZINE 25 MG TABLET: 25 | 30 days supply | Qty: 90 | Fill #0

## 2016-09-23 MED FILL — SUBOXONE 8 MG-2 MG SL FILM: 8-2 | 28 days supply | Qty: 84 | Fill #0

## 2016-10-21 DIAGNOSIS — K739 Chronic hepatitis, unspecified: Secondary | ICD-10-CM | POA: Diagnosis not present

## 2016-10-21 MED FILL — PROMETHAZINE 25 MG TABLET: 25 | 30 days supply | Qty: 90 | Fill #0

## 2016-10-21 MED FILL — SUBOXONE 8 MG-2 MG SL FILM: 8-2 | 28 days supply | Qty: 84 | Fill #0

## 2016-10-27 DIAGNOSIS — R739 Hyperglycemia, unspecified: Secondary | ICD-10-CM | POA: Diagnosis not present

## 2016-10-27 DIAGNOSIS — R0602 Shortness of breath: Secondary | ICD-10-CM | POA: Diagnosis not present

## 2016-10-27 DIAGNOSIS — S80862A Insect bite (nonvenomous), left lower leg, initial encounter: Secondary | ICD-10-CM | POA: Diagnosis not present

## 2016-10-27 DIAGNOSIS — R079 Chest pain, unspecified: Secondary | ICD-10-CM | POA: Diagnosis not present

## 2016-10-27 DIAGNOSIS — J9690 Respiratory failure, unspecified, unspecified whether with hypoxia or hypercapnia: Secondary | ICD-10-CM | POA: Diagnosis not present

## 2016-10-27 DIAGNOSIS — Z7982 Long term (current) use of aspirin: Secondary | ICD-10-CM | POA: Diagnosis not present

## 2016-10-27 DIAGNOSIS — J189 Pneumonia, unspecified organism: Secondary | ICD-10-CM | POA: Diagnosis not present

## 2016-10-27 DIAGNOSIS — S40861A Insect bite (nonvenomous) of right upper arm, initial encounter: Secondary | ICD-10-CM | POA: Diagnosis not present

## 2016-10-27 DIAGNOSIS — J449 Chronic obstructive pulmonary disease, unspecified: Secondary | ICD-10-CM | POA: Diagnosis not present

## 2016-10-27 DIAGNOSIS — J441 Chronic obstructive pulmonary disease with (acute) exacerbation: Secondary | ICD-10-CM | POA: Diagnosis not present

## 2016-10-27 DIAGNOSIS — Z886 Allergy status to analgesic agent status: Secondary | ICD-10-CM | POA: Diagnosis not present

## 2016-10-27 DIAGNOSIS — J9601 Acute respiratory failure with hypoxia: Secondary | ICD-10-CM | POA: Diagnosis not present

## 2016-10-27 DIAGNOSIS — M549 Dorsalgia, unspecified: Secondary | ICD-10-CM | POA: Diagnosis not present

## 2016-10-27 DIAGNOSIS — I252 Old myocardial infarction: Secondary | ICD-10-CM | POA: Diagnosis not present

## 2016-10-27 DIAGNOSIS — R0902 Hypoxemia: Secondary | ICD-10-CM | POA: Diagnosis not present

## 2016-10-27 DIAGNOSIS — A419 Sepsis, unspecified organism: Secondary | ICD-10-CM | POA: Diagnosis not present

## 2016-10-27 DIAGNOSIS — Z8673 Personal history of transient ischemic attack (TIA), and cerebral infarction without residual deficits: Secondary | ICD-10-CM | POA: Diagnosis not present

## 2016-10-27 DIAGNOSIS — R071 Chest pain on breathing: Secondary | ICD-10-CM | POA: Diagnosis not present

## 2016-10-27 DIAGNOSIS — Z881 Allergy status to other antibiotic agents status: Secondary | ICD-10-CM | POA: Diagnosis not present

## 2016-10-27 DIAGNOSIS — Z720 Tobacco use: Secondary | ICD-10-CM | POA: Diagnosis not present

## 2016-10-27 DIAGNOSIS — G8929 Other chronic pain: Secondary | ICD-10-CM | POA: Diagnosis not present

## 2016-10-27 DIAGNOSIS — Z452 Encounter for adjustment and management of vascular access device: Secondary | ICD-10-CM | POA: Diagnosis not present

## 2016-10-27 DIAGNOSIS — S40862A Insect bite (nonvenomous) of left upper arm, initial encounter: Secondary | ICD-10-CM | POA: Diagnosis not present

## 2016-10-27 DIAGNOSIS — Z888 Allergy status to other drugs, medicaments and biological substances status: Secondary | ICD-10-CM | POA: Diagnosis not present

## 2016-10-27 DIAGNOSIS — M542 Cervicalgia: Secondary | ICD-10-CM | POA: Diagnosis not present

## 2016-10-27 DIAGNOSIS — J810 Acute pulmonary edema: Secondary | ICD-10-CM | POA: Diagnosis not present

## 2016-10-27 DIAGNOSIS — S30861A Insect bite (nonvenomous) of abdominal wall, initial encounter: Secondary | ICD-10-CM | POA: Diagnosis not present

## 2016-10-27 DIAGNOSIS — T380X5A Adverse effect of glucocorticoids and synthetic analogues, initial encounter: Secondary | ICD-10-CM | POA: Diagnosis not present

## 2016-10-27 DIAGNOSIS — J180 Bronchopneumonia, unspecified organism: Secondary | ICD-10-CM | POA: Diagnosis not present

## 2016-10-27 DIAGNOSIS — J80 Acute respiratory distress syndrome: Secondary | ICD-10-CM | POA: Diagnosis not present

## 2016-10-27 DIAGNOSIS — J440 Chronic obstructive pulmonary disease with acute lower respiratory infection: Secondary | ICD-10-CM | POA: Diagnosis not present

## 2016-10-27 DIAGNOSIS — S80861A Insect bite (nonvenomous), right lower leg, initial encounter: Secondary | ICD-10-CM | POA: Diagnosis not present

## 2016-10-27 DIAGNOSIS — R0789 Other chest pain: Secondary | ICD-10-CM | POA: Diagnosis not present

## 2016-10-27 DIAGNOSIS — Z79899 Other long term (current) drug therapy: Secondary | ICD-10-CM | POA: Diagnosis not present

## 2016-10-31 ENCOUNTER — Inpatient Hospital Stay (HOSPITAL_COMMUNITY): Payer: Medicare Other

## 2016-10-31 ENCOUNTER — Inpatient Hospital Stay (HOSPITAL_COMMUNITY)
Admission: AD | Admit: 2016-10-31 | Discharge: 2016-11-08 | DRG: 870 | Disposition: A | Discharge status: Home / Self Care | Payer: Medicare Other | Source: Other Acute Inpatient Hospital | Attending: Family Medicine | Admitting: Family Medicine

## 2016-10-31 DIAGNOSIS — F1121 Opioid dependence, in remission: Secondary | ICD-10-CM | POA: Diagnosis not present

## 2016-10-31 DIAGNOSIS — J9600 Acute respiratory failure, unspecified whether with hypoxia or hypercapnia: Secondary | ICD-10-CM | POA: Diagnosis not present

## 2016-10-31 DIAGNOSIS — J449 Chronic obstructive pulmonary disease, unspecified: Secondary | ICD-10-CM | POA: Diagnosis not present

## 2016-10-31 DIAGNOSIS — R6520 Severe sepsis without septic shock: Secondary | ICD-10-CM | POA: Diagnosis present

## 2016-10-31 DIAGNOSIS — Z888 Allergy status to other drugs, medicaments and biological substances status: Secondary | ICD-10-CM

## 2016-10-31 DIAGNOSIS — D649 Anemia, unspecified: Secondary | ICD-10-CM | POA: Diagnosis not present

## 2016-10-31 DIAGNOSIS — I10 Essential (primary) hypertension: Secondary | ICD-10-CM | POA: Diagnosis not present

## 2016-10-31 DIAGNOSIS — J80 Acute respiratory distress syndrome: Secondary | ICD-10-CM | POA: Diagnosis not present

## 2016-10-31 DIAGNOSIS — R739 Hyperglycemia, unspecified: Secondary | ICD-10-CM | POA: Diagnosis not present

## 2016-10-31 DIAGNOSIS — M542 Cervicalgia: Secondary | ICD-10-CM | POA: Diagnosis not present

## 2016-10-31 DIAGNOSIS — Z8673 Personal history of transient ischemic attack (TIA), and cerebral infarction without residual deficits: Secondary | ICD-10-CM | POA: Diagnosis not present

## 2016-10-31 DIAGNOSIS — F1120 Opioid dependence, uncomplicated: Secondary | ICD-10-CM | POA: Diagnosis present

## 2016-10-31 DIAGNOSIS — G8929 Other chronic pain: Secondary | ICD-10-CM | POA: Diagnosis present

## 2016-10-31 DIAGNOSIS — Z9911 Dependence on respirator [ventilator] status: Secondary | ICD-10-CM

## 2016-10-31 DIAGNOSIS — R030 Elevated blood-pressure reading, without diagnosis of hypertension: Secondary | ICD-10-CM | POA: Diagnosis not present

## 2016-10-31 DIAGNOSIS — J154 Pneumonia due to other streptococci: Secondary | ICD-10-CM | POA: Diagnosis present

## 2016-10-31 DIAGNOSIS — F319 Bipolar disorder, unspecified: Secondary | ICD-10-CM | POA: Diagnosis present

## 2016-10-31 DIAGNOSIS — E870 Hyperosmolality and hypernatremia: Secondary | ICD-10-CM | POA: Diagnosis not present

## 2016-10-31 DIAGNOSIS — J440 Chronic obstructive pulmonary disease with acute lower respiratory infection: Secondary | ICD-10-CM | POA: Diagnosis present

## 2016-10-31 DIAGNOSIS — F17290 Nicotine dependence, other tobacco product, uncomplicated: Secondary | ICD-10-CM | POA: Diagnosis present

## 2016-10-31 DIAGNOSIS — E871 Hypo-osmolality and hyponatremia: Secondary | ICD-10-CM | POA: Diagnosis present

## 2016-10-31 DIAGNOSIS — J9602 Acute respiratory failure with hypercapnia: Secondary | ICD-10-CM | POA: Diagnosis not present

## 2016-10-31 DIAGNOSIS — J189 Pneumonia, unspecified organism: Secondary | ICD-10-CM | POA: Diagnosis not present

## 2016-10-31 DIAGNOSIS — Z452 Encounter for adjustment and management of vascular access device: Secondary | ICD-10-CM | POA: Diagnosis not present

## 2016-10-31 DIAGNOSIS — E876 Hypokalemia: Secondary | ICD-10-CM | POA: Diagnosis present

## 2016-10-31 DIAGNOSIS — E872 Acidosis: Secondary | ICD-10-CM | POA: Diagnosis not present

## 2016-10-31 DIAGNOSIS — Z9289 Personal history of other medical treatment: Secondary | ICD-10-CM

## 2016-10-31 DIAGNOSIS — A419 Sepsis, unspecified organism: Secondary | ICD-10-CM | POA: Diagnosis not present

## 2016-10-31 DIAGNOSIS — J9601 Acute respiratory failure with hypoxia: Secondary | ICD-10-CM | POA: Diagnosis present

## 2016-10-31 DIAGNOSIS — J9690 Respiratory failure, unspecified, unspecified whether with hypoxia or hypercapnia: Secondary | ICD-10-CM | POA: Diagnosis not present

## 2016-10-31 DIAGNOSIS — M797 Fibromyalgia: Secondary | ICD-10-CM | POA: Diagnosis not present

## 2016-10-31 DIAGNOSIS — J90 Pleural effusion, not elsewhere classified: Secondary | ICD-10-CM | POA: Diagnosis not present

## 2016-10-31 DIAGNOSIS — Z0189 Encounter for other specified special examinations: Secondary | ICD-10-CM

## 2016-10-31 DIAGNOSIS — R0600 Dyspnea, unspecified: Secondary | ICD-10-CM

## 2016-10-31 LAB — GLUCOSE, CAPILLARY
GLUCOSE-CAPILLARY: 378 mg/dL — AB (ref 65–99)
Glucose-Capillary: 154 mg/dL — ABNORMAL HIGH (ref 65–99)
Glucose-Capillary: 194 mg/dL — ABNORMAL HIGH (ref 65–99)
Glucose-Capillary: 203 mg/dL — ABNORMAL HIGH (ref 65–99)

## 2016-10-31 LAB — RAPID URINE DRUG SCREEN, HOSP PERFORMED
Amphetamines: NOT DETECTED
BARBITURATES: NOT DETECTED
BENZODIAZEPINES: POSITIVE — AB
COCAINE: NOT DETECTED
Opiates: NOT DETECTED
Tetrahydrocannabinol: POSITIVE — AB

## 2016-10-31 LAB — POCT I-STAT 3, ART BLOOD GAS (G3+)
Acid-Base Excess: 7 mmol/L — ABNORMAL HIGH (ref 0.0–2.0)
BICARBONATE: 34.5 mmol/L — AB (ref 20.0–28.0)
O2 SAT: 100 %
TCO2: 36 mmol/L — AB (ref 22–32)
pCO2 arterial: 63.6 mmHg — ABNORMAL HIGH (ref 32.0–48.0)
pH, Arterial: 7.342 — ABNORMAL LOW (ref 7.350–7.450)
pO2, Arterial: 196 mmHg — ABNORMAL HIGH (ref 83.0–108.0)

## 2016-10-31 LAB — URINALYSIS, ROUTINE W REFLEX MICROSCOPIC
BACTERIA UA: NONE SEEN
BILIRUBIN URINE: NEGATIVE
Glucose, UA: NEGATIVE mg/dL
HGB URINE DIPSTICK: NEGATIVE
KETONES UR: 5 mg/dL — AB
LEUKOCYTES UA: NEGATIVE
NITRITE: NEGATIVE
PROTEIN: 30 mg/dL — AB
Specific Gravity, Urine: 1.014 (ref 1.005–1.030)
Squamous Epithelial / LPF: NONE SEEN
pH: 5 (ref 5.0–8.0)

## 2016-10-31 LAB — RESPIRATORY PANEL BY PCR
Adenovirus: NOT DETECTED
BORDETELLA PERTUSSIS-RVPCR: NOT DETECTED
CORONAVIRUS 229E-RVPPCR: NOT DETECTED
CORONAVIRUS HKU1-RVPPCR: NOT DETECTED
CORONAVIRUS NL63-RVPPCR: NOT DETECTED
CORONAVIRUS OC43-RVPPCR: NOT DETECTED
Chlamydophila pneumoniae: NOT DETECTED
Influenza A: NOT DETECTED
Influenza B: NOT DETECTED
METAPNEUMOVIRUS-RVPPCR: NOT DETECTED
Mycoplasma pneumoniae: NOT DETECTED
PARAINFLUENZA VIRUS 2-RVPPCR: NOT DETECTED
Parainfluenza Virus 1: NOT DETECTED
Parainfluenza Virus 3: NOT DETECTED
Parainfluenza Virus 4: NOT DETECTED
RESPIRATORY SYNCYTIAL VIRUS-RVPPCR: NOT DETECTED
RHINOVIRUS / ENTEROVIRUS - RVPPCR: NOT DETECTED

## 2016-10-31 LAB — STREP PNEUMONIAE URINARY ANTIGEN: Strep Pneumo Urinary Antigen: NEGATIVE

## 2016-10-31 LAB — TRIGLYCERIDES: TRIGLYCERIDES: 362 mg/dL — AB (ref <150)

## 2016-10-31 LAB — MRSA PCR SCREENING: MRSA BY PCR: NEGATIVE

## 2016-10-31 LAB — PROCALCITONIN: PROCALCITONIN: 0.16 ng/mL

## 2016-10-31 LAB — PHOSPHORUS: Phosphorus: 3.7 mg/dL (ref 2.5–4.6)

## 2016-10-31 LAB — LACTIC ACID, PLASMA: Lactic Acid, Venous: 1.5 mmol/L (ref 0.5–1.9)

## 2016-10-31 LAB — MAGNESIUM: MAGNESIUM: 2.4 mg/dL (ref 1.7–2.4)

## 2016-10-31 MED ORDER — PROPOFOL 1000 MG/100ML IV EMUL
25.0000 ug/kg/min | INTRAVENOUS | Status: DC
  Administered 2016-11-01 (×5): 60 ug/kg/min via INTRAVENOUS
  Administered 2016-11-01: 80 ug/kg/min via INTRAVENOUS
  Administered 2016-11-01 (×2): 60 ug/kg/min via INTRAVENOUS
  Administered 2016-11-02 (×3): 80 ug/kg/min via INTRAVENOUS
  Filled 2016-10-31 (×9): qty 100
  Filled 2016-10-31: qty 200
  Filled 2016-10-31: qty 100

## 2016-10-31 MED ORDER — LAMOTRIGINE 100 MG PO TABS: 100.0000 mg | ORAL_TABLET | Freq: Every day | ORAL | Status: DC

## 2016-10-31 MED ORDER — INSULIN ASPART 100 UNIT/ML IV SOLN: 10.0000 [IU] | Freq: Once | INTRAVENOUS | Status: DC

## 2016-10-31 MED ORDER — VITAL HIGH PROTEIN PO LIQD
1000.0000 mL | ORAL | Status: DC
  Administered 2016-10-31: 1000 mL

## 2016-10-31 MED ORDER — DEXTROSE 5 % IV SOLN
500.0000 mg | INTRAVENOUS | Status: DC
  Administered 2016-10-31 – 2016-11-02 (×3): 500 mg via INTRAVENOUS
  Filled 2016-10-31 (×3): qty 500

## 2016-10-31 MED ORDER — FENTANYL 2500MCG IN NS 250ML (10MCG/ML) PREMIX INFUSION
25.0000 ug/h | INTRAVENOUS | Status: DC
  Administered 2016-10-31: 400 ug/h via INTRAVENOUS
  Filled 2016-10-31 (×2): qty 250

## 2016-10-31 MED ORDER — VITAL HIGH PROTEIN PO LIQD
1000.0000 mL | ORAL | Status: DC
  Administered 2016-10-31 – 2016-11-02 (×3): 1000 mL
  Administered 2016-11-03: 19:00:00
  Administered 2016-11-03: 1000 mL
  Administered 2016-11-03: 05:00:00
  Administered 2016-11-04: 1000 mL

## 2016-10-31 MED ORDER — FLUOXETINE HCL 20 MG/5ML PO SOLN
20.0000 mg | Freq: Every day | ORAL | Status: DC
  Administered 2016-10-31 – 2016-11-05 (×6): 20 mg
  Filled 2016-10-31 (×6): qty 5

## 2016-10-31 MED ORDER — METHYLPREDNISOLONE SODIUM SUCC 40 MG IJ SOLR
40.0000 mg | Freq: Two times a day (BID) | INTRAMUSCULAR | Status: DC
  Administered 2016-10-31 – 2016-11-01 (×2): 40 mg via INTRAVENOUS
  Filled 2016-10-31 (×2): qty 1

## 2016-10-31 MED ORDER — ROCURONIUM BROMIDE 50 MG/5ML IV SOLN
1.0000 mg/kg | Freq: Once | INTRAVENOUS | Status: AC
  Administered 2016-10-31: 99 mg via INTRAVENOUS
  Filled 2016-10-31: qty 9.9

## 2016-10-31 MED ORDER — OLANZAPINE 10 MG PO TABS
10.0000 mg | ORAL_TABLET | Freq: Every day | ORAL | Status: DC
  Administered 2016-10-31 – 2016-11-04 (×4): 10 mg
  Filled 2016-10-31 (×5): qty 1

## 2016-10-31 MED ORDER — STERILE WATER FOR INJECTION IV SOLN
INTRAVENOUS | Status: DC
  Administered 2016-10-31: via INTRAVENOUS
  Filled 2016-10-31 (×3): qty 850

## 2016-10-31 MED ORDER — LAMOTRIGINE 5 MG PO CHEW
100.0000 mg | CHEWABLE_TABLET | Freq: Every day | ORAL | Status: DC
  Administered 2016-10-31: 100 mg
  Filled 2016-10-31: qty 20

## 2016-10-31 MED ORDER — FENTANYL CITRATE (PF) 100 MCG/2ML IJ SOLN: 100.0000 ug | Freq: Once | INTRAMUSCULAR | Status: DC

## 2016-10-31 MED ORDER — FENTANYL 2500MCG IN NS 250ML (10MCG/ML) PREMIX INFUSION
25.0000 ug/h | INTRAVENOUS | Status: DC
  Administered 2016-11-01 (×4): 400 ug/h via INTRAVENOUS
  Administered 2016-11-02: 350 ug/h via INTRAVENOUS
  Administered 2016-11-02: 400 ug/h via INTRAVENOUS
  Filled 2016-10-31 (×6): qty 250

## 2016-10-31 MED ORDER — IPRATROPIUM-ALBUTEROL 0.5-2.5 (3) MG/3ML IN SOLN
3.0000 mL | Freq: Four times a day (QID) | RESPIRATORY_TRACT | Status: DC
  Administered 2016-10-31 – 2016-11-07 (×28): 3 mL via RESPIRATORY_TRACT
  Filled 2016-10-31 (×27): qty 3

## 2016-10-31 MED ORDER — FENTANYL BOLUS VIA INFUSION
50.0000 ug | INTRAVENOUS | Status: DC | PRN
  Filled 2016-10-31: qty 50

## 2016-10-31 MED ORDER — CHLORHEXIDINE GLUCONATE 0.12% ORAL RINSE (MEDLINE KIT)
15.0000 mL | Freq: Two times a day (BID) | OROMUCOSAL | Status: DC
  Administered 2016-10-31 – 2016-11-05 (×11): 15 mL via OROMUCOSAL

## 2016-10-31 MED ORDER — IPRATROPIUM-ALBUTEROL 0.5-2.5 (3) MG/3ML IN SOLN
RESPIRATORY_TRACT | Status: AC
Start: 2016-10-31 — End: 2016-10-31
  Administered 2016-10-31: 3 mL
  Filled 2016-10-31: qty 3

## 2016-10-31 MED ORDER — SODIUM CHLORIDE 0.9 % IV SOLN
3.0000 ug/kg/min | INTRAVENOUS | Status: DC
  Administered 2016-10-31 – 2016-11-02 (×3): 3 ug/kg/min via INTRAVENOUS
  Filled 2016-10-31 (×4): qty 20

## 2016-10-31 MED ORDER — MIDAZOLAM HCL 2 MG/2ML IJ SOLN: 1.0000 mg | INTRAMUSCULAR | Status: DC | PRN

## 2016-10-31 MED ORDER — PANTOPRAZOLE SODIUM 40 MG IV SOLR
40.0000 mg | Freq: Every day | INTRAVENOUS | Status: DC
  Administered 2016-10-31 – 2016-11-05 (×6): 40 mg via INTRAVENOUS
  Filled 2016-10-31 (×6): qty 40

## 2016-10-31 MED ORDER — FENTANYL BOLUS VIA INFUSION
50.0000 ug | INTRAVENOUS | Status: DC | PRN
  Administered 2016-11-02: 50 ug via INTRAVENOUS
  Filled 2016-10-31: qty 50

## 2016-10-31 MED ORDER — INSULIN ASPART 100 UNIT/ML ~~LOC~~ SOLN
2.0000 [IU] | SUBCUTANEOUS | Status: DC
  Administered 2016-10-31: 4 [IU] via SUBCUTANEOUS
  Administered 2016-10-31: 6 [IU] via SUBCUTANEOUS
  Administered 2016-10-31: 2 [IU] via SUBCUTANEOUS
  Administered 2016-10-31: 6 [IU] via SUBCUTANEOUS

## 2016-10-31 MED ORDER — PRO-STAT SUGAR FREE PO LIQD
30.0000 mL | Freq: Two times a day (BID) | ORAL | Status: DC
  Administered 2016-10-31: 30 mL
  Filled 2016-10-31: qty 30

## 2016-10-31 MED ORDER — ARFORMOTEROL TARTRATE 15 MCG/2ML IN NEBU
15.0000 ug | INHALATION_SOLUTION | Freq: Two times a day (BID) | RESPIRATORY_TRACT | Status: DC
  Administered 2016-10-31 – 2016-11-08 (×15): 15 ug via RESPIRATORY_TRACT
  Filled 2016-10-31 (×19): qty 2

## 2016-10-31 MED ORDER — ARTIFICIAL TEARS OPHTHALMIC OINT
1.0000 "application " | TOPICAL_OINTMENT | Freq: Three times a day (TID) | OPHTHALMIC | Status: DC
  Administered 2016-10-31 – 2016-11-02 (×5): 1 via OPHTHALMIC
  Filled 2016-10-31: qty 3.5

## 2016-10-31 MED ORDER — VITAL HIGH PROTEIN PO LIQD: 1000.0000 mL | ORAL | Status: DC

## 2016-10-31 MED ORDER — PRO-STAT SUGAR FREE PO LIQD
60.0000 mL | Freq: Three times a day (TID) | ORAL | Status: DC
  Administered 2016-10-31 – 2016-11-05 (×14): 60 mL
  Filled 2016-10-31 (×14): qty 60

## 2016-10-31 MED ORDER — FENTANYL CITRATE (PF) 100 MCG/2ML IJ SOLN
50.0000 ug | Freq: Once | INTRAMUSCULAR | Status: AC
  Administered 2016-10-31: 50 ug via INTRAVENOUS

## 2016-10-31 MED ORDER — PROPOFOL 1000 MG/100ML IV EMUL
0.0000 ug/kg/min | INTRAVENOUS | Status: DC
  Administered 2016-10-31 (×3): 50 ug/kg/min via INTRAVENOUS
  Filled 2016-10-31 (×4): qty 100

## 2016-10-31 MED ORDER — INSULIN ASPART 100 UNIT/ML IV SOLN
8.0000 [IU] | Freq: Once | INTRAVENOUS | Status: AC
  Administered 2016-10-31: 8 [IU] via INTRAVENOUS

## 2016-10-31 MED ORDER — LAMOTRIGINE 100 MG PO TABS
100.0000 mg | ORAL_TABLET | Freq: Every day | ORAL | Status: DC
  Administered 2016-11-01 – 2016-11-05 (×5): 100 mg
  Filled 2016-10-31 (×5): qty 1

## 2016-10-31 MED ORDER — MIDAZOLAM HCL 2 MG/2ML IJ SOLN
1.0000 mg | INTRAMUSCULAR | Status: DC | PRN
  Administered 2016-10-31 – 2016-11-03 (×6): 4 mg via INTRAVENOUS
  Administered 2016-11-03 – 2016-11-04 (×2): 2 mg via INTRAVENOUS
  Administered 2016-11-04: 4 mg via INTRAVENOUS
  Filled 2016-10-31 (×3): qty 4
  Filled 2016-10-31 (×2): qty 2
  Filled 2016-10-31 (×4): qty 4

## 2016-10-31 MED ORDER — BENZTROPINE MESYLATE 0.5 MG PO TABS
0.5000 mg | ORAL_TABLET | Freq: Two times a day (BID) | ORAL | Status: DC
  Administered 2016-10-31 – 2016-11-05 (×12): 0.5 mg
  Filled 2016-10-31 (×13): qty 1

## 2016-10-31 MED ORDER — STERILE DILUENT FOR HUMULIN INSULINS: 8.0000 [IU] | Freq: Once | SUBCUTANEOUS | Status: DC

## 2016-10-31 MED ORDER — SODIUM CHLORIDE 0.9 % IV SOLN
250.0000 mL | INTRAVENOUS | Status: DC | PRN
  Administered 2016-10-31 – 2016-11-04 (×2): 250 mL via INTRAVENOUS

## 2016-10-31 MED ORDER — FENTANYL CITRATE (PF) 100 MCG/2ML IJ SOLN: 100.0000 ug | Freq: Once | INTRAMUSCULAR | Status: DC | PRN

## 2016-10-31 MED ORDER — DEXTROSE 5 % IV SOLN
1.0000 g | INTRAVENOUS | Status: AC
  Administered 2016-10-31 – 2016-11-04 (×5): 1 g via INTRAVENOUS
  Filled 2016-10-31 (×5): qty 10

## 2016-10-31 MED ORDER — HEPARIN SODIUM (PORCINE) 5000 UNIT/ML IJ SOLN: 5000.0000 [IU] | Freq: Three times a day (TID) | INTRAMUSCULAR | Status: DC

## 2016-10-31 MED ORDER — ORAL CARE MOUTH RINSE
15.0000 mL | OROMUCOSAL | Status: DC
  Administered 2016-10-31 – 2016-11-05 (×50): 15 mL via OROMUCOSAL

## 2016-10-31 NOTE — Procedures (Signed)
Central Venous Catheter Insertion Procedure Note Latasha [name] [id number] 11/08/1975  Procedure: Insertion of Central Venous Catheter Indications: Assessment of intravascular volume, Drug and/or fluid administration and Frequent blood sampling  Procedure Details Consent: Unable to obtain consent because of emergent medical necessity. Time Out: Verified patient identification, verified procedure, site/side was marked, verified correct patient position, special equipment/implants available, medications/allergies/relevent history reviewed, required imaging and test results available.  Performed  Maximum sterile technique was used including antiseptics, cap, gloves, gown, hand hygiene, mask and sheet. Skin prep: Chlorhexidine; local anesthetic administered A antimicrobial bonded/coated triple lumen catheter was placed in the right internal jugular vein using the Seldinger technique.  Evaluation Blood flow good Complications: No apparent complications Patient did tolerate procedure well. Chest X-ray ordered to verify placement.  CXR: pending.  Latasha KussmaulKatalina [name], AGACNP-BC [name]  Pgr: [phone]  PCCM Pgr: [phone]

## 2016-10-31 NOTE — H&P (Signed)
PULMONARY / CRITICAL CARE MEDICINE   Name: Latasha [name] MRN: [id number] DOB: 08/10/1975    ADMISSION DATE:  10/31/2016 CONSULTATION DATE:  8/31  REFERRING MD:  From [facility]   CHIEF COMPLAINT:  ARDS  HISTORY OF PRESENT ILLNESS:   This is a 41 year old female w/ multiple medical problems. Was admitted on 8/28 to [facility] w/ initial cc: fever (103), shortness of breath and chest pain. Was admitted w/ working dx of CAP. Respiratory status declined and ultimately required intubation on 8/30 w/ CXR showing patchy bilateral airspace disease. Transferred to [facility] w/ working dx of sepsis, CAP and ARDS   PAST MEDICAL HISTORY :  She  has a past medical history of ADHD (attention deficit hyperactivity disorder); Anxiety and depression; Arthritis; Asthma; Bipolar disorder (HCC); Chronic back pain; Chronic hip pain; Chronic neck pain; COPD (chronic obstructive pulmonary disease) (HCC); DDD (degenerative disc disease); Fibromyalgia; Headache(784.0); High cholesterol; Pneumonia; PTSD (post-traumatic stress disorder); Sciatica; Seizure (HCC); Shortness of breath; and Stroke (HCC).  PAST SURGICAL HISTORY: She  has a past surgical history that includes Abdominal hysterectomy; ovary removed (bilateral); Leg Surgery (left leg); facial reconstructive ; Knee arthroscopy with lateral menisectomy (Left, 03/11/2013); and Anterior cruciate ligament repair.  Allergies  Allergen Reactions  . Aripiprazole Other (See Comments)    Jerks, Locks up jaws.   . Cephalexin Nausea And Vomiting  . Haldol [Haloperidol Decanoate] Swelling and Other (See Comments)    Locks up jaws  . Tegretol [Carbamazepine] Other (See Comments)    Blisters, Sores  . Zofran Other (See Comments)    Locks up jaw, restlessness   . Ketorolac Tromethamine Rash    No current facility-administered medications on file prior to encounter.    Current Outpatient Prescriptions on File Prior to Encounter  Medication Sig  .  albuterol (PROAIR HFA) 108 (90 BASE) MCG/ACT inhaler Inhale 2 puffs into the lungs every 6 (six) hours as needed. Shortness of breath.  . B Complex-C (B-COMPLEX WITH VITAMIN C) tablet Take 1 tablet by mouth daily.  . benztropine (COGENTIN) 0.5 MG tablet Take 0.5 mg by mouth 2 (two) times daily.  . Biotin 1000 MCG tablet Take 1,000 mcg by mouth 3 (three) times daily.  . Chromium 1 MG CAPS Take 1 capsule by mouth daily.  . diazepam (VALIUM) 10 MG tablet Take 10 mg by mouth every 8 (eight) hours.   Marland Kitchen FLUoxetine (PROZAC) 20 MG capsule Take 20 mg by mouth daily.  Marland Kitchen gabapentin (NEURONTIN) 800 MG tablet Take 800 mg by mouth 4 (four) times daily.   . hydrocortisone cream 1 % Apply 1 application topically 3 (three) times daily.  Marland Kitchen ibuprofen (ADVIL,MOTRIN) 200 MG tablet Take 800 mg by mouth every 4 (four) hours as needed. pain  . ipratropium-albuterol (DUONEB) 0.5-2.5 (3) MG/3ML SOLN Take 3 mLs by nebulization.  Marland Kitchen lamoTRIgine (LAMICTAL) 100 MG tablet Take 100 mg by mouth daily.   Marland Kitchen OLANZapine (ZYPREXA) 10 MG tablet Take 10 mg by mouth at bedtime.  . SUBOXONE 8-2 MG FILM Take 1 Film by mouth 3 (three) times daily.  Marland Kitchen tiZANidine (ZANAFLEX) 4 MG tablet Take 4 mg by mouth every 8 (eight) hours as needed for muscle spasms.  . ziprasidone (GEODON) 40 MG capsule Take 80 mg by mouth 2 (two) times daily with a meal.     FAMILY HISTORY:  Her indicated that her mother is alive. She indicated that her father is alive. She reported the following about her sister: 55.She  indicated that one of her two brothers is deceased. She indicated that her paternal grandmother is deceased. She indicated that the status of her son is unknown. She indicated that her paternal uncle is deceased. She indicated that the status of her other is unknown.    SOCIAL HISTORY: She  reports that she has been smoking E-cigarettes.  She started smoking about 33 years ago. She has a 10.00 pack-year smoking history. She has never used smokeless  tobacco. She reports that she uses drugs, including Marijuana, about 2 times per week. She reports that she does not drink alcohol.  REVIEW OF SYSTEMS:   Not able   SUBJECTIVE:  Sedated on vent   VITAL SIGNS: Temp 99.1 F (37.3 C) (Oral)   HEMODYNAMICS:    VENTILATOR SETTINGS: Vent Mode: PRVC FiO2 (%):  [100 %] 100 % Set Rate:  [16 bmp] 16 bmp Vt Set:  [440 mL] 440 mL PEEP:  [10 cmH20] 10 cmH20  INTAKE / OUTPUT: No intake/output data recorded.  PHYSICAL EXAMINATION: General appearance:  41 Year old  female, well nourished sedated on vent  Eyes: anicteric sclerae, moist conjunctivae; PERRL, EOMI bilaterally. Mouth:  membranes are moist. Orally intubated Neck: Trachea midline; neck supple, no JVD Lungs/chest: prolonged exhalation. Decreased on left. Scattered rhonchi CV: RRR, no MRGs  Abdomen: Soft, non-tender; no masses or HSM Extremities: No peripheral edema or extremity lymphadenopathy Skin: Normal temperature, turgor and texture; no rash, ulcers or subcutaneous nodules Psych: sedated on vent    LABS:  BMET No results for input(s): NA, K, CL, CO2, BUN, CREATININE, GLUCOSE in the last 168 hours.  Electrolytes No results for input(s): CALCIUM, MG, PHOS in the last 168 hours.  CBC No results for input(s): WBC, HGB, HCT, PLT in the last 168 hours.  Coag's No results for input(s): APTT, INR in the last 168 hours.  Sepsis Markers No results for input(s): LATICACIDVEN, PROCALCITON, O2SATVEN in the last 168 hours.  ABG No results for input(s): PHART, PCO2ART, PO2ART in the last 168 hours.  Liver Enzymes No results for input(s): AST, ALT, ALKPHOS, BILITOT, ALBUMIN in the last 168 hours.  Cardiac Enzymes No results for input(s): TROPONINI, PROBNP in the last 168 hours.  Glucose  Recent Labs Lab 10/31/16 1228  GLUCAP 154*    Imaging No results found.   STUDIES:    CULTURES: Sputum 8/31>>>> BCX2 8/31>>> UC 8/31>>> U strep 8/31>>> U legionella  8/31>>> RVP 8/31>>>  ANTIBIOTICS: Rocephin 8/28>> azith 8/28>>>  SIGNIFICANT EVENTS:   LINES/TUBES: OETT 8/30>>>  DISCUSSION: 41 year old female w/ multiple medical problems. [facility] w/ working dx of CAP on 8/28. Developed progressive hypoxic respiratory failure requiring intubation on 8/30 w/ ARDS  ASSESSMENT / PLAN:  PULMONARY A: Acute Hypoxic Respiratory Failure in setting of CAP w/ resultant ARDS H/o "COPD"-->not verified by PFTs but does have sig bronchospasm suggesting some airflow limitations PCXR personally reviewed: ETT good position. Bibasilar airspace disease w/ scattered patchy airspace disease.  P:  Cont full vent support ARDS protocol  Add BDs AND ICS. Might need to consider systemic steroids if wheezing continues CXR and ABG pending  PAD protocol (RAS -2) See ID section   CARDIOVASCULAR A:  Severe sepsis ->currently hemodynamically stable P:  Cont tele Cont IVFs See ID section   RENAL A:   Hypokalemia hyponatremia P:   Repeat bmp (can't tell if K replaced today) Cont MIVFs Strict I&O  GASTROINTESTINAL A:   No acute  P:  PPI for SUP Start tubefeeds  HEMATOLOGIC A:   No acute  ->hgb from [facility]: 11.5 P:  [place] heparin  Am cbc Transfuse per protocol   INFECTIOUS A:   CAP  ->no culture data available from OSH P:   Recheck sputum Pan culture  Cont rocephin and azith (started 8/28) PCT algo  Check urine antigens Check RVP  ENDOCRINE A:   Hyperglycemia  P:   SSI protocol   NEUROLOGIC A:   Acute encephaopathy H/o depression, bipolar disease and ADHD. H/o seizures Chronic pain and Fibromyalgia  P:   RASS goal: -2 Dc versed Cont Diprivan and add fent Cont home meds: lamictal, zyperxa, prozac, and cogentin On geodon at home-->hold for now   FAMILY  - Updates:   - Inter-disciplinary family meet or Palliative Care meeting due by:  9/6  My cct 40 min  Simonne Martinet  ACNP-BC [facility] Pulmonary/Critical Care Pager # [phone] OR # [phone] if no answer \ 10/31/2016, 12:55 PM

## 2016-10-31 NOTE — Progress Notes (Signed)
eLink Physician-Brief Progress Note Patient Name: Latasha [name] N [place] DOB: 03/08/1975 MRN: [id number]   Date of Service  10/31/2016  HPI/Events of Note  Severe ARDS  ,low Tv on PCV  eICU Interventions  Change back to PRVC/ 6cc ARDS protocol/ PEEP 14 Place a -line NMB , goal BIS <60     Intervention Category Major Interventions: Respiratory failure - evaluation and management  ALVA,RAKESH V. 10/31/2016, 10:21 PM

## 2016-10-31 NOTE — Progress Notes (Signed)
50mL of Versed from [facility] wasted in sink witnessed by Vonna KotykMelanie Fowler RN

## 2016-10-31 NOTE — Progress Notes (Addendum)
Pharmacy Antibiotic Note  Latasha [name] is a 41 y.o. female admitted on 10/31/2016 with pneumonia.  Pharmacy has been consulted for ceftriaxone dosing.  [facility] came from [facility], where she was admitted on 10/27/16. She was being treated for community acquired pneumonia and started on IV ceftriaxone and azithromycin. Per chart from OSH, last dose of ceftriaxone and azithromycin on 8/30. Has cephalexin allergy showing nausea/vomiting but has tolerated ceftriaxone in the past.  Plan: Start ceftriaxone 1 g every 24 hours  Monitor relevant cultures, clinical status, and LOT   Weight: 218 lb 4.1 oz (99 kg) (From ED visit 08/2016)  Temp (24hrs), Avg:99.1 F (37.3 C), Min:99.1 F (37.3 C), Max:99.1 F (37.3 C)   Allergies  Allergen Reactions  . Aripiprazole Other (See Comments)    Jerks, Locks up jaws.   . Cephalexin Nausea And Vomiting  . Haldol [Haloperidol Decanoate] Swelling and Other (See Comments)    Locks up jaws  . Tegretol [Carbamazepine] Other (See Comments)    Blisters, Sores  . Zofran Other (See Comments)    Locks up jaw, restlessness   . Ketorolac Tromethamine Rash    Antimicrobials this admission: Ceftriaxone 8/31 >>  Azithromycin 8/31 >>   Dose adjustments this admission: N/A  Microbiology results: 8/31 BCx: pending 8/31 Sputum: pending  8/31 MRSA PCR: pending  Thank you for allowing pharmacy to be a part of this patient's care.  Clarnce FlockKimberly A Perkins 10/31/2016 1:37 PM

## 2016-10-31 NOTE — Progress Notes (Signed)
eLink Physician-Brief Progress Note Patient Name: Latasha [name] N [place] DOB: 03/03/1975 MRN: [id number]   Date of Service  10/31/2016  HPI/Events of Note  Notified by bedside nurse the patient still dyssynchronous with the ventilator. The camera shows breath stacking. Currently on propofol and fentanyl infusions. Patient with eyes closed. Saturation 97-98 percent. Currently on pressure control ventilation.   eICU Interventions  1. Continuing fentanyl and propofol infusions 2. Nurse to administer bolus IV Versed 3. Nurse to administer rocuronium injection following Versed      Intervention Category Major Interventions: Respiratory failure - evaluation and management  [name] 10/31/2016, 7:19 PM

## 2016-10-31 NOTE — Progress Notes (Signed)
Initial Nutrition Assessment  DOCUMENTATION CODES:   Obesity unspecified  INTERVENTION:    Vital High Protein at 20 ml/h (480 ml per day)  Pro-stat 60 ml TID  Provides 1080 kcal, 132 gm protein, 401 ml free water daily  Total intake with Propofol and TF is 1880 kcal ( unable to meet protein needs without exceeding calorie needs)  NUTRITION DIAGNOSIS:   Inadequate oral intake related to inability to eat as evidenced by NPO status.  GOAL:   Provide needs based on ASPEN/SCCM guidelines  MONITOR:   Vent status, TF tolerance, Labs, I & O's  REASON FOR ASSESSMENT:   Consult Enteral/tube feeding initiation and management  ASSESSMENT:    41 yo female with PMH of HLD, ADHD, bipolar D/O, asthma, COPD, seizures, fibromyalgia, PTSD, DDD, stroke who was admitted to [facility] on 8/28 with CAP, required intubation on 8/30, and [facility] with ARDS, CAP, and sepsis.   Received MD Consult for TF initiation and management. Nutrition-Focused physical exam completed. Findings are no fat depletion, no muscle depletion, and no edema.  Patient is currently intubated on ventilator support MV: 12.7 L/min Temp (24hrs), Avg:99.1 F (37.3 C), Min:99.1 F (37.3 C), Max:99.1 F (37.3 C)  Propofol: 30.3 ml/hr providing 800 kcal/day from lipid Labs pending CBG's: 154 Medications reviewed and include propofol.  Diet Order:   NPO  Skin:  Reviewed, no issues  Last BM:  unknown  Height:   Ht Readings from Last 1 Encounters:  10/31/16 5\' 7"  (1.702 m)    Weight:   Wt Readings from Last 1 Encounters:  10/31/16 218 lb 4.1 oz (99 kg)    Ideal Body Weight:  61.4 kg  BMI:  Body mass index is 34.18 kg/m.  Estimated Nutritional Needs:   Kcal:  1090-1390  Protein:  122 gm  Fluid:  1.5+ L  EDUCATION NEEDS:   No education needs identified at this time  Joaquin CourtsKimberly Harris, RD, LDN, CNSC Pager [phone] After Hours Pager [phone]

## 2016-10-31 NOTE — Progress Notes (Signed)
eLink Physician-Brief Progress Note Patient Name: Latasha [name] DOB: 10/24/1975 MRN: [id number]   Date of Service  10/31/2016  HPI/Events of Note  CBG 370 ABG >> severe acidosis -resp + metab  eICU Interventions  Insulin 8 units IV x1, if remains high, then gtt  Increase RR 35 Start bicarb gtt     Intervention Category Major Interventions: Hyperglycemia - active titration of insulin therapy  Margo Lama V. 10/31/2016, 11:25 PM

## 2016-10-31 NOTE — Procedures (Signed)
Arterial Catheter Insertion Procedure Note Frederich Chicklita N Papadopoulos [id number] 03/31/1975  Procedure: Insertion of Arterial Catheter  Indications: Blood pressure monitoring and Frequent blood sampling  Procedure Details Consent: Risks of procedure as well as the alternatives and risks of each were explained to the (patient/caregiver).  Consent for procedure obtained. and Unable to obtain consent because of emergent medical necessity. Time Out: Verified patient identification, verified procedure, site/side was marked, verified correct patient position, special equipment/implants available, medications/allergies/relevent history reviewed, required imaging and test results available.  Performed  Maximum sterile technique was used including antiseptics, cap, gloves, gown, hand hygiene, mask and sheet. Skin prep: Chlorhexidine; local anesthetic administered 20 gauge catheter was inserted into right radial artery using the Seldinger technique.  Evaluation Blood flow good; BP tracing good. Complications: No apparent complications.   Leafy HalfSnider, Aarib Pulido Dale 10/31/2016

## 2016-10-31 NOTE — Progress Notes (Signed)
eLink Physician-Brief Progress Note Patient Name: Latasha [name] DOB: 08/21/1975 MRN: [id number]   Date of Service  10/31/2016  HPI/Events of Note  Notified by bedside nurse that the patient has some flushing of her skin after receiving IV rocuronium. 4 mg of Versed were administered prior to IV rocuronium. Patient maintained on fentanyl and propofol infusions. Camera shows patient with eyes closed. Reportedly patient was responding to stimulus and opening eyes/sitting up in bed prior to IV Versed. Improved ventilator synchrony. Patient is overbreathing the ventilator with set rate of 16 and spontaneous rate of 17. Patient is currently tachycardic to the low 120s. Saturation 91-92 percent.   eICU Interventions  1. Requested the nurse give additional 2 mg IV push Versed  2. Continuing fentanyl and propofol infusions      Intervention Category Major Interventions: Respiratory failure - evaluation and management  [name] 10/31/2016, 8:59 PM

## 2016-11-01 ENCOUNTER — Inpatient Hospital Stay (HOSPITAL_COMMUNITY): Payer: Medicare Other

## 2016-11-01 LAB — BLOOD CULTURE ID PANEL (REFLEXED)
ACINETOBACTER BAUMANNII: NOT DETECTED
CANDIDA ALBICANS: NOT DETECTED
CANDIDA GLABRATA: NOT DETECTED
Candida krusei: NOT DETECTED
Candida parapsilosis: NOT DETECTED
Candida tropicalis: NOT DETECTED
ENTEROBACTER CLOACAE COMPLEX: NOT DETECTED
ENTEROBACTERIACEAE SPECIES: NOT DETECTED
ENTEROCOCCUS SPECIES: NOT DETECTED
Escherichia coli: NOT DETECTED
Haemophilus influenzae: NOT DETECTED
Klebsiella oxytoca: NOT DETECTED
Klebsiella pneumoniae: NOT DETECTED
LISTERIA MONOCYTOGENES: NOT DETECTED
Methicillin resistance: NOT DETECTED
NEISSERIA MENINGITIDIS: NOT DETECTED
Proteus species: NOT DETECTED
Pseudomonas aeruginosa: NOT DETECTED
STAPHYLOCOCCUS SPECIES: DETECTED — AB
STREPTOCOCCUS AGALACTIAE: NOT DETECTED
STREPTOCOCCUS SPECIES: NOT DETECTED
Serratia marcescens: NOT DETECTED
Staphylococcus aureus (BCID): NOT DETECTED
Streptococcus pneumoniae: NOT DETECTED
Streptococcus pyogenes: NOT DETECTED

## 2016-11-01 LAB — CBC
HEMATOCRIT: 33.7 % — AB (ref 36.0–46.0)
Hemoglobin: 11.1 g/dL — ABNORMAL LOW (ref 12.0–15.0)
MCH: 29 pg (ref 26.0–34.0)
MCHC: 32.9 g/dL (ref 30.0–36.0)
MCV: 88 fL (ref 78.0–100.0)
PLATELETS: 193 10*3/uL (ref 150–400)
RBC: 3.83 MIL/uL — ABNORMAL LOW (ref 3.87–5.11)
RDW: 14.3 % (ref 11.5–15.5)
WBC: 12.3 10*3/uL — ABNORMAL HIGH (ref 4.0–10.5)

## 2016-11-01 LAB — BLOOD GAS, ARTERIAL
Acid-Base Excess: 7.9 mmol/L — ABNORMAL HIGH (ref 0.0–2.0)
BICARBONATE: 33.7 mmol/L — AB (ref 20.0–28.0)
Drawn by: 41977
FIO2: 80
LHR: 35 {breaths}/min
MECHVT: 450 mL
O2 Saturation: 96.9 %
PEEP: 14 cmH2O
PO2 ART: 92.3 mmHg (ref 83.0–108.0)
Patient temperature: 98.6
pCO2 arterial: 65.8 mmHg (ref 32.0–48.0)
pH, Arterial: 7.33 — ABNORMAL LOW (ref 7.350–7.450)

## 2016-11-01 LAB — BASIC METABOLIC PANEL
Anion gap: 10 (ref 5–15)
BUN: 18 mg/dL (ref 6–20)
CALCIUM: 8.4 mg/dL — AB (ref 8.9–10.3)
CO2: 31 mmol/L (ref 22–32)
CREATININE: 0.88 mg/dL (ref 0.44–1.00)
Chloride: 97 mmol/L — ABNORMAL LOW (ref 101–111)
GFR calc Af Amer: >60 mL/min (ref >60)
GLUCOSE: 165 mg/dL — AB (ref 65–99)
POTASSIUM: 3 mmol/L — AB (ref 3.5–5.1)
SODIUM: 138 mmol/L (ref 135–145)

## 2016-11-01 LAB — GLUCOSE, CAPILLARY
GLUCOSE-CAPILLARY: 159 mg/dL — AB (ref 65–99)
GLUCOSE-CAPILLARY: 250 mg/dL — AB (ref 65–99)
Glucose-Capillary: 167 mg/dL — ABNORMAL HIGH (ref 65–99)
Glucose-Capillary: 140 mg/dL — ABNORMAL HIGH (ref 65–99)
Glucose-Capillary: 112 mg/dL — ABNORMAL HIGH (ref 65–99)

## 2016-11-01 LAB — LACTIC ACID, PLASMA
LACTIC ACID, VENOUS: 2.7 mmol/L — AB (ref 0.5–1.9)
LACTIC ACID, VENOUS: 2.3 mmol/L — AB (ref 0.5–1.9)

## 2016-11-01 LAB — PHOSPHORUS
PHOSPHORUS: 3 mg/dL (ref 2.5–4.6)
Phosphorus: 4.3 mg/dL (ref 2.5–4.6)

## 2016-11-01 LAB — MAGNESIUM
MAGNESIUM: 2.5 mg/dL — AB (ref 1.7–2.4)
Magnesium: 2.6 mg/dL — ABNORMAL HIGH (ref 1.7–2.4)

## 2016-11-01 LAB — LEGIONELLA PNEUMOPHILA SEROGP 1 UR AG: L. PNEUMOPHILA SEROGP 1 UR AG: NEGATIVE

## 2016-11-01 LAB — POCT I-STAT 3, ART BLOOD GAS (G3+)
ACID-BASE EXCESS: 12 mmol/L — AB (ref 0.0–2.0)
ACID-BASE EXCESS: 2 mmol/L (ref 0.0–2.0)
BICARBONATE: 33 mmol/L — AB (ref 20.0–28.0)
BICARBONATE: 38.5 mmol/L — AB (ref 20.0–28.0)
O2 Saturation: 99 %
O2 Saturation: 99 %
PCO2 ART: 88.9 mmHg — AB (ref 32.0–48.0)
PO2 ART: 131 mmHg — AB (ref 83.0–108.0)
Patient temperature: 98.4
TCO2: 36 mmol/L — ABNORMAL HIGH (ref 22–32)
TCO2: 40 mmol/L — ABNORMAL HIGH (ref 22–32)
pCO2 arterial: 57.1 mmHg — ABNORMAL HIGH (ref 32.0–48.0)
pH, Arterial: 7.176 — CL (ref 7.350–7.450)
pH, Arterial: 7.436 (ref 7.350–7.450)
pO2, Arterial: 173 mmHg — ABNORMAL HIGH (ref 83.0–108.0)

## 2016-11-01 LAB — PROCALCITONIN: Procalcitonin: 1.19 ng/mL

## 2016-11-01 LAB — HIV ANTIBODY (ROUTINE TESTING W REFLEX): HIV Screen 4th Generation wRfx: NONREACTIVE

## 2016-11-01 MED ORDER — POTASSIUM CHLORIDE 20 MEQ/15ML (10%) PO SOLN
40.0000 meq | Freq: Once | ORAL | Status: AC
  Administered 2016-11-01: 40 meq
  Filled 2016-11-01: qty 30

## 2016-11-01 MED ORDER — INSULIN ASPART 100 UNIT/ML ~~LOC~~ SOLN
4.0000 [IU] | SUBCUTANEOUS | Status: DC
  Administered 2016-11-01 – 2016-11-05 (×27): 4 [IU] via SUBCUTANEOUS

## 2016-11-01 MED ORDER — HYDRALAZINE HCL 20 MG/ML IJ SOLN
10.0000 mg | INTRAMUSCULAR | Status: DC | PRN
  Administered 2016-11-01: 10 mg via INTRAVENOUS
  Filled 2016-11-01 (×2): qty 1

## 2016-11-01 MED ORDER — BUDESONIDE 0.5 MG/2ML IN SUSP
0.5000 mg | Freq: Two times a day (BID) | RESPIRATORY_TRACT | Status: DC
  Administered 2016-11-01 – 2016-11-08 (×14): 0.5 mg via RESPIRATORY_TRACT
  Filled 2016-11-01 (×17): qty 2

## 2016-11-01 MED ORDER — LABETALOL HCL 5 MG/ML IV SOLN
10.0000 mg | INTRAVENOUS | Status: DC | PRN
  Administered 2016-11-01: 10 mg via INTRAVENOUS
  Filled 2016-11-01: qty 4

## 2016-11-01 MED ORDER — INSULIN ASPART 100 UNIT/ML ~~LOC~~ SOLN
0.0000 [IU] | SUBCUTANEOUS | Status: DC
  Administered 2016-11-01: 7 [IU] via SUBCUTANEOUS
  Administered 2016-11-01 (×2): 4 [IU] via SUBCUTANEOUS
  Administered 2016-11-01: 3 [IU] via SUBCUTANEOUS
  Administered 2016-11-01: 7 [IU] via SUBCUTANEOUS
  Administered 2016-11-02 – 2016-11-03 (×7): 3 [IU] via SUBCUTANEOUS
  Administered 2016-11-04: 4 [IU] via SUBCUTANEOUS
  Administered 2016-11-04: 3 [IU] via SUBCUTANEOUS
  Administered 2016-11-04: 4 [IU] via SUBCUTANEOUS
  Administered 2016-11-04: 3 [IU] via SUBCUTANEOUS
  Administered 2016-11-05: 4 [IU] via SUBCUTANEOUS
  Administered 2016-11-05 (×3): 3 [IU] via SUBCUTANEOUS

## 2016-11-01 NOTE — Progress Notes (Signed)
eLink Physician-Brief Progress Note Patient Name: Latasha [name] N [place] DOB: 10/23/1975 MRN: [id number]   Date of Service  11/01/2016  HPI/Events of Note  Notified by bedside nurse of hypertension. Patient currently paralyzed and sedated. Heart rate 99. BIS monitor at 20.   eICU Interventions  Hydralazine IV prn SBP >165 ordered     Intervention Category Major Interventions: Hypertension - evaluation and management  [name] 11/01/2016, 8:17 PM

## 2016-11-01 NOTE — Progress Notes (Signed)
Patient with a critical lactic acid level of 2.3. This is lower than previous value.  Asher Muir- Ritika Hellickson,RN

## 2016-11-01 NOTE — Progress Notes (Signed)
PHARMACY - PHYSICIAN COMMUNICATION CRITICAL VALUE ALERT - BLOOD CULTURE IDENTIFICATION (BCID)  Results for orders placed or performed during the hospital encounter of 10/31/16  Blood Culture ID Panel (Reflexed) (Collected: 10/31/2016  1:21 PM)  Result Value Ref Range   Enterococcus species NOT DETECTED NOT DETECTED   Listeria monocytogenes NOT DETECTED NOT DETECTED   Staphylococcus species DETECTED (A) NOT DETECTED   Staphylococcus aureus NOT DETECTED NOT DETECTED   Methicillin resistance NOT DETECTED NOT DETECTED   Streptococcus species NOT DETECTED NOT DETECTED   Streptococcus agalactiae NOT DETECTED NOT DETECTED   Streptococcus pneumoniae NOT DETECTED NOT DETECTED   Streptococcus pyogenes NOT DETECTED NOT DETECTED   Acinetobacter baumannii NOT DETECTED NOT DETECTED   Enterobacteriaceae species NOT DETECTED NOT DETECTED   Enterobacter cloacae complex NOT DETECTED NOT DETECTED   Escherichia coli NOT DETECTED NOT DETECTED   Klebsiella oxytoca NOT DETECTED NOT DETECTED   Klebsiella pneumoniae NOT DETECTED NOT DETECTED   Proteus species NOT DETECTED NOT DETECTED   Serratia marcescens NOT DETECTED NOT DETECTED   Haemophilus influenzae NOT DETECTED NOT DETECTED   Neisseria meningitidis NOT DETECTED NOT DETECTED   Pseudomonas aeruginosa NOT DETECTED NOT DETECTED   Candida albicans NOT DETECTED NOT DETECTED   Candida glabrata NOT DETECTED NOT DETECTED   Candida krusei NOT DETECTED NOT DETECTED   Candida parapsilosis NOT DETECTED NOT DETECTED   Candida tropicalis NOT DETECTED NOT DETECTED    Name of physician (or Provider) Contacted: Dr. Max Fickleouglas McQuaid  Changes to prescribed antibiotics required: None   Vinnie LevelBenjamin Mustafa Potts, PharmD., BCPS Clinical Pharmacist Pager [phone]

## 2016-11-01 NOTE — Progress Notes (Signed)
eLink Physician-Brief Progress Note Patient Name: Latasha [name] DOB: 07/04/1975 MRN: [id number]   Date of Service  11/01/2016  HPI/Events of Note  Transient effectiveness of hypertension with use of hydralazine. BIS monitor remains at 22.   eICU Interventions  Labetalol IV when necessary ordered      Intervention Category Major Interventions: Hypertension - evaluation and management  Lawanda CousinsJennings Jomarion Mish 11/01/2016, 10:04 PM

## 2016-11-01 NOTE — Progress Notes (Signed)
PULMONARY / CRITICAL CARE MEDICINE   Name: Latasha [name] MRN: [id number] DOB: 05/13/1975     ADMISSION DATE:  10/31/2016 CONSULTATION DATE:  10/31/2016  REFERRING MD:  OSH MD  CHIEF COMPLAINT:  Respiratory failure  BRIEF: 41 y/o smoker with bipolar disorder who was admitted to an OSH with severe CAP on 8/28, required intubation on 8/30 and was transferred to our facility.     SUBJECTIVE:  Placed on paralytics for worsening dyssynchrony overnight, hypercarbia developed, had RR increased, hypoxemia worsened  VITAL SIGNS: BP 108/74 (BP Location: Right Arm)   Pulse 71   Temp (!) 97.4 F (36.3 C) (Oral)   Resp (!) 35   Ht 5\' 7"  (1.702 m)   Wt 100.6 kg (221 lb 12.5 oz)   SpO2 97%   BMI 34.74 kg/m   HEMODYNAMICS: CVP:  [18 mmHg] 18 mmHg  VENTILATOR SETTINGS: Vent Mode: PRVC FiO2 (%):  [70 %-100 %] 70 % Set Rate:  [16 bmp-35 bmp] 35 bmp Vt Set:  [370 mL-450 mL] 450 mL PEEP:  [10 cmH20-14 cmH20] 14 cmH20 Pressure Support:  [7 cmH20] 7 cmH20 Plateau Pressure:  [31 cmH20-34 cmH20] 31 cmH20  INTAKE / OUTPUT: I/O last 3 completed shifts: In: 2196.9 [I.V.:1791.2; NG/GT:355.7; IV Piggyback:50] Out: 1155 [Urine:1130; Emesis/NG output:25]  PHYSICAL EXAMINATION:   General:  In bed on vent HENT: NCAT ETT in place PULM: CTA B, vent supported breathing CV: RRR, no mgr GI: BS+, soft, nontender MSK: normal bulk and tone Neuro: sedated, paralyzed on vent    LABS:  BMET  Recent Labs Lab 11/01/16 0306  NA 138  K 3.0*  CL 97*  CO2 31  BUN 18  CREATININE 0.88  GLUCOSE 165*    Electrolytes  Recent Labs Lab 10/31/16 1636 11/01/16 0306  CALCIUM  --  8.4*  MG 2.4 2.5*  PHOS 3.7 3.0    CBC  Recent Labs Lab 11/01/16 0306  WBC 12.3*  HGB 11.1*  HCT 33.7*  PLT 193    Coag's No results for input(s): APTT, INR in the last 168 hours.  Sepsis Markers  Recent Labs Lab 10/31/16 1636 11/01/16 0306 11/01/16 0307  LATICACIDVEN 1.5  --  2.7*  PROCALCITON  0.16 1.19  --     ABG  Recent Labs Lab 10/31/16 2345 11/01/16 0013 11/01/16 0410  PHART 6.915* 7.176* 7.330*  PCO2ART VALUE ABOVE RANGE 88.9* 65.8*  PO2ART 137* 173.0* 92.3    Liver Enzymes No results for input(s): AST, ALT, ALKPHOS, BILITOT, ALBUMIN in the last 168 hours.  Cardiac Enzymes No results for input(s): TROPONINI, PROBNP in the last 168 hours.  Glucose  Recent Labs Lab 10/31/16 1228 10/31/16 1647 10/31/16 1947 10/31/16 2318 11/01/16 0312 11/01/16 0754  GLUCAP 154* 194* 203* 378* 159* 167*    Imaging Dg Chest Port 1 View  Result Date: 11/01/2016 CLINICAL DATA:  Ventilator dependent. EXAM: PORTABLE CHEST 1 VIEW COMPARISON:  One-view chest x-ray 10/31/2016 FINDINGS: Endotracheal tube, right IJ line, and NG tube are stable. The heart size is normal. Aeration is slightly improved. Bibasilar airspace disease and effusions are again noted. Mild pulmonary vascular congestion is improved. IMPRESSION: 1. Improving aeration in decreased pulmonary vascular congestion. 2. Bilateral effusions and lower lobe airspace disease is also improved. This likely reflects atelectasis. Airspace disease is not excluded. 3. The support apparatus is stable. Electronically Signed   By: Marin Robertshristopher  Mattern M.D.   On: 11/01/2016 08:06   Dg Chest Port 1 View  Result Date: 10/31/2016 CLINICAL DATA:  Central line placement. EXAM: PORTABLE CHEST 1 VIEW COMPARISON:  Earlier today. FINDINGS: Interval right jugular catheter with its tip in the superior vena cava. No pneumothorax. Endotracheal tube in satisfactory position. Nasogastric tube tip and side hole in the proximal stomach. The cardiac silhouette remains borderline enlarged. No significant change in diffuse prominence of the pulmonary vasculature and interstitial markings and bibasilar linear and patchy opacities. IMPRESSION: 1. Right jugular catheter tip in the superior vena cava without pneumothorax. 2. No significant change in borderline  cardiomegaly and changes of congestive heart failure with bibasilar atelectasis and possible alveolar edema or pneumonia. Electronically Signed   By: Beckie Salts M.D.   On: 10/31/2016 14:54   Dg Chest Port 1 View  Result Date: 10/31/2016 CLINICAL DATA:  Confirm ET and orogastric tube placement. EXAM: PORTABLE CHEST 1 VIEW COMPARISON:  Chest x-ray from same day. FINDINGS: The endotracheal and enteric tubes are unchanged in position. Cardiomegaly with increased pulmonary vascular congestion, interstitial edema, and bibasilar opacities. Small bilateral pleural effusions. No pneumothorax. IMPRESSION: 1. Stable lines and tubes. 2. No change in lower lobe predominant bilateral pulmonary opacities and edema. Electronically Signed   By: Obie Dredge M.D.   On: 10/31/2016 13:26     CULTURES: Sputum 8/31>>>> BCX2 8/31>>> GPC in 1/2 UC 8/31>>> U strep 8/31>>> U legionella 8/31>>> RVP 8/31>>> negative  ANTIBIOTICS: Rocephin 8/28>> Azith 8/28>>>  SIGNIFICANT EVENTS:   LINES/TUBES: OETT 8/30>>> R IJ CVL 8/31 >   DISCUSSION: 41 y/o female with a past medical history of tobacco abuse and COPD admitted for severe CAP with acute respiratory failure with hypoxemia.  Developed worsening hypercarbia and vent dyssynchrony on 8/31 requiring paralytics.    ASSESSMENT / PLAN:  PULMONARY A: Severe acute respiratory failure with hypoxemia but not ARDS as she doesn't have diffuse bilateral airspace disease; favor severe air trapping, bilateral consolidation and atelectasis Presumably COPD, exacerbation Severe CAP Severe respiratory acidosis overnight P:   Continue ARDS vent settings for now, decrease RR to 30 to allow for air trapping/airflow obstruction Stop steroids Continue paralytics for now Repeat ABG  Continue brovana   CARDIOVASCULAR A:  No acute issues P:  Tele Monitor BP  RENAL A:   Severe respiratory acidosis improving Hypokalemia P:   Monitor BMET and UOP Replace  electrolytes as needed Stop sodium bicarbonate drip  GASTROINTESTINAL A:   No acute issues P:   Continue tube feeding Continue PPI  HEMATOLOGIC A:   Anemia without bleeding P:  Monitor for bleeding  INFECTIOUS A:   Severe CAP P:   Continue ceftriaxone and azithromycin F/u respiratory culture  ENDOCRINE A:   Hyperglycemia   P:   Continue SSI  NEUROLOGIC A:   Sedation needs for severe vent dyssychrony Baseline Bipolar P:   RASS goal: -5 Paralytic protocol for ventilated patients   FAMILY  - Updates: none bedside  - Inter-disciplinary family meet or Palliative Care meeting due by:  day 7  My cc time 40 minutes  Heber [place], MD [place] [facility] Pager: [phone] Cell: [phone] After 3pm or if no response, call [phone]    11/01/2016, 11:37 AM

## 2016-11-01 NOTE — Progress Notes (Signed)
In pt demographics it says "HIPPA- restrictions no info given to pts mother".  Brought this up when pts mom and 3 sisters came to vist this afternoon. Per all of them its an old info from couple of years back when she was doing drugs, and all family agreed to pass information to mother, also mom is the primary contact. Johny,RN  .

## 2016-11-01 NOTE — Progress Notes (Signed)
eLink Physician-Brief Progress Note Patient Name: Latasha [name] DOB: 01/21/1976 MRN: [id number]   Date of Service  11/01/2016  HPI/Events of Note    eICU Interventions  Hypokalemia -repleted      Intervention Category Intermediate Interventions: Electrolyte abnormality - evaluation and management  Latasha [name] V. 11/01/2016, 3:47 AM

## 2016-11-02 ENCOUNTER — Inpatient Hospital Stay (HOSPITAL_COMMUNITY): Payer: Medicare Other

## 2016-11-02 LAB — TRIGLYCERIDES
TRIGLYCERIDES: 244 mg/dL — AB (ref <150)
Triglycerides: 277 mg/dL — ABNORMAL HIGH (ref <150)

## 2016-11-02 LAB — CULTURE, RESPIRATORY W GRAM STAIN: Culture: NO GROWTH

## 2016-11-02 LAB — GLUCOSE, CAPILLARY
GLUCOSE-CAPILLARY: 117 mg/dL — AB (ref 65–99)
GLUCOSE-CAPILLARY: 132 mg/dL — AB (ref 65–99)
GLUCOSE-CAPILLARY: 142 mg/dL — AB (ref 65–99)
Glucose-Capillary: 104 mg/dL — ABNORMAL HIGH (ref 65–99)
Glucose-Capillary: 115 mg/dL — ABNORMAL HIGH (ref 65–99)
Glucose-Capillary: 118 mg/dL — ABNORMAL HIGH (ref 65–99)
Glucose-Capillary: 123 mg/dL — ABNORMAL HIGH (ref 65–99)

## 2016-11-02 LAB — CBC WITH DIFFERENTIAL/PLATELET
BASOS ABS: 0 10*3/uL (ref 0.0–0.1)
BASOS PCT: 0 %
EOS PCT: 3 %
Eosinophils Absolute: 0.4 10*3/uL (ref 0.0–0.7)
HEMATOCRIT: 34.9 % — AB (ref 36.0–46.0)
Hemoglobin: 11.4 g/dL — ABNORMAL LOW (ref 12.0–15.0)
Lymphocytes Relative: 24 %
Lymphs Abs: 3.1 10*3/uL (ref 0.7–4.0)
MCH: 28.7 pg (ref 26.0–34.0)
MCHC: 32.7 g/dL (ref 30.0–36.0)
MCV: 87.9 fL (ref 78.0–100.0)
MONO ABS: 0.5 10*3/uL (ref 0.1–1.0)
MONOS PCT: 4 %
NEUTROS ABS: 8.8 10*3/uL — AB (ref 1.7–7.7)
Neutrophils Relative %: 69 %
Platelets: 199 10*3/uL (ref 150–400)
RBC: 3.97 MIL/uL (ref 3.87–5.11)
RDW: 14.7 % (ref 11.5–15.5)
WBC: 12.8 10*3/uL — ABNORMAL HIGH (ref 4.0–10.5)

## 2016-11-02 LAB — POCT I-STAT 3, ART BLOOD GAS (G3+)
ACID-BASE EXCESS: 14 mmol/L — AB (ref 0.0–2.0)
Bicarbonate: 40.8 mmol/L — ABNORMAL HIGH (ref 20.0–28.0)
O2 SAT: 96 %
PH ART: 7.418 (ref 7.350–7.450)
PO2 ART: 79 mmHg — AB (ref 83.0–108.0)
Patient temperature: 97.6
TCO2: 43 mmol/L — AB (ref 22–32)
pCO2 arterial: 62.9 mmHg — ABNORMAL HIGH (ref 32.0–48.0)

## 2016-11-02 LAB — BASIC METABOLIC PANEL
ANION GAP: 9 (ref 5–15)
BUN: 14 mg/dL (ref 6–20)
CALCIUM: 8.9 mg/dL (ref 8.9–10.3)
CO2: 35 mmol/L — AB (ref 22–32)
Chloride: 98 mmol/L — ABNORMAL LOW (ref 101–111)
Creatinine, Ser: 0.65 mg/dL (ref 0.44–1.00)
GFR calc Af Amer: >60 mL/min (ref >60)
GLUCOSE: 148 mg/dL — AB (ref 65–99)
Potassium: 3 mmol/L — ABNORMAL LOW (ref 3.5–5.1)
Sodium: 142 mmol/L (ref 135–145)

## 2016-11-02 LAB — CULTURE, RESPIRATORY: GRAM STAIN: NONE SEEN

## 2016-11-02 LAB — PROCALCITONIN: PROCALCITONIN: 1.94 ng/mL

## 2016-11-02 MED ORDER — HEPARIN SODIUM (PORCINE) 5000 UNIT/ML IJ SOLN
5000.0000 [IU] | Freq: Three times a day (TID) | INTRAMUSCULAR | Status: DC
  Administered 2016-11-02 – 2016-11-08 (×18): 5000 [IU] via SUBCUTANEOUS
  Filled 2016-11-02 (×19): qty 1

## 2016-11-02 MED ORDER — ORAL CARE MOUTH RINSE
15.0000 mL | Freq: Four times a day (QID) | OROMUCOSAL | Status: DC
  Administered 2016-11-02 (×2): 15 mL via OROMUCOSAL

## 2016-11-02 MED ORDER — CHLORHEXIDINE GLUCONATE 0.12% ORAL RINSE (MEDLINE KIT)
15.0000 mL | Freq: Two times a day (BID) | OROMUCOSAL | Status: DC
  Administered 2016-11-02: 15 mL via OROMUCOSAL

## 2016-11-02 MED ORDER — SENNOSIDES 8.8 MG/5ML PO SYRP
5.0000 mL | ORAL_SOLUTION | Freq: Every day | ORAL | Status: DC
  Administered 2016-11-02 – 2016-11-04 (×3): 5 mL
  Filled 2016-11-02 (×4): qty 5

## 2016-11-02 MED ORDER — POLYETHYLENE GLYCOL 3350 17 G PO PACK
17.0000 g | PACK | Freq: Every day | ORAL | Status: DC
  Administered 2016-11-02 – 2016-11-04 (×3): 17 g
  Filled 2016-11-02 (×4): qty 1

## 2016-11-02 MED ORDER — FENTANYL 2500MCG IN NS 250ML (10MCG/ML) PREMIX INFUSION
25.0000 ug/h | INTRAVENOUS | Status: DC
  Administered 2016-11-02 (×2): 350 ug/h via INTRAVENOUS
  Administered 2016-11-03: 40 ug/h via INTRAVENOUS
  Administered 2016-11-03: 400 ug/h via INTRAVENOUS
  Administered 2016-11-03: 200 ug/h via INTRAVENOUS
  Administered 2016-11-04: 225 ug/h via INTRAVENOUS
  Filled 2016-11-02 (×8): qty 250

## 2016-11-02 MED ORDER — FENTANYL BOLUS VIA INFUSION
50.0000 ug | INTRAVENOUS | Status: DC | PRN
  Administered 2016-11-04 (×2): 50 ug via INTRAVENOUS
  Filled 2016-11-02: qty 50

## 2016-11-02 MED ORDER — FUROSEMIDE 10 MG/ML IJ SOLN
40.0000 mg | Freq: Four times a day (QID) | INTRAMUSCULAR | Status: AC
  Administered 2016-11-02 (×2): 40 mg via INTRAVENOUS
  Filled 2016-11-02 (×2): qty 4

## 2016-11-02 MED ORDER — FENTANYL CITRATE (PF) 100 MCG/2ML IJ SOLN
50.0000 ug | Freq: Once | INTRAMUSCULAR | Status: AC
  Administered 2016-11-02: 50 ug via INTRAVENOUS

## 2016-11-02 MED ORDER — FUROSEMIDE 10 MG/ML IJ SOLN
20.0000 mg | Freq: Once | INTRAMUSCULAR | Status: AC
  Administered 2016-11-02: 20 mg via INTRAVENOUS
  Filled 2016-11-02: qty 2

## 2016-11-02 MED ORDER — POTASSIUM CHLORIDE 20 MEQ/15ML (10%) PO SOLN
40.0000 meq | Freq: Two times a day (BID) | ORAL | Status: AC
  Administered 2016-11-02 – 2016-11-03 (×3): 40 meq
  Filled 2016-11-02 (×3): qty 30

## 2016-11-02 MED ORDER — PROPOFOL 1000 MG/100ML IV EMUL
0.0000 ug/kg/min | INTRAVENOUS | Status: DC
  Administered 2016-11-02: 40 ug/kg/min via INTRAVENOUS
  Administered 2016-11-02: 80 ug/kg/min via INTRAVENOUS
  Administered 2016-11-02: 60 ug/kg/min via INTRAVENOUS
  Administered 2016-11-02: 80 ug/kg/min via INTRAVENOUS
  Administered 2016-11-03: 60 ug/kg/min via INTRAVENOUS
  Administered 2016-11-03 (×2): 80 ug/kg/min via INTRAVENOUS
  Administered 2016-11-03: 50 ug/kg/min via INTRAVENOUS
  Administered 2016-11-03: 80 ug/kg/min via INTRAVENOUS
  Administered 2016-11-03: 70 ug/kg/min via INTRAVENOUS
  Administered 2016-11-03: 80 ug/kg/min via INTRAVENOUS
  Administered 2016-11-03: 60 ug/kg/min via INTRAVENOUS
  Administered 2016-11-04: 70 ug/kg/min via INTRAVENOUS
  Administered 2016-11-04: 60 ug/kg/min via INTRAVENOUS
  Administered 2016-11-04: 70 ug/kg/min via INTRAVENOUS
  Filled 2016-11-02 (×17): qty 100

## 2016-11-02 MED ORDER — POTASSIUM CHLORIDE 20 MEQ/15ML (10%) PO SOLN
30.0000 meq | ORAL | Status: AC
  Administered 2016-11-02 (×2): 30 meq
  Filled 2016-11-02 (×2): qty 30

## 2016-11-02 NOTE — Progress Notes (Signed)
[facility] FOR [facility] LAB REPLACEMENT ONLY  The [facility] based on the criteria listed below:   1. Is GFR >/= 40 ml/min? Yes.    Patient's GFR today is >60 2. Is urine output >/= 0.5 ml/kg/hr for the last 6 hours? Yes.  Patient's UOP is 0.5 ml/kg/hr 3. Is BUN < 60 mg/dL? Yes.    Patient's BUN today is 14 4. Abnormal electrolyte(s): K+3.0 5. Ordered repletion with: Protocol 6. If a panic level lab has been reported, has the CCM MD in charge been notified? No..   Physician:  Tandy Gawlva  Bitania Shankland Hilliard 11/02/2016 5:37 AM

## 2016-11-02 NOTE — Progress Notes (Signed)
eLink Physician-Brief Progress Note Patient Name: Latasha [name] DOB: 05/05/1975 MRN: [id number]   Date of Service  11/02/2016  HPI/Events of Note  ARDS Hypertensive CVP high  eICU Interventions  Lasix 20 x 1     Intervention Category Intermediate Interventions: Other:  ALVA,RAKESH V. 11/02/2016, 2:48 AM

## 2016-11-02 NOTE — Progress Notes (Signed)
PULMONARY / CRITICAL CARE MEDICINE   Name: JAIRA CANADY MRN: [id number] DOB: [birth year]     ADMISSION DATE:  10/31/2016 CONSULTATION DATE:  10/31/2016  REFERRING MD:  OSH MD  CHIEF COMPLAINT:  Respiratory failure  BRIEF: 41 y/o smoker with bipolar disorder who was admitted to an OSH with severe CAP on 8/28, required intubation on 8/30 and was transferred to our facility.     SUBJECTIVE:  Oxygenation improving Hypertension overnight Remains paralyzed  VITAL SIGNS: BP (!) 103/59 (BP Location: Left Arm)   Pulse 97   Temp 98.2 F (36.8 C) (Oral)   Resp (!) 35   Ht 5\' 7"  (1.702 m)   Wt 104.1 kg (229 lb 8 oz)   SpO2 (!) 88%   BMI 35.94 kg/m   HEMODYNAMICS:    VENTILATOR SETTINGS: Vent Mode: PRVC FiO2 (%):  [40 %-70 %] 40 % Set Rate:  [35 bmp-36 bmp] 35 bmp Vt Set:  [370 mL-450 mL] 370 mL PEEP:  [10 cmH20-12 cmH20] 10 cmH20 Plateau Pressure:  [21 cmH20-32 cmH20] 31 cmH20  INTAKE / OUTPUT: I/O last 3 completed shifts: In: 4772.4 [I.V.:4192.4; NG/GT:580] Out: 3355 [Urine:3355]  PHYSICAL EXAMINATION:   General:  In bed on vent HENT: NCAT ETT in place PULM: Wheezing bilaterally B, vent supported breathing CV: RRR, no mgr GI: BS+, soft, nontender MSK: normal bulk and tone Neuro: sedated, paralyzed on vent       LABS:  BMET  Recent Labs Lab 11/01/16 0306 11/02/16 0430  NA 138 142  K 3.0* 3.0*  CL 97* 98*  CO2 31 35*  BUN 18 14  CREATININE 0.88 0.65  GLUCOSE 165* 148*    Electrolytes  Recent Labs Lab 10/31/16 1636 11/01/16 0306 11/01/16 1850 11/02/16 0430  CALCIUM  --  8.4*  --  8.9  MG 2.4 2.5* 2.6*  --   PHOS 3.7 3.0 4.3  --     CBC  Recent Labs Lab 11/01/16 0306 11/02/16 0430  WBC 12.3* 12.8*  HGB 11.1* 11.4*  HCT 33.7* 34.9*  PLT 193 199    Coag's No results for input(s): APTT, INR in the last 168 hours.  Sepsis Markers  Recent Labs Lab 10/31/16 1636 11/01/16 0306 11/01/16 0307 11/01/16 1200 11/02/16 0430   LATICACIDVEN 1.5  --  2.7* 2.3*  --   PROCALCITON 0.16 1.19  --   --  1.94    ABG  Recent Labs Lab 11/01/16 0013 11/01/16 0410 11/01/16 1411  PHART 7.176* 7.330* 7.436  PCO2ART 88.9* 65.8* 57.1*  PO2ART 173.0* 92.3 131.0*    Liver Enzymes No results for input(s): AST, ALT, ALKPHOS, BILITOT, ALBUMIN in the last 168 hours.  Cardiac Enzymes No results for input(s): TROPONINI, PROBNP in the last 168 hours.  Glucose  Recent Labs Lab 11/01/16 1141 11/01/16 1530 11/01/16 2027 11/02/16 0010 11/02/16 0411 11/02/16 0808  GLUCAP 250* 140* 112* 118* 132* 142*    Imaging Dg Chest Port 1 View  Result Date: 11/02/2016 CLINICAL DATA:  Acute respiratory failure. EXAM: PORTABLE CHEST 1 VIEW COMPARISON:  One-view chest x-ray 11/01/2016 FINDINGS: The patient is slightly rotated to the left. Endotracheal tube and right IJ line are stable. The NG tube courses off the inferior border of the film. Heart size is exaggerated by low lung volumes. Mild pulmonary vascular congestion is again noted. Bilateral pleural effusions and basilar airspace disease is unchanged. IMPRESSION: 1. Slight increase in pulmonary vascular congestion. 2. Stable bilateral pleural effusions and lower lobe airspace disease. While this  likely reflects atelectasis, infection is not excluded. 3. The support apparatus is stable. Electronically Signed   By: Marin Robertshristopher  Mattern M.D.   On: 11/02/2016 07:12     CULTURES: Sputum 8/31>>>> BCX2 8/31>>> GPC in 1/2 UC 8/31>>> U strep 8/31>>> U legionella 8/31>>> RVP 8/31>>> negative  ANTIBIOTICS: Rocephin 8/28>> Azith 8/28>>>  SIGNIFICANT EVENTS:   LINES/TUBES: OETT 8/30>>> R IJ CVL 8/31 >   DISCUSSION: 41 y/o female with a past medical history of tobacco abuse and COPD admitted for severe CAP with acute respiratory failure with hypoxemia.  Developed worsening hypercarbia and vent dyssynchrony on 8/31 requiring paralytics.    ASSESSMENT /  PLAN:  PULMONARY A: Severe acute respiratory failure with hypoxemia but not ARDS as she doesn't have diffuse bilateral airspace disease; favor severe air trapping, bilateral consolidation and atelectasis Presumably COPD, exacerbation Severe CAP Severe respiratory acidosis overnight P:   Stop paralytics Wean FiO2/PEEP Continue heavy sedation, see neuro Continue bronchodilators Lasix today  CARDIOVASCULAR A:  No acute issues P:  Tele  Monitor BP  RENAL A:   Severe respiratory acidosis improving Hypokalemia P:   Monitor BMET and UOP Replace electrolytes as needed   GASTROINTESTINAL A:   No acute issues P:   Continue tube feeding Continue protonix  HEMATOLOGIC A:   Anemia without bleeding P:  Monitor for bleedign  INFECTIOUS A:   Severe CAP Coag neg staph blood cultures positive 1/2 P:   Continue ceftriaxone and azithromycin F/u culture  ENDOCRINE A:   Hyperglycemia   P:   Continue SSI  NEUROLOGIC A:   Sedation needs for severe vent dyssychrony Baseline Bipolar P:   Stop paralytics RASS goal -3 to -4 PAD protocol  FAMILY  - Updates: updated sister at bedside on 9/1  - Inter-disciplinary family meet or Palliative Care meeting due by:  day 7  My cc time 33 minutes  Heber CarolinaBrent McQuaid, MD [place] [facility] Pager: [phone] Cell: [phone] After 3pm or if no response, call [phone]    11/02/2016, 11:07 AM

## 2016-11-03 ENCOUNTER — Inpatient Hospital Stay (HOSPITAL_COMMUNITY): Payer: Medicare Other

## 2016-11-03 DIAGNOSIS — J9601 Acute respiratory failure with hypoxia: Secondary | ICD-10-CM

## 2016-11-03 DIAGNOSIS — J189 Pneumonia, unspecified organism: Secondary | ICD-10-CM

## 2016-11-03 LAB — GLUCOSE, CAPILLARY
GLUCOSE-CAPILLARY: 139 mg/dL — AB (ref 65–99)
GLUCOSE-CAPILLARY: 128 mg/dL — AB (ref 65–99)
Glucose-Capillary: 104 mg/dL — ABNORMAL HIGH (ref 65–99)
Glucose-Capillary: 116 mg/dL — ABNORMAL HIGH (ref 65–99)
Glucose-Capillary: 123 mg/dL — ABNORMAL HIGH (ref 65–99)
Glucose-Capillary: 128 mg/dL — ABNORMAL HIGH (ref 65–99)

## 2016-11-03 LAB — CBC WITH DIFFERENTIAL/PLATELET
Basophils Absolute: 0 10*3/uL (ref 0.0–0.1)
Basophils Relative: 0 %
EOS PCT: 6 %
Eosinophils Absolute: 0.6 10*3/uL (ref 0.0–0.7)
HCT: 34.6 % — ABNORMAL LOW (ref 36.0–46.0)
HEMOGLOBIN: 10.7 g/dL — AB (ref 12.0–15.0)
LYMPHS ABS: 2.8 10*3/uL (ref 0.7–4.0)
LYMPHS PCT: 31 %
MCH: 28.6 pg (ref 26.0–34.0)
MCHC: 30.9 g/dL (ref 30.0–36.0)
MCV: 92.5 fL (ref 78.0–100.0)
Monocytes Absolute: 0.3 10*3/uL (ref 0.1–1.0)
Monocytes Relative: 4 %
NEUTROS PCT: 59 %
Neutro Abs: 5.2 10*3/uL (ref 1.7–7.7)
Platelets: 193 10*3/uL (ref 150–400)
RBC: 3.74 MIL/uL — AB (ref 3.87–5.11)
RDW: 15.4 % (ref 11.5–15.5)
WBC: 8.9 10*3/uL (ref 4.0–10.5)

## 2016-11-03 LAB — BASIC METABOLIC PANEL
Anion gap: 9 (ref 5–15)
BUN: 20 mg/dL (ref 6–20)
CHLORIDE: 98 mmol/L — AB (ref 101–111)
CO2: 37 mmol/L — ABNORMAL HIGH (ref 22–32)
Calcium: 8.9 mg/dL (ref 8.9–10.3)
Creatinine, Ser: 0.66 mg/dL (ref 0.44–1.00)
GFR calc Af Amer: >60 mL/min (ref >60)
GFR calc non Af Amer: >60 mL/min (ref >60)
Glucose, Bld: 111 mg/dL — ABNORMAL HIGH (ref 65–99)
Potassium: 3.5 mmol/L (ref 3.5–5.1)
SODIUM: 144 mmol/L (ref 135–145)

## 2016-11-03 LAB — CULTURE, BLOOD (ROUTINE X 2): Special Requests: ADEQUATE

## 2016-11-03 MED ORDER — CHLORHEXIDINE GLUCONATE CLOTH 2 % EX PADS
6.0000 | MEDICATED_PAD | Freq: Every day | CUTANEOUS | Status: DC
  Administered 2016-11-04 – 2016-11-08 (×5): 6 via TOPICAL

## 2016-11-03 MED ORDER — FUROSEMIDE 10 MG/ML IJ SOLN
40.0000 mg | Freq: Four times a day (QID) | INTRAMUSCULAR | Status: AC
  Administered 2016-11-03 (×2): 40 mg via INTRAVENOUS
  Filled 2016-11-03 (×2): qty 4

## 2016-11-03 MED ORDER — POTASSIUM CHLORIDE 20 MEQ/15ML (10%) PO SOLN
40.0000 meq | Freq: Two times a day (BID) | ORAL | Status: AC
  Administered 2016-11-03 – 2016-11-04 (×3): 40 meq
  Filled 2016-11-03 (×3): qty 30

## 2016-11-03 NOTE — Progress Notes (Signed)
eLink Physician-Brief Progress Note Patient Name: Latasha [name] DOB: 04/13/1975 MRN: [id number]   Date of Service  11/03/2016  HPI/Events of Note  K+ = 3.5 and Creatinine = 0.66.  eICU Interventions  K+ replacement already ordered.      Intervention Category Major Interventions: Electrolyte abnormality - evaluation and management  Chamaine Stankus Eugene 11/03/2016, 6:14 AM

## 2016-11-03 NOTE — Progress Notes (Signed)
PULMONARY / CRITICAL CARE MEDICINE   Name: Latasha [name] MRN: [id number] DOB: 1975/09/09     ADMISSION DATE:  10/31/2016 CONSULTATION DATE:  10/31/2016  REFERRING MD:  OSH MD  CHIEF COMPLAINT:  Respiratory failure  BRIEF: 42 y/o smoker with bipolar disorder who was admitted to an OSH with severe CAP on 8/28, required intubation on 8/30 and was transferred to our facility.     SUBJECTIVE:  No sig issues over night.  Weaning PEEP/FIO2  VITAL SIGNS: BP 101/64   Pulse 76   Temp 98.3 F (36.8 C) (Oral)   Resp 14   Ht 5\' 7"  (1.702 m)   Wt 227 lb 8.2 oz (103.2 kg)   SpO2 91%   BMI 35.63 kg/m   HEMODYNAMICS:    VENTILATOR SETTINGS: Vent Mode: PRVC FiO2 (%):  [50 %-60 %] 50 % Set Rate:  [25 bmp-35 bmp] 25 bmp Vt Set:  [370 mL] 370 mL PEEP:  [8 cmH20-10 cmH20] 8 cmH20 Plateau Pressure:  [17 cmH20-30 cmH20] 23 cmH20  INTAKE / OUTPUT:  Intake/Output Summary (Last 24 hours) at 11/03/16 1100 Last data filed at 11/03/16 1000  Gross per 24 hour  Intake          3182.13 ml  Output             3120 ml  Net            62.13 ml     PHYSICAL EXAMINATION:  General appearance:  41 Year old  Female,obese, heavily sedated on vent.  Eyes: anicteric sclerae , moist conjunctivae; PERRL, EOMI bilaterally. Mouth:  membranes and no mucosal ulcerations; normal hard and soft palate, orally intubated Neck: Trachea midline; neck supple, no JVD, Right IJ is unremarkable Lungs/chest: Faint expiratory wheeze right base, with normal respiratory effort and no intercostal retractions CV: RRR, no MRGs  Abdomen: Soft, non-tender; no masses or HSM Extremities: No peripheral edema or extremity lymphadenopathy Skin: Normal temperature, turgor and texture; no rash, ulcers or subcutaneous nodules Psych:sedated on vent   LABS:  BMET  Recent Labs Lab 11/01/16 0306 11/02/16 0430 11/03/16 0530  NA 138 142 144  K 3.0* 3.0* 3.5  CL 97* 98* 98*  CO2 31 35* 37*  BUN 18 14 20   CREATININE 0.88  0.65 0.66  GLUCOSE 165* 148* 111*    Electrolytes  Recent Labs Lab 10/31/16 1636 11/01/16 0306 11/01/16 1850 11/02/16 0430 11/03/16 0530  CALCIUM  --  8.4*  --  8.9 8.9  MG 2.4 2.5* 2.6*  --   --   PHOS 3.7 3.0 4.3  --   --     CBC  Recent Labs Lab 11/01/16 0306 11/02/16 0430 11/03/16 0530  WBC 12.3* 12.8* 8.9  HGB 11.1* 11.4* 10.7*  HCT 33.7* 34.9* 34.6*  PLT 193 199 193    Coag's No results for input(s): APTT, INR in the last 168 hours.  Sepsis Markers  Recent Labs Lab 10/31/16 1636 11/01/16 0306 11/01/16 0307 11/01/16 1200 11/02/16 0430  LATICACIDVEN 1.5  --  2.7* 2.3*  --   PROCALCITON 0.16 1.19  --   --  1.94    ABG  Recent Labs Lab 11/01/16 0410 11/01/16 1411 11/02/16 1304  PHART 7.330* 7.436 7.418  PCO2ART 65.8* 57.1* 62.9*  PO2ART 92.3 131.0* 79.0*    Liver Enzymes No results for input(s): AST, ALT, ALKPHOS, BILITOT, ALBUMIN in the last 168 hours.  Cardiac Enzymes No results for input(s): TROPONINI, PROBNP in the last 168 hours.  Glucose  Recent Labs Lab 11/02/16 1138 11/02/16 1607 11/02/16 2007 11/02/16 2358 11/03/16 0403 11/03/16 0732  GLUCAP 123* 104* 115* 117* 123* 128*    Imaging Dg Chest Port 1 View  Result Date: 11/03/2016 CLINICAL DATA:  Acute respiratory failure. EXAM: PORTABLE CHEST 1 VIEW COMPARISON:  One-view chest x-ray 11/02/2016 FINDINGS: Endotracheal tube and the right IJ line are stable. NG tube courses off the inferior border the film. Heart size is exaggerated by low lung volumes. Interstitial edema and bilateral effusions are stable. Bibasilar airspace disease likely reflects atelectasis. IMPRESSION: 1. No significant interval change and low lung volumes, mild edema and bilateral effusions. 2. Bibasilar airspace disease likely reflects atelectasis. 3. Support apparatus is stable. Electronically Signed   By: Marin Robertshristopher  Mattern M.D.   On: 11/03/2016 07:32     CULTURES: Sputum 8/31>>>>neg BCX2 8/31>>>  coag neg SA U strep 8/31>>>pos  U legionella 8/31>>>neg  RVP 8/31>>> negative  ANTIBIOTICS: Rocephin 8/28>> Azith 8/28>>>9/3  SIGNIFICANT EVENTS:   LINES/TUBES: OETT 8/30>>> R IJ CVL 8/31 >   DISCUSSION: 41 y/o female with a past medical history of tobacco abuse and COPD admitted for severe CAP with acute respiratory failure with hypoxemia.  Developed worsening hypercarbia and vent dyssynchrony on 8/31 requiring paralytics.   -slow progress.  -CXR w/ worse ATX today. I think she wants more volume and seems to tolerate PCV a little better today Plan Try PCV Lasix x1 Stop azith Change RASS goal to -2 Hope transition to precedex and weaning soon. Her underlying bipolar disease, and possible obstructive lung disease may be a barrier to extubation.   ASSESSMENT / PLAN:  PULMONARY A: Severe acute respiratory failure with hypoxemia but not ARDS as she doesn't have diffuse bilateral airspace disease; favor severe air trapping, bilateral consolidation and atelectasis Presumably COPD, exacerbation Severe CAP + pneumococcal antigen +  PCXR personally reviewed: persistent bibasilar atx/airspce disease. Left a little worse today.  -poor volumes on lower levels of PC than 18.  P:   Change to PCV (she seems to want more Vt) Change RASS goal to -2 if she can tolerate Lasix again today  Cont BDs and ICS Repeat CXR in am  Repeat abg on new vent setting  CARDIOVASCULAR A:  Intermittent hypertension  P:  Cont tele  rx PRN  RENAL A:   Hypokalemia  P:   Replace K Repeat am chem and cont I&O trending w/ active diuresis   GASTROINTESTINAL A:   Inadequate oral intake in setting of critical illness.  P:   Cont tubefeeds and PPI  HEMATOLOGIC A:   Anemia without bleeding P:  Trend cbc Cont IV heparin   INFECTIOUS A:   Severe CAP +pneumococcal antigen  Coag neg staph blood cultures positive 1/2 No sig fever, wbc improved P:   Dc azith Cont rocephin F/u pending  cultures   ENDOCRINE A:   Hyperglycemia   P: ssi  NEUROLOGIC A:   Sedation needs for severe vent dyssychrony Baseline Bipolar P:   Cont home meds Cont propofol and fent Changing RAS goal to -2 Hope to transition to precedex soon  FAMILY  - Updates: updated sister at bedside on 9/1  - Inter-disciplinary family meet or Palliative Care meeting due by:  day 7   My cct 32min  Simonne MartinetPeter E Quinlyn Tep ACNP-BC [facility] Pager # [phone] OR # [phone] if no answer   11/03/2016, 10:59 AM

## 2016-11-04 ENCOUNTER — Inpatient Hospital Stay (HOSPITAL_COMMUNITY): Payer: Medicare Other

## 2016-11-04 ENCOUNTER — Ambulatory Visit: Payer: Self-pay | Admitting: Family Medicine

## 2016-11-04 LAB — COMPREHENSIVE METABOLIC PANEL
ALT: 27 U/L (ref 14–54)
ANION GAP: 8 (ref 5–15)
AST: 24 U/L (ref 15–41)
Albumin: 2.9 g/dL — ABNORMAL LOW (ref 3.5–5.0)
Alkaline Phosphatase: 70 U/L (ref 38–126)
BILIRUBIN TOTAL: 0.5 mg/dL (ref 0.3–1.2)
BUN: 26 mg/dL — AB (ref 6–20)
CO2: 38 mmol/L — ABNORMAL HIGH (ref 22–32)
Calcium: 9.1 mg/dL (ref 8.9–10.3)
Chloride: 99 mmol/L — ABNORMAL LOW (ref 101–111)
Creatinine, Ser: 0.62 mg/dL (ref 0.44–1.00)
Glucose, Bld: 140 mg/dL — ABNORMAL HIGH (ref 65–99)
Potassium: 4.3 mmol/L (ref 3.5–5.1)
Sodium: 145 mmol/L (ref 135–145)
TOTAL PROTEIN: 6.5 g/dL (ref 6.5–8.1)

## 2016-11-04 LAB — BLOOD GAS, ARTERIAL
Acid-Base Excess: 1.7 mmol/L (ref 0.0–2.0)
Bicarbonate: 34.3 mmol/L — ABNORMAL HIGH (ref 20.0–28.0)
DRAWN BY: 41977
FIO2: 100
LHR: 20 {breaths}/min
MECHVT: 370 mL
O2 Saturation: 96.2 %
PATIENT TEMPERATURE: 99
PEEP: 14 cmH2O
PO2 ART: 137 mmHg — AB (ref 83.0–108.0)
pH, Arterial: 6.915 — CL (ref 7.350–7.450)

## 2016-11-04 LAB — TRIGLYCERIDES: TRIGLYCERIDES: 412 mg/dL — AB (ref <150)

## 2016-11-04 LAB — GLUCOSE, CAPILLARY
GLUCOSE-CAPILLARY: 114 mg/dL — AB (ref 65–99)
GLUCOSE-CAPILLARY: 137 mg/dL — AB (ref 65–99)
GLUCOSE-CAPILLARY: 176 mg/dL — AB (ref 65–99)
GLUCOSE-CAPILLARY: 131 mg/dL — AB (ref 65–99)
Glucose-Capillary: 163 mg/dL — ABNORMAL HIGH (ref 65–99)
Glucose-Capillary: 261 mg/dL — ABNORMAL HIGH (ref 65–99)
Glucose-Capillary: 90 mg/dL (ref 65–99)

## 2016-11-04 LAB — CBC
HEMATOCRIT: 36.3 % (ref 36.0–46.0)
Hemoglobin: 11.4 g/dL — ABNORMAL LOW (ref 12.0–15.0)
MCH: 28.9 pg (ref 26.0–34.0)
MCHC: 31.4 g/dL (ref 30.0–36.0)
MCV: 91.9 fL (ref 78.0–100.0)
Platelets: 201 10*3/uL (ref 150–400)
RBC: 3.95 MIL/uL (ref 3.87–5.11)
RDW: 14.9 % (ref 11.5–15.5)
WBC: 9.5 10*3/uL (ref 4.0–10.5)

## 2016-11-04 MED ORDER — POTASSIUM CHLORIDE 20 MEQ/15ML (10%) PO SOLN
40.0000 meq | Freq: Two times a day (BID) | ORAL | Status: AC
  Administered 2016-11-04 – 2016-11-05 (×3): 40 meq
  Filled 2016-11-04 (×3): qty 30

## 2016-11-04 MED ORDER — FUROSEMIDE 10 MG/ML IJ SOLN
40.0000 mg | Freq: Four times a day (QID) | INTRAMUSCULAR | Status: AC
  Administered 2016-11-04 (×2): 40 mg via INTRAVENOUS
  Filled 2016-11-04 (×2): qty 4

## 2016-11-04 MED ORDER — DEXMEDETOMIDINE HCL IN NACL 400 MCG/100ML IV SOLN
0.0000 ug/kg/h | INTRAVENOUS | Status: DC
  Administered 2016-11-04: 1.2 ug/kg/h via INTRAVENOUS
  Administered 2016-11-04: 1.3 ug/kg/h via INTRAVENOUS
  Filled 2016-11-04 (×3): qty 100

## 2016-11-04 MED ORDER — PROPOFOL 1000 MG/100ML IV EMUL
0.0000 ug/kg/min | INTRAVENOUS | Status: DC
  Administered 2016-11-04 – 2016-11-05 (×3): 40 ug/kg/min via INTRAVENOUS
  Filled 2016-11-04 (×3): qty 100

## 2016-11-04 MED ORDER — FENTANYL 2500MCG IN NS 250ML (10MCG/ML) PREMIX INFUSION
25.0000 ug/h | INTRAVENOUS | Status: DC
  Administered 2016-11-04: 400 ug/h via INTRAVENOUS
  Administered 2016-11-05 (×2): 325 ug/h via INTRAVENOUS
  Filled 2016-11-04 (×3): qty 250

## 2016-11-04 MED ORDER — FENTANYL BOLUS VIA INFUSION
50.0000 ug | INTRAVENOUS | Status: DC | PRN
Start: 2016-11-04 — End: 2016-11-05
  Administered 2016-11-05: 50 ug via INTRAVENOUS
  Filled 2016-11-04: qty 50

## 2016-11-04 MED ORDER — CLONAZEPAM 0.1 MG/ML ORAL SUSPENSION: 1.0000 mg | Freq: Two times a day (BID) | ORAL | Status: DC

## 2016-11-04 MED ORDER — SODIUM CHLORIDE 0.9 % IV SOLN
0.0000 ug/kg/h | INTRAVENOUS | Status: DC
  Administered 2016-11-04: 1.2 ug/kg/h via INTRAVENOUS
  Administered 2016-11-04: 0.8 ug/kg/h via INTRAVENOUS
  Filled 2016-11-04 (×3): qty 2

## 2016-11-04 MED ORDER — CLONAZEPAM 1 MG PO TABS
1.0000 mg | ORAL_TABLET | Freq: Two times a day (BID) | ORAL | Status: DC
  Administered 2016-11-04 – 2016-11-05 (×4): 1 mg via ORAL
  Filled 2016-11-04 (×4): qty 1

## 2016-11-04 MED ORDER — MIDAZOLAM HCL 2 MG/2ML IJ SOLN
2.0000 mg | INTRAMUSCULAR | Status: AC
  Administered 2016-11-04: 2 mg via INTRAVENOUS
  Filled 2016-11-04: qty 2

## 2016-11-04 MED ORDER — FENTANYL CITRATE (PF) 100 MCG/2ML IJ SOLN
100.0000 ug | INTRAMUSCULAR | Status: DC | PRN
  Administered 2016-11-04: 100 ug via INTRAVENOUS

## 2016-11-04 MED ORDER — FENTANYL CITRATE (PF) 100 MCG/2ML IJ SOLN
100.0000 ug | INTRAMUSCULAR | Status: DC | PRN
  Administered 2016-11-04: 100 ug via INTRAVENOUS
  Filled 2016-11-04 (×2): qty 2

## 2016-11-04 MED ORDER — FENTANYL CITRATE (PF) 100 MCG/2ML IJ SOLN
50.0000 ug | Freq: Once | INTRAMUSCULAR | Status: AC
  Administered 2016-11-04: 50 ug via INTRAVENOUS

## 2016-11-04 MED ORDER — QUETIAPINE FUMARATE 100 MG PO TABS
100.0000 mg | ORAL_TABLET | Freq: Two times a day (BID) | ORAL | Status: DC
  Administered 2016-11-04 – 2016-11-05 (×3): 100 mg
  Filled 2016-11-04 (×3): qty 1

## 2016-11-04 NOTE — Progress Notes (Addendum)
eLink Physician-Brief Progress Note Patient Name: Latasha [name] DOB: 10/04/1975 MRN: [id number]   Date of Service  11/04/2016  HPI/Events of Note  RN and RT note increased secretions and bradycardia with suctioning.    eICU Interventions  Wean from precedex to propofol. Check CXR.      Intervention Category Major Interventions: Hypoxemia - evaluation and management  Shane Crutchradeep Aaleyah Witherow 11/04/2016, 9:22 PM

## 2016-11-04 NOTE — Progress Notes (Signed)
Pt O2 sat 87%, suctioned pt through ETT, pt HR dropped into 30's w/ no improvement in O2. HR stabilized, notified respiratory who increased FiO2 to 50% and suctioned. Pt brady'd down to less than 30 during suction. HR stabilized afterwards. Notified ELink MD, advised to decrease precedex gtt, keep FiO2 at 50%, and continue to monitor for other cardiac changes.  Currently 94% on 50% FiO2. HR 85, NSR.

## 2016-11-04 NOTE — Progress Notes (Signed)
PULMONARY / CRITICAL CARE MEDICINE   Name: Latasha [name] MRN: [id number] DOB: 01/10/1976     ADMISSION DATE:  10/31/2016 CONSULTATION DATE:  10/31/2016  REFERRING MD:  OSH MD  CHIEF COMPLAINT:  Respiratory failure  BRIEF: 41 y/o smoker with bipolar disorder who was admitted to an OSH with severe CAP on 8/28, required intubation on 8/30 and was transferred to our facility.     SUBJECTIVE:  Looks better  VITAL SIGNS: BP (!) 105/52   Pulse 96   Temp 98.9 F (37.2 C) (Oral)   Resp (!) 22   Ht 5\' 7"  (1.702 m)   Wt 224 lb 10.4 oz (101.9 kg)   SpO2 94%   BMI 35.18 kg/m   HEMODYNAMICS:    VENTILATOR SETTINGS: Vent Mode: PCV FiO2 (%):  [40 %-50 %] 50 % Set Rate:  [25 bmp] 25 bmp PEEP:  [8 cmH20] 8 cmH20 Plateau Pressure:  [21 cmH20-23 cmH20] 21 cmH20  INTAKE / OUTPUT:  Intake/Output Summary (Last 24 hours) at 11/04/16 0926 Last data filed at 11/04/16 0900  Gross per 24 hour  Intake          2612.85 ml  Output             3430 ml  Net          -817.15 ml     PHYSICAL EXAMINATION: General appearance:  41 Year old  female, sedated on vent Eyes: anicteric sclerae, moist conjunctivae; PERRL, EOMI bilaterally. Mouth:  membranes and no mucosal ulcerations; normal hard and soft palate, orally intubated Neck: Trachea midline; neck supple, no JVD Lungs/chest: crackles posterior bases, with normal respiratory effort and no intercostal retractions CV: RRR, no MRGs  Abdomen: Soft, non-tender; no masses or HSM Extremities: No peripheral edema or extremity lymphadenopathy Skin: Normal temperature, turgor and texture; no rash, ulcers or subcutaneous nodules Psych: sedated on vent   LABS:  BMET  Recent Labs Lab 11/02/16 0430 11/03/16 0530 11/04/16 0405  NA 142 144 145  K 3.0* 3.5 4.3  CL 98* 98* 99*  CO2 35* 37* 38*  BUN 14 20 26*  CREATININE 0.65 0.66 0.62  GLUCOSE 148* 111* 140*    Electrolytes  Recent Labs Lab 10/31/16 1636  11/01/16 0306 11/01/16 1850  11/02/16 0430 11/03/16 0530 11/04/16 0405  CALCIUM  --   < > 8.4*  --  8.9 8.9 9.1  MG 2.4  --  2.5* 2.6*  --   --   --   PHOS 3.7  --  3.0 4.3  --   --   --   < > = values in this interval not displayed.  CBC  Recent Labs Lab 11/02/16 0430 11/03/16 0530 11/04/16 0405  WBC 12.8* 8.9 9.5  HGB 11.4* 10.7* 11.4*  HCT 34.9* 34.6* 36.3  PLT 199 193 201    Coag's No results for input(s): APTT, INR in the last 168 hours.  Sepsis Markers  Recent Labs Lab 10/31/16 1636 11/01/16 0306 11/01/16 0307 11/01/16 1200 11/02/16 0430  LATICACIDVEN 1.5  --  2.7* 2.3*  --   PROCALCITON 0.16 1.19  --   --  1.94    ABG  Recent Labs Lab 11/01/16 0410 11/01/16 1411 11/02/16 1304  PHART 7.330* 7.436 7.418  PCO2ART 65.8* 57.1* 62.9*  PO2ART 92.3 131.0* 79.0*    Liver Enzymes  Recent Labs Lab 11/04/16 0405  AST 24  ALT 27  ALKPHOS 70  BILITOT 0.5  ALBUMIN 2.9*    Cardiac Enzymes  No results for input(s): TROPONINI, PROBNP in the last 168 hours.  Glucose  Recent Labs Lab 11/03/16 1123 11/03/16 1539 11/03/16 1938 11/03/16 2354 11/04/16 0330 11/04/16 0734  GLUCAP 139* 104* 128* 116* 114* 137*    Imaging Dg Chest Port 1 View  Result Date: 11/04/2016 CLINICAL DATA:  41 year old female with pneumonia. Subsequent encounter. EXAM: PORTABLE CHEST 1 VIEW COMPARISON:  11/03/2016. FINDINGS: Endotracheal tube tip 5.8 cm above the carina. Right central line tip proximal superior vena cava level. Nasogastric tube courses below the diaphragm. Tip is not included on the present exam. Poor inspiration with bibasilar parenchymal changes suspicious for atelectasis although pneumonia not excluded. Appearance unchanged. Pulmonary vascular prominence without pulmonary edema. No pneumothorax. Mediastinal and cardiac silhouette unchanged. IMPRESSION: Poor inspiratory exam with bibasilar parenchymal changes similar to prior examination suggestive of atelectasis although pneumonia not  excluded. Electronically Signed   By: Lacy Duverney M.D.   On: 11/04/2016 07:10     CULTURES: Sputum 8/31>>>>neg BCX2 8/31>>> coag neg SA U strep 8/31>>>pos  U legionella 8/31>>>neg  RVP 8/31>>> negative  ANTIBIOTICS: Rocephin 8/28>> Azith 8/28>>>9/3  SIGNIFICANT EVENTS:   LINES/TUBES: OETT 8/30>>> R IJ CVL 8/31 >   DISCUSSION: 41 y/o female with a past medical history of tobacco abuse and COPD admitted for severe CAP with acute respiratory failure with hypoxemia.  Developed worsening hypercarbia and vent dyssynchrony on 8/31 requiring paralytics.   -slow progress.  -CXR better Plan Start precedex Repeat lasix Attempt to wean  ASSESSMENT / PLAN:  Acute respiratory failure with hypoxemia but not ARDS as she doesn't have diffuse bilateral airspace disease; favor severe air trapping, bilateral consolidation and atelectasis Presumably COPD w/ exacerbation Severe CAP + pneumococcal antigen +  PCXR personally reviewed: slight improved aeration  P:   Cont PCV w/ rest Change RASS goal to -1 to -2 PSV as tolerated Cont BDs & ICS Repeat lasix Cont rocephin will dc after todays dose   Sedation needs for severe vent dyssychrony Baseline Bipolar P:   Cont home meds Transition to precedex and PRN morphine RASS goal -1 to -2  Intermittent hypertension  P:  Cont tele   Fluid and electrolyte imbalance  P:   Replace K Repeat chem in am w/ planned lasix   Inadequate oral intake in setting of critical illness.  P:   Cont tubefeeds and PPI  Hyperglycemia   P: ssi   Anemia without bleeding P:  Trend cbc, [place] heparin   FAMILY  - Updates: updated sister at bedside on 9/1  - Inter-disciplinary family meet or Palliative Care meeting due by:  day 7 My cct 34 min  Simonne Martinet ACNP-BC [facility] Pulmonary/Critical Care Pager # [phone] OR # [phone] if no answer   11/04/2016, 9:26 AM

## 2016-11-04 NOTE — Progress Notes (Signed)
RT note- Attempted to wean now that precedex had been started. Failed wean due to agitation, placed back to full support.

## 2016-11-04 NOTE — Clinical Social Work Note (Signed)
Clinical Social Work Assessment  Patient Details  Name: Latasha [name] MRN: [id number] Date of Birth: 1975-10-17  Date of referral:  11/04/16               Reason for consult:  Facility Placement                Permission sought to share information with:  Family Supports Permission granted to share information::  No (patient on vent and asleep; mother at bedside)  Name::     Jenny Reichmann Phillps  Agency::     Relationship::  mother  Contact Information:  [phone]  Housing/Transportation Living arrangements for the past 2 months:  Single Family Home (lives with her cousins in [place]) Source of Information:  Parent Patient Interpreter Needed:  None Criminal Activity/Legal Involvement Pertinent to Current Situation/Hospitalization:  No - Comment as needed Significant Relationships:  Other Family Members, Parents Lives with:  Other (Comment) (cousins) Do you feel safe going back to the place where you live?  Yes Need for family participation in patient care:  Yes (Comment)  Care giving concerns: Patient from home with family members. Patient on vent and tube feeds. Mother at bedside; mother works at [employer] as Therapist, sports.   Social Worker assessment / plan: CSW met with mother at bedside. Patient asleep and on vent. Mother reported patient was at [facility] and transferred to [facility]. CSW was consulted for patient from SNF, but patient not facility resident. Mother reports patient lives at home in [place] with her cousins. No PT recommendations for patient at this time and no SNF consult. CSW signing off as no social work needs indicated.   Employment status:    Insurance information:  Managed Medicare [facility]; mother thinks patient may also have Medicaid) PT Recommendations:  Not assessed at this time Information / Referral to community resources:     Patient/Family's Response to care: Mother appreciative of care.  Patient/Family's Understanding of and Emotional Response to Diagnosis,  Current Treatment, and Prognosis: Mother with good understanding of patient's condition and care needs. Mother hopeful for patient's return home when medically stable.  Emotional Assessment Appearance:  Appears stated age Attitude/Demeanor/Rapport:  Unable to Assess Affect (typically observed):  Unable to Assess Orientation:   (unable to assess) Alcohol / Substance use:  Tobacco Use Psych involvement (Current and /or in the community):  No (Comment)  Discharge Needs  Concerns to be addressed:  Discharge Planning Concerns Readmission within the last 30 days:  No Current discharge risk:  Physical Impairment Barriers to Discharge:  Continued Medical Work up   Estanislado Emms, [place] 11/04/2016, 2:44 PM

## 2016-11-05 LAB — BASIC METABOLIC PANEL
Anion gap: 9 (ref 5–15)
BUN: 34 mg/dL — ABNORMAL HIGH (ref 6–20)
CALCIUM: 9.2 mg/dL (ref 8.9–10.3)
CHLORIDE: 102 mmol/L (ref 101–111)
CO2: 35 mmol/L — AB (ref 22–32)
Creatinine, Ser: 0.8 mg/dL (ref 0.44–1.00)
GFR calc Af Amer: >60 mL/min (ref >60)
GFR calc non Af Amer: >60 mL/min (ref >60)
GLUCOSE: 127 mg/dL — AB (ref 65–99)
POTASSIUM: 3.8 mmol/L (ref 3.5–5.1)
Sodium: 146 mmol/L — ABNORMAL HIGH (ref 135–145)

## 2016-11-05 LAB — CBC
HCT: 36.7 % (ref 36.0–46.0)
HEMOGLOBIN: 11.4 g/dL — AB (ref 12.0–15.0)
MCH: 28.4 pg (ref 26.0–34.0)
MCHC: 31.1 g/dL (ref 30.0–36.0)
MCV: 91.5 fL (ref 78.0–100.0)
Platelets: 226 10*3/uL (ref 150–400)
RBC: 4.01 MIL/uL (ref 3.87–5.11)
RDW: 14.5 % (ref 11.5–15.5)
WBC: 10.1 10*3/uL (ref 4.0–10.5)

## 2016-11-05 LAB — GLUCOSE, CAPILLARY
GLUCOSE-CAPILLARY: 132 mg/dL — AB (ref 65–99)
Glucose-Capillary: 110 mg/dL — ABNORMAL HIGH (ref 65–99)
Glucose-Capillary: 124 mg/dL — ABNORMAL HIGH (ref 65–99)
Glucose-Capillary: 171 mg/dL — ABNORMAL HIGH (ref 65–99)
Glucose-Capillary: 121 mg/dL — ABNORMAL HIGH (ref 65–99)
Glucose-Capillary: 97 mg/dL (ref 65–99)

## 2016-11-05 LAB — PHOSPHORUS: PHOSPHORUS: 3.7 mg/dL (ref 2.5–4.6)

## 2016-11-05 LAB — CULTURE, BLOOD (ROUTINE X 2)
CULTURE: NO GROWTH
SPECIAL REQUESTS: ADEQUATE

## 2016-11-05 LAB — MAGNESIUM: Magnesium: 2.4 mg/dL (ref 1.7–2.4)

## 2016-11-05 LAB — PROCALCITONIN: Procalcitonin: 0.36 ng/mL

## 2016-11-05 MED ORDER — OLANZAPINE 10 MG PO TABS: 10.0000 mg | ORAL_TABLET | Freq: Every day | ORAL | Status: DC

## 2016-11-05 MED ORDER — FLUOXETINE HCL 20 MG/5ML PO SOLN
20.0000 mg | Freq: Every day | ORAL | Status: DC
  Filled 2016-11-05: qty 5

## 2016-11-05 MED ORDER — ACETAMINOPHEN 160 MG/5ML PO SOLN
650.0000 mg | Freq: Four times a day (QID) | ORAL | Status: DC | PRN
  Administered 2016-11-05: 650 mg via ORAL
  Filled 2016-11-05: qty 20.3

## 2016-11-05 MED ORDER — LORAZEPAM 2 MG/ML IJ SOLN: 1.0000 mg | INTRAMUSCULAR | Status: DC | PRN

## 2016-11-05 MED ORDER — FUROSEMIDE 10 MG/ML IJ SOLN
40.0000 mg | Freq: Once | INTRAMUSCULAR | Status: AC
  Administered 2016-11-05: 40 mg via INTRAVENOUS
  Filled 2016-11-05: qty 4

## 2016-11-05 MED ORDER — LAMOTRIGINE 100 MG PO TABS
100.0000 mg | ORAL_TABLET | Freq: Every day | ORAL | Status: DC
  Filled 2016-11-05: qty 1

## 2016-11-05 MED ORDER — QUETIAPINE FUMARATE 100 MG PO TABS
100.0000 mg | ORAL_TABLET | Freq: Two times a day (BID) | ORAL | Status: DC
  Administered 2016-11-05: 100 mg via ORAL
  Filled 2016-11-05 (×2): qty 1

## 2016-11-05 MED ORDER — FENTANYL CITRATE (PF) 100 MCG/2ML IJ SOLN: 50.0000 ug | INTRAMUSCULAR | Status: DC | PRN

## 2016-11-05 MED ORDER — ORAL CARE MOUTH RINSE
15.0000 mL | Freq: Two times a day (BID) | OROMUCOSAL | Status: DC
  Administered 2016-11-05 – 2016-11-08 (×5): 15 mL via OROMUCOSAL

## 2016-11-05 MED ORDER — FREE WATER
200.0000 mL | Status: DC
  Administered 2016-11-05: 200 mL

## 2016-11-05 MED ORDER — LACTATED RINGERS IV SOLN
INTRAVENOUS | Status: DC
  Administered 2016-11-05: 13:00:00 via INTRAVENOUS

## 2016-11-05 MED ORDER — SODIUM CHLORIDE 0.9 % IV SOLN
0.4000 ug/kg/h | INTRAVENOUS | Status: DC
  Filled 2016-11-05: qty 2

## 2016-11-05 MED ORDER — DEXMEDETOMIDINE HCL IN NACL 400 MCG/100ML IV SOLN
0.4000 ug/kg/h | INTRAVENOUS | Status: DC
  Administered 2016-11-05: 1 ug/kg/h via INTRAVENOUS
  Filled 2016-11-05: qty 100

## 2016-11-05 NOTE — Progress Notes (Signed)
PULMONARY / CRITICAL CARE MEDICINE   Name: Frederich Chicklita N Seddon MRN: [id number] DOB: 01/06/1976     ADMISSION DATE:  10/31/2016 CONSULTATION DATE:  10/31/2016  REFERRING MD:  OSH MD  CHIEF COMPLAINT:  Respiratory failure  BRIEF: 41 y/o smoker with bipolar disorder who was admitted to an OSH with severe CAP on 8/28, required intubation on 8/30 and was transferred to our facility.     SUBJECTIVE:  Excellent F/vt   VITAL SIGNS: BP 126/74   Pulse 96   Temp 100 F (37.8 C) (Oral)   Resp (!) 25   Ht 5\' 7"  (1.702 m)   Wt 223 lb 15.8 oz (101.6 kg)   SpO2 94%   BMI 35.08 kg/m   HEMODYNAMICS:    VENTILATOR SETTINGS: Vent Mode: PCV FiO2 (%):  [40 %-50 %] 50 % Set Rate:  [25 bmp] 25 bmp PEEP:  [8 cmH20] 8 cmH20 Plateau Pressure:  [21 cmH20-22 cmH20] 22 cmH20  INTAKE / OUTPUT:  Intake/Output Summary (Last 24 hours) at 11/05/16 0858 Last data filed at 11/05/16 [id number]  Gross per 24 hour  Intake          2669.25 ml  Output             2945 ml  Net          -275.75 ml     PHYSICAL EXAMINATION: General appearance:  41 Year old  female, well nourished, sedated on vent but looks comfortable on PSV Eyes: anicteric sclerae, moist conjunctivae; PERRL, EOMI bilaterally. Mouth:  membranes and no mucosal ulcerations; normal hard and soft palate, orally intubated Neck: Trachea midline; neck supple, no JVD Lungs/chest: CTA, with normal respiratory effort and no intercostal retractions CV: RRR, no MRGs  Abdomen: Soft, non-tender; no masses or HSM Extremities: No peripheral edema or extremity lymphadenopathy Skin: Normal temperature, turgor and texture; no rash, ulcers or subcutaneous nodules Psych: sedated on vent   LABS:  BMET  Recent Labs Lab 11/03/16 0530 11/04/16 0405 11/05/16 0430  NA 144 145 146*  K 3.5 4.3 3.8  CL 98* 99* 102  CO2 37* 38* 35*  BUN 20 26* 34*  CREATININE 0.66 0.62 0.80  GLUCOSE 111* 140* 127*    Electrolytes  Recent Labs Lab 11/01/16 0306  11/01/16 1850  11/03/16 0530 11/04/16 0405 11/05/16 0430  CALCIUM 8.4*  --   < > 8.9 9.1 9.2  MG 2.5* 2.6*  --   --   --  2.4  PHOS 3.0 4.3  --   --   --  3.7  < > = values in this interval not displayed.  CBC  Recent Labs Lab 11/03/16 0530 11/04/16 0405 11/05/16 0430  WBC 8.9 9.5 10.1  HGB 10.7* 11.4* 11.4*  HCT 34.6* 36.3 36.7  PLT 193 201 226    Coag's No results for input(s): APTT, INR in the last 168 hours.  Sepsis Markers  Recent Labs Lab 10/31/16 1636 11/01/16 0306 11/01/16 0307 11/01/16 1200 11/02/16 0430  LATICACIDVEN 1.5  --  2.7* 2.3*  --   PROCALCITON 0.16 1.19  --   --  1.94    ABG  Recent Labs Lab 11/01/16 0410 11/01/16 1411 11/02/16 1304  PHART 7.330* 7.436 7.418  PCO2ART 65.8* 57.1* 62.9*  PO2ART 92.3 131.0* 79.0*    Liver Enzymes  Recent Labs Lab 11/04/16 0405  AST 24  ALT 27  ALKPHOS 70  BILITOT 0.5  ALBUMIN 2.9*    Cardiac Enzymes No results for input(s): TROPONINI, PROBNP  in the last 168 hours.  Glucose  Recent Labs Lab 11/04/16 1118 11/04/16 1537 11/04/16 2004 11/04/16 2359 11/05/16 0402 11/05/16 0807  GLUCAP 163* 176* 131* 90 110* 124*    Imaging Dg Chest Port 1 View  Result Date: 11/04/2016 CLINICAL DATA:  Hypoxia EXAM: PORTABLE CHEST 1 VIEW COMPARISON:  Study obtained earlier in the day FINDINGS: Endotracheal tube tip is 4.3 cm above the carina. Central catheter tip is in the superior vena cava. Nasogastric tube tip and side port below the diaphragm. No pneumothorax. There are small pleural effusions bilaterally with patchy bibasilar atelectasis. There is airspace consolidation in the left lower lobe. Heart is borderline enlarged with pulmonary vascularity within normal limits. No adenopathy. No bone lesions. IMPRESSION: Tube and catheter positions as described without evident pneumothorax. Bibasilar atelectasis with small pleural effusions bilaterally. Consolidation left lower lobe, concerning for pneumonia.  Stable cardiac silhouette. Electronically Signed   By: Bretta Bang III M.D.   On: 11/04/2016 21:51     CULTURES: Sputum 8/31>>>>neg BCX2 8/31>>> coag neg SA U strep 8/31>>>neg  U legionella 8/31>>>neg  RVP 8/31>>> negative  ANTIBIOTICS: Rocephin 8/28>>8/31 Azith 8/28>>>9/3  SIGNIFICANT EVENTS:   LINES/TUBES: OETT 8/30>>> R IJ CVL 8/31 >   DISCUSSION: 41 y/o female with a past medical history of tobacco abuse and COPD admitted for severe CAP with acute respiratory failure with hypoxemia.  Developed worsening hypercarbia and vent dyssynchrony on 8/31 requiring paralytics.   -slow progress.  -CXR better Plan Weaning If can manage agitation we can extubate   ASSESSMENT / PLAN:  Acute respiratory failure with hypoxemia but not ARDS as she doesn't have diffuse bilateral airspace disease;  Presumably COPD w/ exacerbation Severe CAP (NOS) ->excellent effort on SBT.  ->variable here will be agitation P:   Cont SBT this am Transition back to precedex->goal RASS 0 to -1 Assess for extubation vs cont PSV trials  Fever Spiked fever over night. WBC no change  P: Will cont to trend PCT, wbc and repeat cxr in am   Sedation needs for severe vent dyssychrony Baseline Bipolar P:   Resume precedex Dx prop Cont home meds w/ additional Seroquel and clonazepam -->may need further titration   Intermittent hypertension  P:  Cont tele    Fluid and electrolyte imbalance  -Hypernatremia  P:   Replace free water Repeat am chem    Inadequate oral intake in setting of critical illness.  P:   Cont PPI Hold tubefeeds this am-->possible extubation   Hyperglycemia   P: ssi   Anemia without bleeding P:  Trend cbc and cont [place] heparin   FAMILY  - Updates: updated sister at bedside on 9/1  - Inter-disciplinary family meet or Palliative Care meeting due by:  day 7 My cct 32 min   11/05/2016, 8:58 AM

## 2016-11-05 NOTE — Progress Notes (Signed)
eLink Physician-Brief Progress Note Patient Name: Latasha [name] DOB: 04/28/1975 MRN: [id number]   Date of Service  11/05/2016  HPI/Events of Note  Temp 101  tylenal  eICU Interventions       Intervention Category Minor Interventions: Routine modifications to care plan (e.g. PRN medications for pain, fever)  Nelda BucksFEINSTEIN,DANIEL J. 11/05/2016, 12:09 AM

## 2016-11-05 NOTE — Progress Notes (Signed)
200mcg fentayl wasted in sink, witnessed by The TJX CompaniesJohny RN

## 2016-11-05 NOTE — Procedures (Signed)
Extubation Procedure Note  Patient Details:   Name: Latasha [name] DOB: 04/12/1975 MRN: [id number]   Airway Documentation:     Evaluation  O2 sats: stable throughout Complications: No apparent complications Patient did tolerate procedure well. Bilateral Breath Sounds: Clear, Diminished   Yes  Placed on 6l/min [place], continue to monitor.  Newt LukesGroendal, Darianna Amy Ann 11/05/2016, 11:27 AM

## 2016-11-06 ENCOUNTER — Inpatient Hospital Stay (HOSPITAL_COMMUNITY): Payer: Medicare Other

## 2016-11-06 LAB — GLUCOSE, CAPILLARY
GLUCOSE-CAPILLARY: 115 mg/dL — AB (ref 65–99)
GLUCOSE-CAPILLARY: 86 mg/dL (ref 65–99)
GLUCOSE-CAPILLARY: 93 mg/dL (ref 65–99)
Glucose-Capillary: 117 mg/dL — ABNORMAL HIGH (ref 65–99)
Glucose-Capillary: 174 mg/dL — ABNORMAL HIGH (ref 65–99)
Glucose-Capillary: 105 mg/dL — ABNORMAL HIGH (ref 65–99)

## 2016-11-06 LAB — CBC
HCT: 36.3 % (ref 36.0–46.0)
Hemoglobin: 11.6 g/dL — ABNORMAL LOW (ref 12.0–15.0)
MCH: 28.9 pg (ref 26.0–34.0)
MCHC: 32 g/dL (ref 30.0–36.0)
MCV: 90.3 fL (ref 78.0–100.0)
PLATELETS: 228 10*3/uL (ref 150–400)
RBC: 4.02 MIL/uL (ref 3.87–5.11)
RDW: 14.7 % (ref 11.5–15.5)
WBC: 9.7 10*3/uL (ref 4.0–10.5)

## 2016-11-06 LAB — COMPREHENSIVE METABOLIC PANEL
ALBUMIN: 3 g/dL — AB (ref 3.5–5.0)
ALT: 26 U/L (ref 14–54)
ANION GAP: 8 (ref 5–15)
AST: 38 U/L (ref 15–41)
Alkaline Phosphatase: 60 U/L (ref 38–126)
BUN: 26 mg/dL — AB (ref 6–20)
CHLORIDE: 103 mmol/L (ref 101–111)
CO2: 32 mmol/L (ref 22–32)
Calcium: 9.3 mg/dL (ref 8.9–10.3)
Creatinine, Ser: 0.58 mg/dL (ref 0.44–1.00)
GFR calc Af Amer: >60 mL/min (ref >60)
GFR calc non Af Amer: >60 mL/min (ref >60)
GLUCOSE: 116 mg/dL — AB (ref 65–99)
POTASSIUM: 3.6 mmol/L (ref 3.5–5.1)
SODIUM: 143 mmol/L (ref 135–145)
Total Bilirubin: 0.7 mg/dL (ref 0.3–1.2)
Total Protein: 6.9 g/dL (ref 6.5–8.1)

## 2016-11-06 LAB — PROCALCITONIN: PROCALCITONIN: 0.21 ng/mL

## 2016-11-06 MED ORDER — BUPRENORPHINE HCL-NALOXONE HCL 8-2 MG SL SUBL
1.0000 | SUBLINGUAL_TABLET | Freq: Two times a day (BID) | SUBLINGUAL | Status: DC
  Administered 2016-11-06 – 2016-11-08 (×4): 1 via SUBLINGUAL
  Filled 2016-11-06 (×5): qty 1

## 2016-11-06 MED ORDER — SODIUM CHLORIDE 0.9% FLUSH: 3.0000 mL | INTRAVENOUS | Status: DC | PRN

## 2016-11-06 MED ORDER — INSULIN ASPART 100 UNIT/ML ~~LOC~~ SOLN
0.0000 [IU] | Freq: Three times a day (TID) | SUBCUTANEOUS | Status: DC
  Administered 2016-11-06 – 2016-11-07 (×2): 3 [IU] via SUBCUTANEOUS
  Administered 2016-11-07: 2 [IU] via SUBCUTANEOUS

## 2016-11-06 MED ORDER — ZIPRASIDONE HCL 80 MG PO CAPS
80.0000 mg | ORAL_CAPSULE | Freq: Two times a day (BID) | ORAL | Status: DC
  Administered 2016-11-06 – 2016-11-08 (×4): 80 mg via ORAL
  Filled 2016-11-06 (×5): qty 1

## 2016-11-06 MED ORDER — INSULIN ASPART 100 UNIT/ML ~~LOC~~ SOLN: 0.0000 [IU] | Freq: Every day | SUBCUTANEOUS | Status: DC

## 2016-11-06 MED ORDER — DIAZEPAM 5 MG PO TABS
5.0000 mg | ORAL_TABLET | Freq: Three times a day (TID) | ORAL | Status: DC | PRN
  Administered 2016-11-06: 5 mg via ORAL
  Filled 2016-11-06: qty 1

## 2016-11-06 MED ORDER — SODIUM CHLORIDE 0.9% FLUSH
3.0000 mL | Freq: Two times a day (BID) | INTRAVENOUS | Status: DC
  Administered 2016-11-06 – 2016-11-08 (×5): 3 mL via INTRAVENOUS

## 2016-11-06 MED ORDER — NALOXONE HCL 0.4 MG/ML IJ SOLN
INTRAMUSCULAR | Status: AC
  Filled 2016-11-06: qty 1

## 2016-11-06 MED ORDER — SODIUM CHLORIDE 0.9 % IV SOLN: 250.0000 mL | INTRAVENOUS | Status: DC | PRN

## 2016-11-06 MED ORDER — NALOXONE HCL 0.4 MG/ML IJ SOLN
0.4000 mg | Freq: Once | INTRAMUSCULAR | Status: AC
  Administered 2016-11-06: 0.4 mg via INTRAVENOUS

## 2016-11-06 MED ORDER — GABAPENTIN 800 MG PO TABS
800.0000 mg | ORAL_TABLET | Freq: Three times a day (TID) | ORAL | Status: DC
  Filled 2016-11-06 (×3): qty 1

## 2016-11-06 MED ORDER — FLUOXETINE HCL 20 MG PO CAPS
40.0000 mg | ORAL_CAPSULE | Freq: Every day | ORAL | Status: DC
  Administered 2016-11-07 – 2016-11-08 (×2): 40 mg via ORAL
  Filled 2016-11-06 (×2): qty 2

## 2016-11-06 MED ORDER — FLUOXETINE HCL 20 MG PO CAPS
20.0000 mg | ORAL_CAPSULE | Freq: Every day | ORAL | Status: DC
  Filled 2016-11-06: qty 1

## 2016-11-06 MED ORDER — BENZTROPINE MESYLATE 1 MG PO TABS
0.5000 mg | ORAL_TABLET | Freq: Two times a day (BID) | ORAL | Status: DC
  Administered 2016-11-06 – 2016-11-08 (×4): 0.5 mg via ORAL
  Filled 2016-11-06 (×5): qty 1

## 2016-11-06 MED ORDER — GABAPENTIN 400 MG PO CAPS
800.0000 mg | ORAL_CAPSULE | Freq: Three times a day (TID) | ORAL | Status: DC
  Administered 2016-11-06 – 2016-11-08 (×5): 800 mg via ORAL
  Filled 2016-11-06 (×7): qty 2

## 2016-11-06 NOTE — Significant Event (Addendum)
Rapid Response Event Note  Overview: Time Called: 2223 Arrival Time: 2230 Event Type: Neurologic  Initial Focused Assessment: Called by RN to assess patient for somnolence, per RN, earlier when patient was intitally assessed, patient was alert,verbal, and following commands. Upon my arrival, patient was hard to arouse, did not respond to voice but did respond to painful stimuli, after several attempts of stimulation, patient responded.  RN and  I attempted to give patient schedule medications but patient quickly fell back sleep.  Lung sounds clear in the uppers, diminished in the lowers, good air movement overall.  Sats maintaining > 92% on 4L nasal cannula.  VSS. I spoke with [facility] MD, we discussed the patient, will order 0.4 Narcan IV and rounding team will come see patient.   Interventions: - Narcan given at 2315, rounding team at bedside, patient quickly woke up, was able to move herself around in the bed, stated she was uncomfortable and was trying to repostion herself.   -Recruitment consultantafety sitter order as well.   Plan of Care (if not transferred): - will follow - called RN at 0000 and 0230 for updates, no acute changes per RN Event Summary: Name of Physician Notified: Dr. Lanier EnsignSommer ELINK at 2250    at    Outcome: Stayed in room and stabalized  Event End Time: 2315  Ivery Michalski R

## 2016-11-06 NOTE — Progress Notes (Signed)
PCCM Interval Progress Note  Called to bedside for acute change in mental status. Patients RN reports patient completely awake, coherent, and oriented earlier in the evening.  However when she came to give scheduled medications, patient found only responsive to pain, protecting her airway.  Patient given Narcan 0.4mg  IV by Rapid Response per order of Elink with immediate improvement in mental status.  On arrival, patient sitting in bed, now awake and talking in no distress. VVS.  On review of MAR, patient was given her scheduled Suboxone at 1037 and Geodon at [phone]. No other medications noted to have caused this acute change in MS.  Plan:    - Will order a safety sitter to bedside.  Unsure why patient had acute mental status change with improvement after narcan.  Do not think that her AM meds would have caused this.  Unknown if patient had prior vistors.  No belongings in the bed with patient. - Will continue to monitor for for changes in mental status for when narcan wears off.    Latasha [name], AGACNP-BC [place] [facility] Pgr: [phone] or if no answer [phone] 11/06/2016, 11:46 PM

## 2016-11-06 NOTE — Progress Notes (Signed)
PULMONARY / CRITICAL CARE MEDICINE   Name: Latasha [name] MRN: [id number] DOB: 01/20/1976     ADMISSION DATE:  10/31/2016 CONSULTATION DATE:  10/31/2016  REFERRING MD:  OSH MD  CHIEF COMPLAINT:  Respiratory failure  BRIEF: 41 y/o smoker with bipolar disorder who was admitted to an OSH with severe CAP on 8/28, required intubation on 8/30 and was transferred to our facility.     SUBJECTIVE:  Tolerated extubation 9/5 well. On HFNC this AM but weaning down.  CXR with left basilar > right basilar atx.  VITAL SIGNS: BP (!) 113/46 (BP Location: Left Arm)   Pulse 90   Temp (!) 97.2 F (36.2 C) (Axillary)   Resp 11   Ht 5\' 7"  (1.702 m)   Wt 97.9 kg (215 lb 13.3 oz)   SpO2 96%   BMI 33.80 kg/m   HEMODYNAMICS:    VENTILATOR SETTINGS: FiO2 (%):  [50 %] 50 %  INTAKE / OUTPUT:  Intake/Output Summary (Last 24 hours) at 11/06/16 0946 Last data filed at 11/06/16 0800  Gross per 24 hour  Intake            696.7 ml  Output             3225 ml  Net          -2528.3 ml     PHYSICAL EXAMINATION: General appearance:  41 Year old  female, well nourished, in NAD Eyes: anicteric sclerae, moist conjunctivae; PERRL, EOMI bilaterally. Mouth:  MMM Neck: Trachea midline; neck supple, no JVD.  R IJ CVL in place Lungs/chest: CTA, with normal respiratory effort and no intercostal retractions CV: RRR, no MRGs  Abdomen: Soft, non-tender; no masses or HSM Extremities: No peripheral edema or extremity lymphadenopathy Skin: Normal temperature, turgor and texture; no rash, ulcers or subcutaneous nodules Psych: normal mood and affect  LABS:  BMET  Recent Labs Lab 11/04/16 0405 11/05/16 0430 11/06/16 0459  NA 145 146* 143  K 4.3 3.8 3.6  CL 99* 102 103  CO2 38* 35* 32  BUN 26* 34* 26*  CREATININE 0.62 0.80 0.58  GLUCOSE 140* 127* 116*    Electrolytes  Recent Labs Lab 11/01/16 0306 11/01/16 1850  11/04/16 0405 11/05/16 0430 11/06/16 0459  CALCIUM 8.4*  --   < > 9.1 9.2 9.3   MG 2.5* 2.6*  --   --  2.4  --   PHOS 3.0 4.3  --   --  3.7  --   < > = values in this interval not displayed.  CBC  Recent Labs Lab 11/04/16 0405 11/05/16 0430 11/06/16 0459  WBC 9.5 10.1 9.7  HGB 11.4* 11.4* 11.6*  HCT 36.3 36.7 36.3  PLT 201 226 228    Coag's No results for input(s): APTT, INR in the last 168 hours.  Sepsis Markers  Recent Labs Lab 10/31/16 1636  11/01/16 0307 11/01/16 1200 11/02/16 0430 11/05/16 1030 11/06/16 0459  LATICACIDVEN 1.5  --  2.7* 2.3*  --   --   --   PROCALCITON 0.16  < >  --   --  1.94 0.36 0.21  < > = values in this interval not displayed.  ABG  Recent Labs Lab 11/01/16 0410 11/01/16 1411 11/02/16 1304  PHART 7.330* 7.436 7.418  PCO2ART 65.8* 57.1* 62.9*  PO2ART 92.3 131.0* 79.0*    Liver Enzymes  Recent Labs Lab 11/04/16 0405 11/06/16 0459  AST 24 38  ALT 27 26  ALKPHOS 70 60  BILITOT 0.5  0.7  ALBUMIN 2.9* 3.0*    Cardiac Enzymes No results for input(s): TROPONINI, PROBNP in the last 168 hours.  Glucose  Recent Labs Lab 11/05/16 1200 11/05/16 1540 11/05/16 1959 11/05/16 2321 11/06/16 0356 11/06/16 0752  GLUCAP 171* 132* 121* 97 117* 115*    Imaging Dg Chest Port 1 View  Result Date: 11/06/2016 CLINICAL DATA:  Acute respiratory failure, pneumonia, current smoker. EXAM: PORTABLE CHEST 1 VIEW COMPARISON:  Portable chest x-ray of November 04, 2016 FINDINGS: The trachea and esophagus have been extubated. There has been interval improvement in the appearance of the right lung base. On the left increased density persists and the hemidiaphragm remains obscured. The cardiac silhouette is mildly enlarged. The pulmonary vascularity is mildly engorged. The mediastinum is normal in width. The right internal jugular venous catheter tip projects over the midportion of the SVC. IMPRESSION: Persistent left lower lobe atelectasis or pneumonia. Improving right basilar atelectasis or infiltrate. Mild pulmonary vascular  engorgement is observed but has decreased. Electronically Signed   By: David  [place] M.D.   On: 11/06/2016 07:19     CULTURES: Sputum 8/31>>>>neg BCX2 8/31>>> coag neg SA U strep 8/31>>>neg  U legionella 8/31>>>neg  RVP 8/31>>> negative  ANTIBIOTICS: Rocephin 8/28>>8/31 Azith 8/28>>>9/3  SIGNIFICANT EVENTS: 8/30 > intubated 9/5 > extubated 9/6 > transferred out of ICU  LINES/TUBES: OETT 8/30>>> 9/6 R IJ CVL 8/31 > 9/6  DISCUSSION: 41 y/o female with a past medical history of tobacco abuse and COPD admitted for severe CAP with acute respiratory failure with hypoxemia.  Developed worsening hypercarbia and vent dyssynchrony on 8/31 requiring paralytics.   Extubated 9/5 and tolerated well.  ASSESSMENT / PLAN:  Acute respiratory failure with hypoxemia but not ARDS as she doesn't have diffuse bilateral airspace disease - s/p extubation 9/5 Presumably COPD w/ exacerbation Severe CAP (NOS) Atelectasis, left > right P:   Continue supplemental O2 as needed to maintain SpO2 > 92% Continue bronchial hygiene Push IS Mobilize CXR intermittently  Baseline Bipolar P:   Cont preadmission fluoxetine, ziprasidone, gabapentin, suboxone. Continue preadmission diazepam at half dose and PRN only for now, titrate to scheduled as needed (on TID at home) D/c seroquel and clonazepam  Intermittent hypertension  P:  Hydralazine PRN, labetalol PRN  Inadequate oral intake in setting of critical illness.  P:   Start diet  Hyperglycemia   P: SSI  Anemia without bleeding P:  Trend cbc and cont [place] heparin   Weakness P: PT consult to ensure safety with mobilization   Lines:  Remove CVL and foley today 9/6.  Saline lock PIV.  Transfer out of ICU to med surge.  Will ask TRH to assume care starting in AM 9/7 and PCCM off at that time.   FAMILY  - Updates: updated sister at bedside on 9/1  - Inter-disciplinary family meet or Palliative Care meeting due by:  day 7   Rutherford Guys, [place] - [facility] [place] [facility] Pager: [phone]  or [phone] 11/06/2016, 10:10 AM

## 2016-11-06 NOTE — Progress Notes (Signed)
Pt transferred to 5W08. Pt A&O x4. Call bell within reach. Will continue to assess.

## 2016-11-06 NOTE — Progress Notes (Signed)
Nutrition Follow-up  DOCUMENTATION CODES:   Obesity unspecified  INTERVENTION:    Continue Heart Healthy diet, RD to monitor intake and add supplements as needed.  NUTRITION DIAGNOSIS:   Inadequate oral intake related to lethargy/confusion as evidenced by meal completion < 50%.  Ongoing  GOAL:   Patient will meet greater than or equal to 90% of their needs  Unmet  MONITOR:   PO intake, Labs  REASON FOR ASSESSMENT:   Consult Enteral/tube feeding initiation and management  ASSESSMENT:   41 yo female with PMH of HLD, ADHD, bipolar D/O, asthma, COPD, seizures, fibromyalgia, PTSD, DDD, stroke who was admitted to [facility] on 8/28 with CAP, required intubation on 8/30, and [facility] 8/31 with ARDS, CAP, and sepsis.   Discussed patient in ICU rounds and with RN today. Patient was extubated on 9/5. Diet has been advanced to heart healthy. Patient threw an icee on the walls of her room this morning. Medications for her psych issues are being resumed. Labs and medications reviewed.  Diet Order:  Diet Heart Room service appropriate? Yes; Fluid consistency: Thin  Skin:  Reviewed, no issues  Last BM:  9/5  Height:   Ht Readings from Last 1 Encounters:  10/31/16 5\' 7"  (1.702 m)    Weight:   Wt Readings from Last 1 Encounters:  11/06/16 215 lb 13.3 oz (97.9 kg)    Ideal Body Weight:  61.4 kg  BMI:  Body mass index is 33.8 kg/m.  Estimated Nutritional Needs:   Kcal:  1800-2000  Protein:  100 gm  Fluid:  2 L  EDUCATION NEEDS:   No education needs identified at this time  Latasha [name], RD, LDN, CNSC Pager [phone] After Hours Pager [phone]

## 2016-11-06 NOTE — Progress Notes (Signed)
Pt transferred to 5W bed 8.  Report called to Brien MatesEvan Cheek RN [phone]. Will monitor.

## 2016-11-07 DIAGNOSIS — F1121 Opioid dependence, in remission: Secondary | ICD-10-CM

## 2016-11-07 DIAGNOSIS — R030 Elevated blood-pressure reading, without diagnosis of hypertension: Secondary | ICD-10-CM

## 2016-11-07 DIAGNOSIS — E876 Hypokalemia: Secondary | ICD-10-CM

## 2016-11-07 LAB — BASIC METABOLIC PANEL
Anion gap: 11 (ref 5–15)
BUN: 18 mg/dL (ref 6–20)
CHLORIDE: 101 mmol/L (ref 101–111)
CO2: 27 mmol/L (ref 22–32)
CREATININE: 0.57 mg/dL (ref 0.44–1.00)
Calcium: 9.2 mg/dL (ref 8.9–10.3)
Glucose, Bld: 96 mg/dL (ref 65–99)
POTASSIUM: 3.3 mmol/L — AB (ref 3.5–5.1)
SODIUM: 139 mmol/L (ref 135–145)

## 2016-11-07 LAB — GLUCOSE, CAPILLARY
GLUCOSE-CAPILLARY: 136 mg/dL — AB (ref 65–99)
Glucose-Capillary: 115 mg/dL — ABNORMAL HIGH (ref 65–99)
Glucose-Capillary: 154 mg/dL — ABNORMAL HIGH (ref 65–99)
Glucose-Capillary: 188 mg/dL — ABNORMAL HIGH (ref 65–99)
Glucose-Capillary: 134 mg/dL — ABNORMAL HIGH (ref 65–99)

## 2016-11-07 LAB — CBC
HEMATOCRIT: 37.6 % (ref 36.0–46.0)
Hemoglobin: 12.5 g/dL (ref 12.0–15.0)
MCH: 28.9 pg (ref 26.0–34.0)
MCHC: 33.2 g/dL (ref 30.0–36.0)
MCV: 86.8 fL (ref 78.0–100.0)
PLATELETS: 233 10*3/uL (ref 150–400)
RBC: 4.33 MIL/uL (ref 3.87–5.11)
RDW: 13.9 % (ref 11.5–15.5)
WBC: 10.4 10*3/uL (ref 4.0–10.5)

## 2016-11-07 MED ORDER — POTASSIUM CHLORIDE CRYS ER 20 MEQ PO TBCR
40.0000 meq | EXTENDED_RELEASE_TABLET | ORAL | Status: AC
  Administered 2016-11-07 (×2): 40 meq via ORAL
  Filled 2016-11-07 (×2): qty 2

## 2016-11-07 NOTE — Progress Notes (Signed)
PROGRESS NOTE [facility]   Latasha [name]   ZOX:[id number] DOB: [birth year]  DOA: 10/31/2016 PCP: No primary care provider on file.   Brief Narrative:  Latasha [name] 41 year old female with medical history ofasthma, COPD, bipolar disorder, ADHD,seizures, chronic joint pain and stroke. Patient was admitted on 8/[facility] to [facility] health carewith chief complaint of fever, shortness of breath and chest pain. Initial diagnosis was community-acquired pneumonia, subsequently respiratory status declined and ultimately required intubation on 8/30 with chest x-ray showing patchy bilateral airspace disease.patient was transferred to [facility] for possible ARDS and sepsis. During ICU stay patient developed worsening hypercapnia and vent dyssynchrony requiring paralytics. Patient was continued on IV antibiotics and subsequently was extubated on 9/5. Patient transferred to [facility] for further monitoring. The night of the 9/6 Rapid response was called as patient was lethargic, she recovered after receiving a dose of narcan. Patient now clinically stable.   Subjective: Patient seen and examined, she has no complaints today, she report that she was upset last night because she wanted to go home. Patient remains afebrile and tolerating diet well.   Assessment & Plan: Acute respiratory failure with hypoxia and hypercapnia - due to sepsis from pneumonia, also suspected COPD component Patient completed course of antibiotic with azithromycin and ceftriaxone Initially intubated with vent dyssynchrony that required paralytics, successfully extubated on 9/5 Wean O2 as tolerated to maintain SpO2 above 92% Check pulse ox with ambulation  OOB and ambulation as tolerated   Bipolar d/o  Stable  Continue PTA medication fluoxetine, Geodon, and gabapentin Continue diazepam - patient was started on titrating dose  Seroquel and Klonopin d/ced   Elevated blood pressures without diagnosis of  hypertension Patient was treated with IV hydralazine and labetalol as needed BP has remained stable for the past 48 hours  Hypokalemia  Replete  Check BMP and mag in AM   Opioid dependence  On Suboxone   DVT prophylaxis: Hep Sq Code Status: FULL Family Communication: None at bedside  Disposition Plan: Home in next 24 hrs  Consultants:   None   Procedures:   R IJ 8/31-9/6  Antimicrobials: Anti-infectives    Start     Dose/Rate Route Frequency Ordered Stop   10/31/16 2200  azithromycin (ZITHROMAX) 500 mg in dextrose 5 % 250 mL IVPB  Status:  Discontinued     500 mg 250 mL/hr over 60 Minutes Intravenous Every 24 hours 10/31/16 1306 11/03/16 1120   10/31/16 1800  cefTRIAXone (ROCEPHIN) 1 g in dextrose 5 % 50 mL IVPB     1 g 100 mL/hr over 30 Minutes Intravenous Every 24 hours 10/31/16 1337 11/04/16 1823      Objective: Vitals:   11/07/16 0939 11/07/16 0940 11/07/16 1422 11/07/16 1457  BP:    129/75  Pulse:    94  Resp:    18  Temp:    98.2 F (36.8 C)  TempSrc:    Oral  SpO2: 91% 91% 95% 95%  Weight:      Height:        Intake/Output Summary (Last 24 hours) at 11/07/16 1640 Last data filed at 11/07/16 1457  Gross per 24 hour  Intake              697 ml  Output                0 ml  Net              697 ml   [employer]  11/05/16 0421 11/06/16 0500 11/07/16 0500  Weight: 101.6 kg (223 lb 15.8 oz) 97.9 kg (215 lb 13.3 oz) 84.6 kg (186 lb 6.4 oz)    Examination:  General exam: NAD HEENT: OP moist and clear Respiratory system: Clear to auscultation. No wheezes,crackle or rhonchi Cardiovascular system: S1 & S2 heard, RRR. No JVD, murmurs, rubs or gallops Gastrointestinal system: Abdomen is nondistended, soft and nontender.  Central nervous system: Alert and oriented. No focal neurological deficits. Extremities: No pedal edema.  Skin: No rashes, lesions or ulcers  Psychiatry: No SI/HI, Mood appropriate   Data Reviewed: I have personally reviewed  following labs and imaging studies  CBC:  Recent Labs Lab 11/02/16 0430 11/03/16 0530 11/04/16 0405 11/05/16 0430 11/06/16 0459 11/07/16 0638  WBC 12.8* 8.9 9.5 10.1 9.7 10.4  NEUTROABS 8.8* 5.2  --   --   --   --   HGB 11.4* 10.7* 11.4* 11.4* 11.6* 12.5  HCT 34.9* 34.6* 36.3 36.7 36.3 37.6  MCV 87.9 92.5 91.9 91.5 90.3 86.8  PLT 199 193 201 226 228 233   Basic Metabolic Panel:  Recent Labs Lab 11/01/16 0306 11/01/16 1850  11/03/16 0530 11/04/16 0405 11/05/16 0430 11/06/16 0459 11/07/16 0638  NA 138  --   < > 144 145 146* 143 139  K 3.0*  --   < > 3.5 4.3 3.8 3.6 3.3*  CL 97*  --   < > 98* 99* 102 103 101  CO2 31  --   < > 37* 38* 35* 32 27  GLUCOSE 165*  --   < > 111* 140* 127* 116* 96  BUN 18  --   < > 20 26* 34* 26* 18  CREATININE 0.88  --   < > 0.66 0.62 0.80 0.58 0.57  CALCIUM 8.4*  --   < > 8.9 9.1 9.2 9.3 9.2  MG 2.5* 2.6*  --   --   --  2.4  --   --   PHOS 3.0 4.3  --   --   --  3.7  --   --   < > = values in this interval not displayed. GFR: Estimated Creatinine Clearance: 104.5 mL/min (by C-G formula based on SCr of 0.57 mg/dL). Liver Function Tests:  Recent Labs Lab 11/04/16 0405 11/06/16 0459  AST 24 38  ALT 27 26  ALKPHOS 70 60  BILITOT 0.5 0.7  PROT 6.5 6.9  ALBUMIN 2.9* 3.0*   No results for input(s): LIPASE, AMYLASE in the last 168 hours. No results for input(s): AMMONIA in the last 168 hours. Coagulation Profile: No results for input(s): INR, PROTIME in the last 168 hours. Cardiac Enzymes: No results for input(s): CKTOTAL, CKMB, CKMBINDEX, TROPONINI in the last 168 hours. BNP (last 3 results) No results for input(s): PROBNP in the last 8760 hours. HbA1C: No results for input(s): HGBA1C in the last 72 hours. CBG:  Recent Labs Lab 11/06/16 1542 11/06/16 2114 11/06/16 2220 11/07/16 0753 11/07/16 1239  GLUCAP 86 105* 93 115* 136*   Lipid Profile:  Recent Labs  11/04/16 2157  TRIG 412*   Thyroid Function Tests: No  results for input(s): TSH, T4TOTAL, FREET4, T3FREE, THYROIDAB in the last 72 hours. Anemia Panel: No results for input(s): VITAMINB12, FOLATE, FERRITIN, TIBC, IRON, RETICCTPCT in the last 72 hours. Sepsis Labs:  Recent Labs Lab 11/01/16 0306 11/01/16 0307 11/01/16 1200 11/02/16 0430 11/05/16 1030 11/06/16 0459  PROCALCITON 1.19  --   --  1.94 0.36 0.21  LATICACIDVEN  --  2.7* 2.3*  --   --   --     Recent Results (from the past 240 hour(s))  MRSA PCR Screening     Status: None   Collection Time: 10/31/16 12:29 PM  Result Value Ref Range Status   MRSA by PCR NEGATIVE NEGATIVE Final    Comment:        The GeneXpert MRSA Assay (FDA approved for NASAL specimens only), is one component of a comprehensive MRSA colonization surveillance program. It is not intended to diagnose MRSA infection nor to guide or monitor treatment for MRSA infections.   Culture, respiratory (NON-Expectorated)     Status: None   Collection Time: 10/31/16  1:06 PM  Result Value Ref Range Status   Specimen Description TRACHEAL ASPIRATE  Final   Special Requests NONE  Final   Gram Stain   Final    NO SQUAMOUS EPITHELIAL CELLS SEEN RARE WBC PRESENT,BOTH PMN AND MONONUCLEAR NO ORGANISMS SEEN    Culture NO GROWTH 2 DAYS  Final   Report Status 11/02/2016 FINAL  Final  Culture, blood (routine x 2)     Status: None   Collection Time: 10/31/16  1:21 PM  Result Value Ref Range Status   Specimen Description BLOOD RIGHT HAND  Final   Special Requests IN PEDIATRIC BOTTLE Blood Culture adequate volume  Final   Culture NO GROWTH 5 DAYS  Final   Report Status 11/05/2016 FINAL  Final  Culture, blood (routine x 2)     Status: Abnormal   Collection Time: 10/31/16  1:21 PM  Result Value Ref Range Status   Specimen Description BLOOD LEFT HAND  Final   Special Requests IN PEDIATRIC BOTTLE Blood Culture adequate volume  Final   Culture  Setup Time   Final    GRAM POSITIVE COCCI IN CLUSTERS IN PEDIATRIC  BOTTLE CRITICAL RESULT CALLED TO, READ BACK BY AND VERIFIED WITH: B MANCHERIL,PHARMD AT 1220 11/01/16 BY L BENFIELD    Culture (A)  Final    STAPHYLOCOCCUS SPECIES (COAGULASE NEGATIVE) THE SIGNIFICANCE OF ISOLATING THIS ORGANISM FROM A SINGLE SET OF BLOOD CULTURES WHEN MULTIPLE SETS ARE DRAWN IS UNCERTAIN. PLEASE NOTIFY THE MICROBIOLOGY DEPARTMENT WITHIN ONE WEEK IF SPECIATION AND SENSITIVITIES ARE REQUIRED.    Report Status 11/03/2016 FINAL  Final  Blood Culture ID Panel (Reflexed)     Status: Abnormal   Collection Time: 10/31/16  1:21 PM  Result Value Ref Range Status   Enterococcus species NOT DETECTED NOT DETECTED Final   Listeria monocytogenes NOT DETECTED NOT DETECTED Final   Staphylococcus species DETECTED (A) NOT DETECTED Final    Comment: Methicillin (oxacillin) susceptible coagulase negative staphylococcus. Possible blood culture contaminant (unless isolated from more than one blood culture draw or clinical case suggests pathogenicity). No antibiotic treatment is indicated for blood  culture contaminants. CRITICAL RESULT CALLED TO, READ BACK BY AND VERIFIED WITH: B MANCHERIL,PHARMD AT 1220 11/01/16 BY L BENFIELD    Staphylococcus aureus NOT DETECTED NOT DETECTED Final   Methicillin resistance NOT DETECTED NOT DETECTED Final   Streptococcus species NOT DETECTED NOT DETECTED Final   Streptococcus agalactiae NOT DETECTED NOT DETECTED Final   Streptococcus pneumoniae NOT DETECTED NOT DETECTED Final   Streptococcus pyogenes NOT DETECTED NOT DETECTED Final   Acinetobacter baumannii NOT DETECTED NOT DETECTED Final   Enterobacteriaceae species NOT DETECTED NOT DETECTED Final   Enterobacter cloacae complex NOT DETECTED NOT DETECTED Final   Escherichia coli NOT DETECTED NOT DETECTED Final   Klebsiella oxytoca  NOT DETECTED NOT DETECTED Final   Klebsiella pneumoniae NOT DETECTED NOT DETECTED Final   Proteus species NOT DETECTED NOT DETECTED Final   Serratia marcescens NOT DETECTED NOT  DETECTED Final   Haemophilus influenzae NOT DETECTED NOT DETECTED Final   Neisseria meningitidis NOT DETECTED NOT DETECTED Final   Pseudomonas aeruginosa NOT DETECTED NOT DETECTED Final   Candida albicans NOT DETECTED NOT DETECTED Final   Candida glabrata NOT DETECTED NOT DETECTED Final   Candida krusei NOT DETECTED NOT DETECTED Final   Candida parapsilosis NOT DETECTED NOT DETECTED Final   Candida tropicalis NOT DETECTED NOT DETECTED Final  Respiratory Panel by PCR     Status: None   Collection Time: 10/31/16  2:15 PM  Result Value Ref Range Status   Adenovirus NOT DETECTED NOT DETECTED Final   Coronavirus 229E NOT DETECTED NOT DETECTED Final   Coronavirus HKU1 NOT DETECTED NOT DETECTED Final   Coronavirus NL63 NOT DETECTED NOT DETECTED Final   Coronavirus OC43 NOT DETECTED NOT DETECTED Final   Metapneumovirus NOT DETECTED NOT DETECTED Final   Rhinovirus / Enterovirus NOT DETECTED NOT DETECTED Final   Influenza A NOT DETECTED NOT DETECTED Final   Influenza B NOT DETECTED NOT DETECTED Final   Parainfluenza Virus 1 NOT DETECTED NOT DETECTED Final   Parainfluenza Virus 2 NOT DETECTED NOT DETECTED Final   Parainfluenza Virus 3 NOT DETECTED NOT DETECTED Final   Parainfluenza Virus 4 NOT DETECTED NOT DETECTED Final   Respiratory Syncytial Virus NOT DETECTED NOT DETECTED Final   Bordetella pertussis NOT DETECTED NOT DETECTED Final   Chlamydophila pneumoniae NOT DETECTED NOT DETECTED Final   Mycoplasma pneumoniae NOT DETECTED NOT DETECTED Final     Radiology Studies: Dg Chest Port 1 View  Result Date: 11/06/2016 CLINICAL DATA:  Acute respiratory failure, pneumonia, current smoker. EXAM: PORTABLE CHEST 1 VIEW COMPARISON:  Portable chest x-ray of November 04, 2016 FINDINGS: The trachea and esophagus have been extubated. There has been interval improvement in the appearance of the right lung base. On the left increased density persists and the hemidiaphragm remains obscured. The cardiac  silhouette is mildly enlarged. The pulmonary vascularity is mildly engorged. The mediastinum is normal in width. The right internal jugular venous catheter tip projects over the midportion of the SVC. IMPRESSION: Persistent left lower lobe atelectasis or pneumonia. Improving right basilar atelectasis or infiltrate. Mild pulmonary vascular engorgement is observed but has decreased. Electronically Signed   By: David  [place] M.D.   On: 11/06/2016 07:19    Scheduled Meds: . arformoterol  15 mcg Nebulization Q12H  . benztropine  0.5 mg Oral BID  . budesonide (PULMICORT) nebulizer solution  0.5 mg Nebulization BID  . buprenorphine-naloxone  1 tablet Sublingual BID  . Chlorhexidine Gluconate Cloth  6 each Topical Q0600  . FLUoxetine  40 mg Oral Daily  . gabapentin  800 mg Oral TID  . heparin subcutaneous  5,000 Units Subcutaneous Q8H  . insulin aspart  0-15 Units Subcutaneous TID WC  . insulin aspart  0-5 Units Subcutaneous QHS  . ipratropium-albuterol  3 mL Nebulization Q6H  . mouth rinse  15 mL Mouth Rinse BID  . sodium chloride flush  3 mL Intravenous Q12H  . ziprasidone  80 mg Oral BID WC   Continuous Infusions: . sodium chloride       LOS: 7 days    Time spent: Total of 25 minutes spent with pt, greater than 50% of which was spent in discussion of  treatment, counseling and coordination  of care   Latrelle Dodrill, MD Pager: Text Page via [email or web address]   If 7PM-7AM, please contact night-coverage [email or web address] 11/07/2016, 4:40 PM

## 2016-11-07 NOTE — Progress Notes (Signed)
SATURATION QUALIFICATIONS: (This note is used to comply with regulatory documentation for home oxygen)  Patient Saturations on Room Air at Rest = low of 84%  Patient Saturations on Room Air while Ambulating = 83-85%  Patient Saturations on 4 Liters of oxygen while Ambulating = 91%  Please briefly explain why patient needs home oxygen: Only with supplement oxygen can pt maintain sats over 90%. 11/07/2016  Epworth BingKen Lai Hendriks, PT [phone]  (pager)11/07/2016  Sharon BingKen Zeya Balles, PT [phone]  (pager)

## 2016-11-07 NOTE — Consult Note (Signed)
[facility] CM Primary Care Navigator  11/07/2016  Latasha [name] [birth year] [id number]   Met with patient at the bedside to identify possible discharge needs. Patient reports having shortness of breath, fever and "passed out" that hadled to this admission. Patient endorses Dr. Blanchie Serve with [facility] as her primary care provider whom she was suppose to see on 9/4 to establish care but she ended up being in the hospital that required intubation.Patient aware to call primary care provider's office to reschedule appointment and set-up a post hospital follow-up visit after discharge.   Patient states using [facility] in [place] to obtain medications without any problem.   Patient reports managing her own medications at home using "pill box"systemfilled weekly.   Patient uses medical transportation Dance movement psychotherapist) to her doctors' appointments.  Patient lives with her cousins in [place]. Her boyfriend Sonia Side), aunt Daine Floras), cousins Rojelio Brenner and Chrissie Noa) will be able to assist with her care needs at homeas stated.  Discharge plan is home according to patient. Per Inpatient social worker note, patient's mother (works at [employer] as Therapist, sports) is hopeful for patient's return to home when medically stable.  Patient expressed understanding to call primary care provider's office for a post discharge follow-up appointment within a week or sooner if needs arise.Patient letter (with PCP's contact number) was provided as her reminder.  Explained to patient regarding[facility] CM services available for health management and she communicated no current needs  or concerns at this time.  Patient had only opted and verbally agreed with [facility] tofollow-up with her recovery at home.   Referral to [facility] made to follow-up patient after discharge.  Patientverbalized understanding to [facility] primary care provider to [facility] care  management services asdeemed necessary in the future.  [facility] care management information provided for future needs that she may have.   For questions, please contact:  Dannielle Huh, BSN, RN- [facility] Primary Care Navigator  Telephone: [phone] [place]

## 2016-11-07 NOTE — Evaluation (Signed)
Physical Therapy Evaluation Patient Details Name: Latasha [name] MRN: [id number] DOB: 02-15-1976 Today's Date: 11/07/2016   History of Present Illness  pt is a 41 y/o female with pmh significant for ADHD, Anxiety, bipolar d/o, COPD, DDD, PTSD, stroke, sciatica, admitted with SOB, CP and fever due to sepsis, CAP and ARDS.  Placed on vent.  Extubated 9/2.  Clinical Impression  Pt admitted with/for s/s suggestive of CAP, ARDS.  Pt at min to supervision level for mobility and in need of supplemental oxygen. See O2 qualification.  Pt currently limited functionally due to the problems listed below.  (see problems list.)  Pt will benefit from PT to maximize function and safety to be able to get home safely with available assist family.     Follow Up Recommendations Home health PT    Equipment Recommendations  None recommended by PT    Recommendations for Other Services       Precautions / Restrictions Precautions Precautions: Fall;Other (comment) (monitor O2)      Mobility  Bed Mobility Overal bed mobility: Needs Assistance Bed Mobility: Supine to Sit;Sit to Supine     Supine to sit: Supervision Sit to supine: Supervision      Transfers Overall transfer level: Needs assistance   Transfers: Sit to/from Stand Sit to Stand: Min guard         General transfer comment: safe in general  Ambulation/Gait Ambulation/Gait assistance: Min assist;Min guard Ambulation Distance (Feet): 140 Feet Assistive device: None;1 person hand held assist (rail) Gait Pattern/deviations: Step-through pattern Gait velocity: slower Gait velocity interpretation: Below normal speed for age/gender General Gait Details: mildly unsteady overall with several minor staggers to try to regain balance  Stairs            Wheelchair Mobility    Modified Rankin (Stroke Patients Only)       Balance Overall balance assessment: Needs assistance Sitting-balance support: No upper extremity  supported Sitting balance-Leahy Scale: Good     Standing balance support: No upper extremity supported;Single extremity supported Standing balance-Leahy Scale: Fair Standing balance comment: can not accept challenge, but can maintain static stance.                             Pertinent Vitals/Pain Pain Assessment: No/denies pain    Home Living Family/patient expects to be discharged to:: Private residence Living Arrangements: Other relatives (multiple adult relatives, children and her boyfriend) Available Help at Discharge: Family;Available 24 hours/day Type of Home: House Home Access: Stairs to enter Entrance Stairs-Rails: Doctor, general practice of Steps: 4 Home Layout: One level Home Equipment: Cane - single point;Shower seat      Prior Function Level of Independence: Independent with assistive device(s)               Hand Dominance        Extremity/Trunk Assessment   Upper Extremity Assessment Upper Extremity Assessment: Defer to OT evaluation    Lower Extremity Assessment Lower Extremity Assessment: Overall WFL for tasks assessed (bil quads >4/5 and proximal weakness)    Cervical / Trunk Assessment Cervical / Trunk Assessment: Kyphotic  Communication   Communication: No difficulties  Cognition Arousal/Alertness: Awake/alert Behavior During Therapy: WFL for tasks assessed/performed Overall Cognitive Status: Within Functional Limits for tasks assessed  General Comments General comments (skin integrity, edema, etc.): At rest on RA, sats 84-90% HR 93 bpm, During gait on RA sats dropped and sustained at 83-85% at 118 bpm.  Placed on 4L and ambulated with sats at 91-93% at 111-119 bpm    Exercises     Assessment/Plan    PT Assessment Patient needs continued PT services  PT Problem List Decreased strength;Decreased activity tolerance;Decreased balance;Decreased  mobility;Cardiopulmonary status limiting activity;Decreased knowledge of use of DME       PT Treatment Interventions DME instruction;Gait training;Stair training;Functional mobility training;Balance training;Patient/family education    PT Goals (Current goals can be found in the Care Plan section)  Acute Rehab PT Goals Patient Stated Goal: home soon PT Goal Formulation: With patient Time For Goal Achievement: 11/14/16 Potential to Achieve Goals: Good    Frequency Min 3X/week   Barriers to discharge        Co-evaluation               AM-PAC PT "6 Clicks" Daily Activity  Outcome Measure Difficulty turning over in bed (including adjusting bedclothes, sheets and blankets)?: None Difficulty moving from lying on back to sitting on the side of the bed? : None Difficulty sitting down on and standing up from a chair with arms (e.g., wheelchair, bedside commode, etc,.)?: None Help needed moving to and from a bed to chair (including a wheelchair)?: A Little Help needed walking in hospital room?: A Little Help needed climbing 3-5 steps with a railing? : A Little 6 Click Score: 21    End of Session Equipment Utilized During Treatment: Oxygen Activity Tolerance: Patient tolerated treatment well;Patient limited by fatigue Patient left: in bed;with call bell/phone within reach Nurse Communication: Mobility status PT Visit Diagnosis: Unsteadiness on feet (R26.81);Other abnormalities of gait and mobility (R26.89);Difficulty in walking, not elsewhere classified (R26.2)    Time: [id number] PT Time Calculation (min) (ACUTE ONLY): 31 min   Charges:   PT Evaluation $PT Eval Moderate Complexity: 1 Mod PT Treatments $Gait Training: 8-22 mins   PT G Codes:        11/07/2016  Muldrow BingKen Denna Fryberger, PT [phone]  (pager)  Eliseo GumKenneth V Lowen [place] 11/07/2016, 5:28 PM

## 2016-11-07 NOTE — Progress Notes (Addendum)
SATURATION QUALIFICATIONS: (This note is used to comply with regulatory documentation for home oxygen)  Patient Saturations on Room Air at Rest = 85%  Patient Saturations on Room Air while Ambulating = 85%  Patient Saturations on 4 Liters of oxygen while Ambulating = **94*%  Please briefly explain why patient needs home oxygen: pt oxygen % dropped to 85 without 02 while ambulating and pulse oximetry went up to 110 but was down to 90 while resting. Albuterol was a method that was tried and failed.

## 2016-11-07 NOTE — Care Management Important Message (Signed)
Important Message  Patient Details  Name: Latasha [name] MRN: [id number] Date of Birth: 09/28/1975   Medicare Important Message Given:  Yes    [name] 11/07/2016, 9:45 AM

## 2016-11-08 LAB — GLUCOSE, CAPILLARY
Glucose-Capillary: 116 mg/dL — ABNORMAL HIGH (ref 65–99)
Glucose-Capillary: 113 mg/dL — ABNORMAL HIGH (ref 65–99)

## 2016-11-08 MED ORDER — IPRATROPIUM-ALBUTEROL 0.5-2.5 (3) MG/3ML IN SOLN
3.0000 mL | Freq: Three times a day (TID) | RESPIRATORY_TRACT | Status: DC
  Administered 2016-11-08: 3 mL via RESPIRATORY_TRACT
  Filled 2016-11-08: qty 3

## 2016-11-08 MED ORDER — DIAZEPAM 10 MG PO TABS: 5.0000 mg | ORAL_TABLET | Freq: Three times a day (TID) | ORAL | 0 refills | Status: DC | PRN

## 2016-11-08 NOTE — Care Management Note (Signed)
Case Management Note  Patient Details  Name: Latasha [name] MRN: [id number] Date of Birth: 05/30/1975  Subjective/Objective:  41 y.o. F to be discharged with Home Oxygen. CM alerted [facility] and Vaughan BastaJermaine will make sure she has this prior to discharge.                   Action/Plan:CM will sign off for now but will be available should additional discharge needs arise or disposition change.    Expected Discharge Date:  11/08/16               Expected Discharge Plan:  Home/Self Care  In-House Referral:     Discharge planning Services  CM Consult  Post Acute Care Choice:    Choice offered to:  Patient  DME Arranged:  Oxygen DME Agency:  [facility]  HH Arranged:  NA HH Agency:  NA  Status of Service:  Completed, signed off  If discussed at Long Length of Stay Meetings, dates discussed:    Additional Comments:  Latasha [name], Latasha Nanna M, RN 11/08/2016, 12:53 PM

## 2016-11-08 NOTE — Discharge Summary (Signed)
Physician Discharge Summary  Latasha [name]  ZOX:[id number]  DOB: 27-Aug-1975  DOA: 10/31/2016 PCP: No primary care provider on file.  Admit date: 10/31/2016 Discharge date: 11/08/2016  Admitted From: Home  Disposition:  Home   Recommendations for Outpatient Follow-up:  1. Follow up with PCP in 1 weeks 2. Please obtain BMP in one week to monitor potassium.  3. Repeat CXR in 3-4 weeks to reassure resolution of radiological findings  4. Recommend referral to pulmonologist   Home Health PT  Equipment/Devices: O2 2L Hansen   Discharge Condition: Stable  CODE STATUS: FULL  Diet recommendation: Heart Healthy   Brief/Interim Summary: Latasha [name] 41 year old female with medical history ofasthma, COPD, bipolar disorder, ADHD,seizures, chronic joint pain and stroke. Patient was admitted on 8/28 to [facility] health carewith chief complaint of fever, shortness of breath and chest pain. Initial diagnosis was community-acquired pneumonia, subsequently respiratory status declined and ultimately required intubation on 8/30 with chest x-ray showing patchy bilateral airspace disease.patient was transferred to [facility] for possible ARDS and sepsis. During ICU stay patient developed worsening hypercapnia and vent dyssynchrony requiring paralytics. Patient was continued on IV antibiotics and subsequently was extubated on 9/5. Patient transferred to [facility] for further monitoring. The night of the 9/6 Rapid response was called as patient was lethargic, she recovered after receiving a dose of narcan. Patient now clinically stable. Pulse ox with ambulation was monitored and patient requires O2 at rest and with activity.Patient has completed antibiotic therapy and will be discharge home to follow with PCP     Subjective: Patient seen and examined on the day of discharge. She has no new complaints. No acute events overnight. Remains afebrile, breathing well on 2L nasal canula. Tolerating diet well and  ambulating with no issues.   Discharge Diagnoses/Hospital Course:  Acute respiratory failure with hypoxia and hypercapnia - due to sepsis from pneumonia, also suspected COPD component Patient completed course of antibiotic with azithromycin and ceftriaxone Initially intubated with vent dyssynchrony that required paralytics, successfully extubated on 9/5 Continue O2 supplementation until evaluated by PCP or pulmonary   Sepsis from PNA  Spesis physiology has resolve  Completed course of antibiotics   Bipolar d/o  Stable  Continue PTA medication fluoxetine, Geodon, and gabapentin Continue diazepam - patient was started on titrating dose - continue taper as outpatient  Seroquel and Klonopin d/ced  Follow up with PCP   Elevated blood pressures without diagnosis of hypertension - resolved  Patient was treated with IV hydralazine and labetalol as needed BP has remained stable for the past 48 hours, will not prescribe medications at this time  Defer to PCP for further monitoring   Hypokalemia  Repleted - check BMP in 1 week with PCP    Opioid dependence  On Suboxone  All other chronic medical condition were stable during the hospitalization.  Patient was seen by physical therapy, recommending home health PT  On the day of the discharge the patient's vitals were stable, and no other acute medical condition were reported by patient. Patient was felt safe to be discharge to home.   Discharge Instructions  You were cared for by a hospitalist during your hospital stay. If you have any questions about your discharge medications or the care you received while you were in the hospital after you are discharged, you can call the unit and asked to speak with the hospitalist on call if the hospitalist that took care of you is not available. Once you are discharged, your primary  care physician will handle any further medical issues. Please note that NO REFILLS for any discharge medications will be  authorized once you are discharged, as it is imperative that you return to your primary care physician (or establish a relationship with a primary care physician if you do not have one) for your aftercare needs so that they can reassess your need for medications and monitor your lab values.  Discharge Instructions    Call MD for:  difficulty breathing, headache or visual disturbances    Complete by:  As directed    Call MD for:  extreme fatigue    Complete by:  As directed    Call MD for:  hives    Complete by:  As directed    Call MD for:  persistant dizziness or light-headedness    Complete by:  As directed    Call MD for:  persistant nausea and vomiting    Complete by:  As directed    Call MD for:  redness, tenderness, or signs of infection (pain, swelling, redness, odor or green/yellow discharge around incision site)    Complete by:  As directed    Call MD for:  severe uncontrolled pain    Complete by:  As directed    Call MD for:  temperature >100.4    Complete by:  As directed    Diet - low sodium heart healthy    Complete by:  As directed    Increase activity slowly    Complete by:  As directed      Allergies as of 11/08/2016      Reactions   Aripiprazole Other (See Comments)   Jerks, Locks up jaws.    Cephalexin Nausea And Vomiting   Haldol [haloperidol Decanoate] Swelling, Other (See Comments)   Locks up jaws   Tegretol [carbamazepine] Other (See Comments)   Blisters, Sores   Zofran Other (See Comments)   Locks up jaw, restlessness    Ketorolac Tromethamine Rash      Medication List    STOP taking these medications   promethazine 25 MG tablet Commonly known as:  PHENERGAN     TAKE these medications   diazepam 10 MG tablet Commonly known as:  VALIUM Take 0.5 tablets (5 mg total) by mouth every 8 (eight) hours as needed for anxiety. What changed:  how much to take  when to take this  reasons to take this   FLUoxetine 40 MG capsule Commonly known as:   PROZAC Take 40 mg by mouth daily.   gabapentin 800 MG tablet Commonly known as:  NEURONTIN Take 800 mg by mouth 4 (four) times daily.   PROAIR HFA 108 (90 Base) MCG/ACT inhaler Generic drug:  albuterol Inhale 2 puffs into the lungs every 6 (six) hours as needed. Shortness of breath.   SUBOXONE 8-2 MG Film Generic drug:  Buprenorphine HCl-Naloxone HCl Take 1 Film by mouth 3 (three) times daily.   ziprasidone 80 MG capsule Commonly known as:  GEODON Take 80 mg by mouth 2 (two) times daily with a meal.            Discharge Care Instructions        Start     Ordered   11/08/16 0000  diazepam (VALIUM) 10 MG tablet  Every 8 hours PRN     11/08/16 1215   11/08/16 0000  Increase activity slowly     11/08/16 1215   11/08/16 0000  Diet - low sodium heart healthy  11/08/16 1215   11/08/16 0000  Call MD for:  temperature >100.4     11/08/16 1215   11/08/16 0000  Call MD for:  persistant nausea and vomiting     11/08/16 1215   11/08/16 0000  Call MD for:  severe uncontrolled pain     11/08/16 1215   11/08/16 0000  Call MD for:  redness, tenderness, or signs of infection (pain, swelling, redness, odor or green/yellow discharge around incision site)     11/08/16 1215   11/08/16 0000  Call MD for:  difficulty breathing, headache or visual disturbances     11/08/16 1215   11/08/16 0000  Call MD for:  hives     11/08/16 1215   11/08/16 0000  Call MD for:  persistant dizziness or light-headedness     11/08/16 1215   11/08/16 0000  Call MD for:  extreme fatigue     11/08/16 1215     Follow-up Information    Coralyn Helling, MD. Schedule an appointment as soon as possible for a visit in 2 week(s).   Specialty:  Pulmonary Disease Why:  Establish care  Contact information: [address] [place] [address] [phone]          Allergies  Allergen Reactions  . Aripiprazole Other (See Comments)    Jerks, Locks up jaws.   . Cephalexin Nausea And Vomiting  . Haldol  [Haloperidol Decanoate] Swelling and Other (See Comments)    Locks up jaws  . Tegretol [Carbamazepine] Other (See Comments)    Blisters, Sores  . Zofran Other (See Comments)    Locks up jaw, restlessness   . Ketorolac Tromethamine Rash    Consultations:  None    Procedures/Studies: Dg Chest Port 1 View  Result Date: 11/06/2016 CLINICAL DATA:  Acute respiratory failure, pneumonia, current smoker. EXAM: PORTABLE CHEST 1 VIEW COMPARISON:  Portable chest x-ray of November 04, 2016 FINDINGS: The trachea and esophagus have been extubated. There has been interval improvement in the appearance of the right lung base. On the left increased density persists and the hemidiaphragm remains obscured. The cardiac silhouette is mildly enlarged. The pulmonary vascularity is mildly engorged. The mediastinum is normal in width. The right internal jugular venous catheter tip projects over the midportion of the SVC. IMPRESSION: Persistent left lower lobe atelectasis or pneumonia. Improving right basilar atelectasis or infiltrate. Mild pulmonary vascular engorgement is observed but has decreased. Electronically Signed   By: David  [place] M.D.   On: 11/06/2016 07:19   Dg Chest Port 1 View  Result Date: 11/04/2016 CLINICAL DATA:  Hypoxia EXAM: PORTABLE CHEST 1 VIEW COMPARISON:  Study obtained earlier in the day FINDINGS: Endotracheal tube tip is 4.3 cm above the carina. Central catheter tip is in the superior vena cava. Nasogastric tube tip and side port below the diaphragm. No pneumothorax. There are small pleural effusions bilaterally with patchy bibasilar atelectasis. There is airspace consolidation in the left lower lobe. Heart is borderline enlarged with pulmonary vascularity within normal limits. No adenopathy. No bone lesions. IMPRESSION: Tube and catheter positions as described without evident pneumothorax. Bibasilar atelectasis with small pleural effusions bilaterally. Consolidation left lower lobe, concerning  for pneumonia. Stable cardiac silhouette. Electronically Signed   By: Bretta Bang III M.D.   On: 11/04/2016 21:51   Dg Chest Port 1 View  Result Date: 11/04/2016 CLINICAL DATA:  41 year old female with pneumonia. Subsequent encounter. EXAM: PORTABLE CHEST 1 VIEW COMPARISON:  11/03/2016. FINDINGS: Endotracheal tube tip 5.8 cm above  the carina. Right central line tip proximal superior vena cava level. Nasogastric tube courses below the diaphragm. Tip is not included on the present exam. Poor inspiration with bibasilar parenchymal changes suspicious for atelectasis although pneumonia not excluded. Appearance unchanged. Pulmonary vascular prominence without pulmonary edema. No pneumothorax. Mediastinal and cardiac silhouette unchanged. IMPRESSION: Poor inspiratory exam with bibasilar parenchymal changes similar to prior examination suggestive of atelectasis although pneumonia not excluded. Electronically Signed   By: Lacy Duverney M.D.   On: 11/04/2016 07:10   Dg Chest Port 1 View  Result Date: 11/03/2016 CLINICAL DATA:  Acute respiratory failure. EXAM: PORTABLE CHEST 1 VIEW COMPARISON:  One-view chest x-ray 11/02/2016 FINDINGS: Endotracheal tube and the right IJ line are stable. NG tube courses off the inferior border the film. Heart size is exaggerated by low lung volumes. Interstitial edema and bilateral effusions are stable. Bibasilar airspace disease likely reflects atelectasis. IMPRESSION: 1. No significant interval change and low lung volumes, mild edema and bilateral effusions. 2. Bibasilar airspace disease likely reflects atelectasis. 3. Support apparatus is stable. Electronically Signed   By: Marin Roberts M.D.   On: 11/03/2016 07:32   Dg Chest Port 1 View  Result Date: 11/02/2016 CLINICAL DATA:  Acute respiratory failure. EXAM: PORTABLE CHEST 1 VIEW COMPARISON:  One-view chest x-ray 11/01/2016 FINDINGS: The patient is slightly rotated to the left. Endotracheal tube and right IJ line are  stable. The NG tube courses off the inferior border of the film. Heart size is exaggerated by low lung volumes. Mild pulmonary vascular congestion is again noted. Bilateral pleural effusions and basilar airspace disease is unchanged. IMPRESSION: 1. Slight increase in pulmonary vascular congestion. 2. Stable bilateral pleural effusions and lower lobe airspace disease. While this likely reflects atelectasis, infection is not excluded. 3. The support apparatus is stable. Electronically Signed   By: Marin Roberts M.D.   On: 11/02/2016 07:12   Dg Chest Port 1 View  Result Date: 11/01/2016 CLINICAL DATA:  Ventilator dependent. EXAM: PORTABLE CHEST 1 VIEW COMPARISON:  One-view chest x-ray 10/31/2016 FINDINGS: Endotracheal tube, right IJ line, and NG tube are stable. The heart size is normal. Aeration is slightly improved. Bibasilar airspace disease and effusions are again noted. Mild pulmonary vascular congestion is improved. IMPRESSION: 1. Improving aeration in decreased pulmonary vascular congestion. 2. Bilateral effusions and lower lobe airspace disease is also improved. This likely reflects atelectasis. Airspace disease is not excluded. 3. The support apparatus is stable. Electronically Signed   By: Marin Roberts M.D.   On: 11/01/2016 08:06   Dg Chest Port 1 View  Result Date: 10/31/2016 CLINICAL DATA:  Central line placement. EXAM: PORTABLE CHEST 1 VIEW COMPARISON:  Earlier today. FINDINGS: Interval right jugular catheter with its tip in the superior vena cava. No pneumothorax. Endotracheal tube in satisfactory position. Nasogastric tube tip and side hole in the proximal stomach. The cardiac silhouette remains borderline enlarged. No significant change in diffuse prominence of the pulmonary vasculature and interstitial markings and bibasilar linear and patchy opacities. IMPRESSION: 1. Right jugular catheter tip in the superior vena cava without pneumothorax. 2. No significant change in borderline  cardiomegaly and changes of congestive heart failure with bibasilar atelectasis and possible alveolar edema or pneumonia. Electronically Signed   By: Beckie Salts M.D.   On: 10/31/2016 14:54   Dg Chest Port 1 View  Result Date: 10/31/2016 CLINICAL DATA:  Confirm ET and orogastric tube placement. EXAM: PORTABLE CHEST 1 VIEW COMPARISON:  Chest x-ray from same day. FINDINGS: The endotracheal and  enteric tubes are unchanged in position. Cardiomegaly with increased pulmonary vascular congestion, interstitial edema, and bibasilar opacities. Small bilateral pleural effusions. No pneumothorax. IMPRESSION: 1. Stable lines and tubes. 2. No change in lower lobe predominant bilateral pulmonary opacities and edema. Electronically Signed   By: Obie Dredge M.D.   On: 10/31/2016 13:26    Discharge Exam: Vitals:   11/08/16 0437 11/08/16 0913  BP: 122/68   Pulse: 82   Resp: 16   Temp: 97.8 F (36.6 C)   SpO2: 96% 97%   Vitals:   11/07/16 2217 11/08/16 0437 11/08/16 0439 11/08/16 0913  BP: 125/71 122/68    Pulse: 82 82    Resp: 17 16    Temp: 98.1 F (36.7 C) 97.8 F (36.6 C)    TempSrc: Oral Oral    SpO2: 94% 96%  97%  Weight:   97.8 kg (215 lb 8 oz)   Height:        General: Pt is alert, awake, not in acute distress Cardiovascular: RRR, S1/S2 +, no rubs, no gallops Respiratory: CTA bilaterally, no wheezing, no rhonchi Abdominal: Soft, NT, ND, bowel sounds + Extremities: no edema, no cyanosis   The results of significant diagnostics from this hospitalization (including imaging, microbiology, ancillary and laboratory) are listed below for reference.     Microbiology: Recent Results (from the past 240 hour(s))  MRSA PCR Screening     Status: None   Collection Time: 10/31/16 12:29 PM  Result Value Ref Range Status   MRSA by PCR NEGATIVE NEGATIVE Final    Comment:        The GeneXpert MRSA Assay (FDA approved for NASAL specimens only), is one component of a comprehensive MRSA  colonization surveillance program. It is not intended to diagnose MRSA infection nor to guide or monitor treatment for MRSA infections.   Culture, respiratory (NON-Expectorated)     Status: None   Collection Time: 10/31/16  1:06 PM  Result Value Ref Range Status   Specimen Description TRACHEAL ASPIRATE  Final   Special Requests NONE  Final   Gram Stain   Final    NO SQUAMOUS EPITHELIAL CELLS SEEN RARE WBC PRESENT,BOTH PMN AND MONONUCLEAR NO ORGANISMS SEEN    Culture NO GROWTH 2 DAYS  Final   Report Status 11/02/2016 FINAL  Final  Culture, blood (routine x 2)     Status: None   Collection Time: 10/31/16  1:21 PM  Result Value Ref Range Status   Specimen Description BLOOD RIGHT HAND  Final   Special Requests IN PEDIATRIC BOTTLE Blood Culture adequate volume  Final   Culture NO GROWTH 5 DAYS  Final   Report Status 11/05/2016 FINAL  Final  Culture, blood (routine x 2)     Status: Abnormal   Collection Time: 10/31/16  1:21 PM  Result Value Ref Range Status   Specimen Description BLOOD LEFT HAND  Final   Special Requests IN PEDIATRIC BOTTLE Blood Culture adequate volume  Final   Culture  Setup Time   Final    GRAM POSITIVE COCCI IN CLUSTERS IN PEDIATRIC BOTTLE CRITICAL RESULT CALLED TO, READ BACK BY AND VERIFIED WITH: B MANCHERIL,PHARMD AT 1220 11/01/16 BY L BENFIELD    Culture (A)  Final    STAPHYLOCOCCUS SPECIES (COAGULASE NEGATIVE) THE SIGNIFICANCE OF ISOLATING THIS ORGANISM FROM A SINGLE SET OF BLOOD CULTURES WHEN MULTIPLE SETS ARE DRAWN IS UNCERTAIN. PLEASE NOTIFY THE MICROBIOLOGY DEPARTMENT WITHIN ONE WEEK IF SPECIATION AND SENSITIVITIES ARE REQUIRED.    Report Status  11/03/2016 FINAL  Final  Blood Culture ID Panel (Reflexed)     Status: Abnormal   Collection Time: 10/31/16  1:21 PM  Result Value Ref Range Status   Enterococcus species NOT DETECTED NOT DETECTED Final   Listeria monocytogenes NOT DETECTED NOT DETECTED Final   Staphylococcus species DETECTED (A) NOT DETECTED  Final    Comment: Methicillin (oxacillin) susceptible coagulase negative staphylococcus. Possible blood culture contaminant (unless isolated from more than one blood culture draw or clinical case suggests pathogenicity). No antibiotic treatment is indicated for blood  culture contaminants. CRITICAL RESULT CALLED TO, READ BACK BY AND VERIFIED WITH: B MANCHERIL,PHARMD AT 1220 11/01/16 BY L BENFIELD    Staphylococcus aureus NOT DETECTED NOT DETECTED Final   Methicillin resistance NOT DETECTED NOT DETECTED Final   Streptococcus species NOT DETECTED NOT DETECTED Final   Streptococcus agalactiae NOT DETECTED NOT DETECTED Final   Streptococcus pneumoniae NOT DETECTED NOT DETECTED Final   Streptococcus pyogenes NOT DETECTED NOT DETECTED Final   Acinetobacter baumannii NOT DETECTED NOT DETECTED Final   Enterobacteriaceae species NOT DETECTED NOT DETECTED Final   Enterobacter cloacae complex NOT DETECTED NOT DETECTED Final   Escherichia coli NOT DETECTED NOT DETECTED Final   Klebsiella oxytoca NOT DETECTED NOT DETECTED Final   Klebsiella pneumoniae NOT DETECTED NOT DETECTED Final   Proteus species NOT DETECTED NOT DETECTED Final   Serratia marcescens NOT DETECTED NOT DETECTED Final   Haemophilus influenzae NOT DETECTED NOT DETECTED Final   Neisseria meningitidis NOT DETECTED NOT DETECTED Final   Pseudomonas aeruginosa NOT DETECTED NOT DETECTED Final   Candida albicans NOT DETECTED NOT DETECTED Final   Candida glabrata NOT DETECTED NOT DETECTED Final   Candida krusei NOT DETECTED NOT DETECTED Final   Candida parapsilosis NOT DETECTED NOT DETECTED Final   Candida tropicalis NOT DETECTED NOT DETECTED Final  Respiratory Panel by PCR     Status: None   Collection Time: 10/31/16  2:15 PM  Result Value Ref Range Status   Adenovirus NOT DETECTED NOT DETECTED Final   Coronavirus 229E NOT DETECTED NOT DETECTED Final   Coronavirus HKU1 NOT DETECTED NOT DETECTED Final   Coronavirus NL63 NOT DETECTED NOT  DETECTED Final   Coronavirus OC43 NOT DETECTED NOT DETECTED Final   Metapneumovirus NOT DETECTED NOT DETECTED Final   Rhinovirus / Enterovirus NOT DETECTED NOT DETECTED Final   Influenza A NOT DETECTED NOT DETECTED Final   Influenza B NOT DETECTED NOT DETECTED Final   Parainfluenza Virus 1 NOT DETECTED NOT DETECTED Final   Parainfluenza Virus 2 NOT DETECTED NOT DETECTED Final   Parainfluenza Virus 3 NOT DETECTED NOT DETECTED Final   Parainfluenza Virus 4 NOT DETECTED NOT DETECTED Final   Respiratory Syncytial Virus NOT DETECTED NOT DETECTED Final   Bordetella pertussis NOT DETECTED NOT DETECTED Final   Chlamydophila pneumoniae NOT DETECTED NOT DETECTED Final   Mycoplasma pneumoniae NOT DETECTED NOT DETECTED Final     Labs: BNP (last 3 results) No results for input(s): BNP in the last 8760 hours. Basic Metabolic Panel:  Recent Labs Lab 11/01/16 1850  11/03/16 0530 11/04/16 0405 11/05/16 0430 11/06/16 0459 11/07/16 8119  NA  --   < > 144 145 146* 143 139  K  --   < > 3.5 4.3 3.8 3.6 3.3*  CL  --   < > 98* 99* 102 103 101  CO2  --   < > 37* 38* 35* 32 27  GLUCOSE  --   < > 111* 140* 127* 116*  96  BUN  --   < > 20 26* 34* 26* 18  CREATININE  --   < > 0.66 0.62 0.80 0.58 0.57  CALCIUM  --   < > 8.9 9.1 9.2 9.3 9.2  MG 2.6*  --   --   --  2.4  --   --   PHOS 4.3  --   --   --  3.7  --   --   < > = values in this interval not displayed. Liver Function Tests:  Recent Labs Lab 11/04/16 0405 11/06/16 0459  AST 24 38  ALT 27 26  ALKPHOS 70 60  BILITOT 0.5 0.7  PROT 6.5 6.9  ALBUMIN 2.9* 3.0*   No results for input(s): LIPASE, AMYLASE in the last 168 hours. No results for input(s): AMMONIA in the last 168 hours. CBC:  Recent Labs Lab 11/02/16 0430 11/03/16 0530 11/04/16 0405 11/05/16 0430 11/06/16 0459 11/07/16 0638  WBC 12.8* 8.9 9.5 10.1 9.7 10.4  NEUTROABS 8.8* 5.2  --   --   --   --   HGB 11.4* 10.7* 11.4* 11.4* 11.6* 12.5  HCT 34.9* 34.6* 36.3 36.7 36.3  37.6  MCV 87.9 92.5 91.9 91.5 90.3 86.8  PLT 199 193 201 226 228 233   Cardiac Enzymes: No results for input(s): CKTOTAL, CKMB, CKMBINDEX, TROPONINI in the last 168 hours. BNP: Invalid input(s): POCBNP CBG:  Recent Labs Lab 11/07/16 1239 11/07/16 1655 11/07/16 1848 11/07/16 2218 11/08/16 0808  GLUCAP 136* 154* 188* 134* 116*   D-Dimer No results for input(s): DDIMER in the last 72 hours. Hgb A1c No results for input(s): HGBA1C in the last 72 hours. Lipid Profile No results for input(s): CHOL, HDL, LDLCALC, TRIG, CHOLHDL, LDLDIRECT in the last 72 hours. Thyroid function studies No results for input(s): TSH, T4TOTAL, T3FREE, THYROIDAB in the last 72 hours.  Invalid input(s): FREET3 Anemia work up No results for input(s): VITAMINB12, FOLATE, FERRITIN, TIBC, IRON, RETICCTPCT in the last 72 hours. Urinalysis    Component Value Date/Time   COLORURINE YELLOW 10/31/2016 1357   APPEARANCEUR CLEAR 10/31/2016 1357   LABSPEC 1.014 10/31/2016 1357   PHURINE 5.0 10/31/2016 1357   GLUCOSEU NEGATIVE 10/31/2016 1357   HGBUR NEGATIVE 10/31/2016 1357   BILIRUBINUR NEGATIVE 10/31/2016 1357   KETONESUR 5 (A) 10/31/2016 1357   PROTEINUR 30 (A) 10/31/2016 1357   UROBILINOGEN 0.2 10/22/2012 2052   NITRITE NEGATIVE 10/31/2016 1357   LEUKOCYTESUR NEGATIVE 10/31/2016 1357   Sepsis Labs Invalid input(s): PROCALCITONIN,  WBC,  LACTICIDVEN Microbiology Recent Results (from the past 240 hour(s))  MRSA PCR Screening     Status: None   Collection Time: 10/31/16 12:29 PM  Result Value Ref Range Status   MRSA by PCR NEGATIVE NEGATIVE Final    Comment:        The GeneXpert MRSA Assay (FDA approved for NASAL specimens only), is one component of a comprehensive MRSA colonization surveillance program. It is not intended to diagnose MRSA infection nor to guide or monitor treatment for MRSA infections.   Culture, respiratory (NON-Expectorated)     Status: None   Collection Time: 10/31/16   1:06 PM  Result Value Ref Range Status   Specimen Description TRACHEAL ASPIRATE  Final   Special Requests NONE  Final   Gram Stain   Final    NO SQUAMOUS EPITHELIAL CELLS SEEN RARE WBC PRESENT,BOTH PMN AND MONONUCLEAR NO ORGANISMS SEEN    Culture NO GROWTH 2 DAYS  Final   Report  Status 11/02/2016 FINAL  Final  Culture, blood (routine x 2)     Status: None   Collection Time: 10/31/16  1:21 PM  Result Value Ref Range Status   Specimen Description BLOOD RIGHT HAND  Final   Special Requests IN PEDIATRIC BOTTLE Blood Culture adequate volume  Final   Culture NO GROWTH 5 DAYS  Final   Report Status 11/05/2016 FINAL  Final  Culture, blood (routine x 2)     Status: Abnormal   Collection Time: 10/31/16  1:21 PM  Result Value Ref Range Status   Specimen Description BLOOD LEFT HAND  Final   Special Requests IN PEDIATRIC BOTTLE Blood Culture adequate volume  Final   Culture  Setup Time   Final    GRAM POSITIVE COCCI IN CLUSTERS IN PEDIATRIC BOTTLE CRITICAL RESULT CALLED TO, READ BACK BY AND VERIFIED WITH: B MANCHERIL,PHARMD AT 1220 11/01/16 BY L BENFIELD    Culture (A)  Final    STAPHYLOCOCCUS SPECIES (COAGULASE NEGATIVE) THE SIGNIFICANCE OF ISOLATING THIS ORGANISM FROM A SINGLE SET OF BLOOD CULTURES WHEN MULTIPLE SETS ARE DRAWN IS UNCERTAIN. PLEASE NOTIFY THE MICROBIOLOGY DEPARTMENT WITHIN ONE WEEK IF SPECIATION AND SENSITIVITIES ARE REQUIRED.    Report Status 11/03/2016 FINAL  Final  Blood Culture ID Panel (Reflexed)     Status: Abnormal   Collection Time: 10/31/16  1:21 PM  Result Value Ref Range Status   Enterococcus species NOT DETECTED NOT DETECTED Final   Listeria monocytogenes NOT DETECTED NOT DETECTED Final   Staphylococcus species DETECTED (A) NOT DETECTED Final    Comment: Methicillin (oxacillin) susceptible coagulase negative staphylococcus. Possible blood culture contaminant (unless isolated from more than one blood culture draw or clinical case suggests pathogenicity). No  antibiotic treatment is indicated for blood  culture contaminants. CRITICAL RESULT CALLED TO, READ BACK BY AND VERIFIED WITH: B MANCHERIL,PHARMD AT 1220 11/01/16 BY L BENFIELD    Staphylococcus aureus NOT DETECTED NOT DETECTED Final   Methicillin resistance NOT DETECTED NOT DETECTED Final   Streptococcus species NOT DETECTED NOT DETECTED Final   Streptococcus agalactiae NOT DETECTED NOT DETECTED Final   Streptococcus pneumoniae NOT DETECTED NOT DETECTED Final   Streptococcus pyogenes NOT DETECTED NOT DETECTED Final   Acinetobacter baumannii NOT DETECTED NOT DETECTED Final   Enterobacteriaceae species NOT DETECTED NOT DETECTED Final   Enterobacter cloacae complex NOT DETECTED NOT DETECTED Final   Escherichia coli NOT DETECTED NOT DETECTED Final   Klebsiella oxytoca NOT DETECTED NOT DETECTED Final   Klebsiella pneumoniae NOT DETECTED NOT DETECTED Final   Proteus species NOT DETECTED NOT DETECTED Final   Serratia marcescens NOT DETECTED NOT DETECTED Final   Haemophilus influenzae NOT DETECTED NOT DETECTED Final   Neisseria meningitidis NOT DETECTED NOT DETECTED Final   Pseudomonas aeruginosa NOT DETECTED NOT DETECTED Final   Candida albicans NOT DETECTED NOT DETECTED Final   Candida glabrata NOT DETECTED NOT DETECTED Final   Candida krusei NOT DETECTED NOT DETECTED Final   Candida parapsilosis NOT DETECTED NOT DETECTED Final   Candida tropicalis NOT DETECTED NOT DETECTED Final  Respiratory Panel by PCR     Status: None   Collection Time: 10/31/16  2:15 PM  Result Value Ref Range Status   Adenovirus NOT DETECTED NOT DETECTED Final   Coronavirus 229E NOT DETECTED NOT DETECTED Final   Coronavirus HKU1 NOT DETECTED NOT DETECTED Final   Coronavirus NL63 NOT DETECTED NOT DETECTED Final   Coronavirus OC43 NOT DETECTED NOT DETECTED Final   Metapneumovirus NOT DETECTED NOT DETECTED Final  Rhinovirus / Enterovirus NOT DETECTED NOT DETECTED Final   Influenza A NOT DETECTED NOT DETECTED Final    Influenza B NOT DETECTED NOT DETECTED Final   Parainfluenza Virus 1 NOT DETECTED NOT DETECTED Final   Parainfluenza Virus 2 NOT DETECTED NOT DETECTED Final   Parainfluenza Virus 3 NOT DETECTED NOT DETECTED Final   Parainfluenza Virus 4 NOT DETECTED NOT DETECTED Final   Respiratory Syncytial Virus NOT DETECTED NOT DETECTED Final   Bordetella pertussis NOT DETECTED NOT DETECTED Final   Chlamydophila pneumoniae NOT DETECTED NOT DETECTED Final   Mycoplasma pneumoniae NOT DETECTED NOT DETECTED Final    Time coordinating discharge:  35 minutes  SIGNED:  Latrelle Dodrill, MD  [facility] 11/08/2016, 12:15 PM  Pager please text page via  [email or web address] Password [email or web address]

## 2016-11-08 NOTE — Progress Notes (Signed)
Came into the patient room to administer med for the night.  Patient was hard to arouse.  Patient would respond to touch but would drift back off to sleep.  Vital signs collected.  Temp: 98.3 BP: 135/78 .  Pulse: 61 Resp: 16 and 100% on 4LPM.  CBG normal. Pt unable to enough to follow commands when instructed to sip from the cup with the straw.  Notified the charge nurse on the floor of the change in LOC.  Charged assessed the patient and instructed to notify Rapid response of patient condition .  Rapid response notified.  Will continue to monitor the patient

## 2016-11-08 NOTE — Progress Notes (Signed)
Latasha [name] to be D/C'd Home per MD order.  Discussed with the patient and all questions fully answered.  VSS, Skin clean, dry and intact without evidence of skin break down, no evidence of skin tears noted. IV catheter discontinued intact. Site without signs and symptoms of complications. Dressing and pressure applied.  An After Visit Summary was printed and given to the patient. Patient received prescription.  D/c education completed with patient/family including follow up instructions, medication list, d/c activities limitations if indicated, with other d/c instructions as indicated by MD - patient able to verbalize understanding, all questions fully answered.   Patient instructed to return to ED, call 911, or call MD for any changes in condition.   Patient escorted via WC, and D/C home via private auto.  Melvenia NeedlesIreti O Nguyen Butler 11/08/2016 2:16 PM

## 2016-11-12 ENCOUNTER — Other Ambulatory Visit: Payer: Self-pay | Admitting: *Deleted

## 2016-11-12 NOTE — Patient Outreach (Signed)
[facility] Care Management  11/12/2016  Latasha [name] 08/24/1975 [id number]  EMMI-Pneumonia referral for Red Alert for Day #1, 11/11/2016:  Reason: Scheduled a follow up appointment:  Call #1 to patient who advised of reason for call.  Voices that she was scheduled for new patient appointment but missed due to was in hospital. States she called office to make follow up appointment & advised that she missed initial appointment due to in hospital.  States she was advised that office would not be able to provide primary services for her.  States currently she does not have primary care provider.  Patient was given phone number to call for finding a primary care provider.  Patient states she will call & follow process to establish with Primary Care Provider.   Patient was advised that she also needed to call to make appointment for hospital follow up with pulmonologist-(Dr. Craige CottaSood). Contact phone number was given to patient & she voices she will call to make appointment for hospital follow up.  Patient takes she has all of medications & is taking as instructed.  States she completed antibiotic medication while hospitalized. States she is using home oxygen therapy at 2 liters. Voices she also uses pulsox & knows if level is too low. Patient voices symptoms that she needs to report to medical personnel as it relates to pneumonia.   EMMI call completed.  Plan: Send EMMI-Pneumonia information as means of review & re-enforcement of things she was able to voice.  Close case.  Colleen CanLinda Ski Polich, RN BSN CCM Care Management Coordinator [facility]  [phone]

## 2016-11-14 ENCOUNTER — Other Ambulatory Visit: Payer: Self-pay | Admitting: *Deleted

## 2016-11-14 NOTE — Patient Outreach (Signed)
[facility]) Care Management  11/14/2016  LAURAANN MISSEY [birth year] [id number]  EMMI-Pneumonia -Red Alert Day#2 & Day #3, 9/12, 9/13: Reasons: Wheezing & diarrhea; no follow up appointment.   Telephone call #1 to patient. Voices that she currently is breathing without problems or wheezing. States she is wearing oxygen therapy. States she does have headache and has taken naprosyn as ordered for that problem.   States has not made primary care appointment but is in the process of getting a new primary care provider. States she has recommendation from family member & plans to follow up with MD office to check if in network with insurance. States she is awaiting call from pulmonary office for appointment for hospital follow up.  EMMI call completed .   Plan: Send to care management assistant to close case.  Colleen Can, RN BSN CCM Care Management Coordinator [facility] Care Management  [phone] .

## 2016-11-17 ENCOUNTER — Other Ambulatory Visit: Payer: Self-pay | Admitting: *Deleted

## 2016-11-17 ENCOUNTER — Encounter: Payer: Self-pay | Admitting: *Deleted

## 2016-11-17 NOTE — Patient Outreach (Signed)
[facility]) Care Management  11/17/2016  Latasha [name] 1975-05-08 [id number]  Referral from EMMI-Pneumonia-Red Alert for Day#4 ,#5,#6, 9/14,-9/16:  Reasons no scheduled follow up, wheezing, swelling in hands,exposed to smoke:  Telephone call #1 to patient.  [place] verification received from patient_ Patient states she has a scheduled appointment with new primary care provider 9/25 with [facility]. States she has not received call back from pulmonary office but will call back in several days to get appointment for follow up. States she uses Medical transport to get to MD appointments.   States she stopped smoking while hospitalized and has not smoked since discharge. States other family member smokes but they go outside. States she is using oxygen as ordered. Voices that she has support from family members.  EMMI addressed    Plan: Close case/send to care management assistant.  Colleen Can, RN BSN CCM Care Management Coordinator [facility] Care Management  [phone] :

## 2016-11-19 ENCOUNTER — Other Ambulatory Visit: Payer: Self-pay | Admitting: *Deleted

## 2016-11-19 NOTE — Patient Outreach (Signed)
[facility]) Care Management  11/19/2016  Latasha [name] May 19, 1975 [id number]  EMMI-pneumonia-red alert day#8, 11/18/2016: not feeling better over all.  This has already been addressed after speaking with patient 11/17/2016.  Patient has made appointment with new primary care provider & has appointment set. States she will report all of symptoms that she is experiencing. Voices that she knows to call 911 with severe symptoms. Voiced that she continues to use oxygen as instructed. .   Voiced that she will also see pulmonologist in follow up.  Will close out case. Send to care management assistant.  Colleen Can, RN BSN CCM Care Management Coordinator [facility] Care Management  [phone]

## 2016-11-20 DIAGNOSIS — K739 Chronic hepatitis, unspecified: Secondary | ICD-10-CM | POA: Diagnosis not present

## 2016-11-24 ENCOUNTER — Ambulatory Visit (INDEPENDENT_AMBULATORY_CARE_PROVIDER_SITE_OTHER)
Admission: RE | Admit: 2016-11-24 | Discharge: 2016-11-24 | Disposition: A | Discharge status: Home / Self Care | Payer: Medicare Other | Source: Ambulatory Visit | Attending: Acute Care | Admitting: Acute Care

## 2016-11-24 ENCOUNTER — Ambulatory Visit (INDEPENDENT_AMBULATORY_CARE_PROVIDER_SITE_OTHER): Payer: Medicare Other | Admitting: Acute Care

## 2016-11-24 ENCOUNTER — Encounter: Payer: Self-pay | Admitting: Acute Care

## 2016-11-24 VITALS — BP 110/70 | HR 127 | Ht 67.0 in | Wt 222.4 lb

## 2016-11-24 DIAGNOSIS — J9601 Acute respiratory failure with hypoxia: Secondary | ICD-10-CM

## 2016-11-24 DIAGNOSIS — R05 Cough: Secondary | ICD-10-CM | POA: Diagnosis not present

## 2016-11-24 DIAGNOSIS — J189 Pneumonia, unspecified organism: Secondary | ICD-10-CM

## 2016-11-24 DIAGNOSIS — A419 Sepsis, unspecified organism: Secondary | ICD-10-CM | POA: Diagnosis not present

## 2016-11-24 DIAGNOSIS — Z720 Tobacco use: Secondary | ICD-10-CM | POA: Diagnosis not present

## 2016-11-24 DIAGNOSIS — J441 Chronic obstructive pulmonary disease with (acute) exacerbation: Secondary | ICD-10-CM | POA: Diagnosis not present

## 2016-11-24 MED ORDER — DOXYCYCLINE HYCLATE 100 MG PO TABS: 100.0000 mg | ORAL_TABLET | Freq: Two times a day (BID) | ORAL | 0 refills | Status: DC

## 2016-11-24 MED ORDER — LEVALBUTEROL HCL 0.63 MG/3ML IN NEBU: 0.6300 mg | INHALATION_SOLUTION | Freq: Once | RESPIRATORY_TRACT | Status: DC

## 2016-11-24 MED ORDER — PREDNISONE 10 MG (21) PO TBPK: ORAL_TABLET | ORAL | 0 refills | Status: DC

## 2016-11-24 MED ORDER — FLUTICASONE FUROATE-VILANTEROL 200-25 MCG/INH IN AEPB: 1.0000 | INHALATION_SPRAY | Freq: Every day | RESPIRATORY_TRACT | 0 refills | Status: DC

## 2016-11-24 NOTE — Assessment & Plan Note (Addendum)
Discharge on oxygen 2 L nasal cannula 24/ 7 Presents for hospital follow-up with a COPD flare Plan: Ambulatory saturation the office today You dropped your oxygen saturations to 89% with walking. You do not need your oxygen at rest. Continue to wear it with exertion. Saturation goals are 88-92% Reevaluation of follow-up after treatment of COPD flare

## 2016-11-24 NOTE — Assessment & Plan Note (Signed)
Flare post hospitalization Plan:   COPD Please call [facility] ( [phone]) for your Pulmonary Function Tests. ( Ask them to fax to [phone]) Please follow up with Dr. Juanetta Gosling who you have seen before for your lungs. He may need you to get a repeat Pulmonary Function Test per Dr. Juanetta Gosling We will give you a sample of Breo 1 puff once daily Rinse mouth after use Continue using Albuterol as needed for shortness of breath or wheezing up to every 6 hours. Start Mucinex 1200 mg twice daily with a full glass of water. Doxycycline 100 mg twice daily x 7 days Prednisone taper; 10 mg tablets: 4 tabs x 2 days, 3 tabs x 2 days, 2 tabs x 2 days 1 tab x 2 days then stop. Note your daily symptoms > remember "red flags" for COPD:  Increase in cough, increase in sputum production, increase in shortness of breath or activity tolerance. If you notice these symptoms, please call to be seen.   Follow up in 2 weeks to assure you are better

## 2016-11-24 NOTE — Progress Notes (Signed)
History of Present Illness Latasha [name] is a 41 y.o. female current smoker ( States she quit 10/30/2016)  with multiple medical problems including history of asthma, COPD, BiPolar Disorder, ADHD, seizures , chronic joint pain and stroke. She was seen as an inpatient  by [facility] for CAP requiring intubation 10/2016.   11/24/2016 Hospital Follow Up:  Pt. Presents for Hospital Follow up. She was admitted 10/31/2016-11/08/2016. She was diagnosed with CAP, acute on chronic respiratory failure which required intubation, and sepsis 2/2 UTI/pneumonia. Pt was intubated from 10/30/2016- 11/05/2016. She was treated with Rocephin and Azithro, IV steroids, Scheduled nebulizer treatments and oxygen. She was deemed stable for discharge 11/08/2016.She has not followed up with a PCP as of yet but has made an appointment. She states she has COPD/Emphysema based on PFT's Dr. Juanetta Gosling did in the past.  She does not know the date or year.She has not see Dr. Juanetta Gosling in the last 2 years.She is not on any maintenance inhalers, but states she wants to be started on maintenance inhalers to improve her breathing.   Pt. Presents for follow up. She states she is much better since discharge. She states she has quit smoking completely. She states she wants to get off her oxygen.  Her biggest complaint is that she is short winded because she does not have any inhalers for her COPD.She does not see a pulmonologist on a regular basis.She has a cough with thick yellow brown secretions that are hard to cough up. She states she is wheezing, and short of breath..She denies fever, chest pain, orthopnea or hemoptysis.She is unkempt, but states that she has a hard time with daily care or leaving her home due to her anxiety. She states she has not smoked since she was admitted to the hospital 10/31/2016. I congratulated her.   Test Results: CULTURES: Sputum 8/31>>>>neg BCX2 8/31>>> coag neg SA U strep 8/31>>>neg  U legionella 8/31>>>neg  RVP  8/31>>> negative  ANTIBIOTICS: Rocephin 8/28>>8/31 Azith 8/28>>>9/3  SIGNIFICANT EVENTS: 8/30 > intubated 9/5 > extubated 9/6 > transferred out of ICU  LINES/TUBES: OETT 8/30>>> 9/6 R IJ CVL 8/31 > 9/6   CBC Latest Ref Rng & Units 11/07/2016 11/06/2016 11/05/2016  WBC 4.0 - 10.5 K/uL 10.4 9.7 10.1  Hemoglobin 12.0 - 15.0 g/dL 16.1 11.6(L) 11.4(L)  Hematocrit 36.0 - 46.0 % 37.6 36.3 36.7  Platelets 150 - 400 K/uL 233 228 226    BMP Latest Ref Rng & Units 11/07/2016 11/06/2016 11/05/2016  Glucose 65 - 99 mg/dL 96 096(E) 454(U)  BUN 6 - 20 mg/dL 18 98(J) 19(J)  Creatinine 0.44 - 1.00 mg/dL 4.78 2.95 6.21  Sodium 135 - 145 mmol/L 139 143 146(H)  Potassium 3.5 - 5.1 mmol/L 3.3(L) 3.6 3.8  Chloride 101 - 111 mmol/L 101 103 102  CO2 22 - 32 mmol/L 27 32 35(H)  Calcium 8.9 - 10.3 mg/dL 9.2 9.3 9.2     Dg Chest 2 View  Result Date: 11/24/2016 CLINICAL DATA:  Personal history of pneumonia earlier this month, presenting with persistent cough, chest congestion and shortness of breath. Current history of COPD EXAM: CHEST  2 VIEW COMPARISON:  11/06/2016, 11/04/2016. FINDINGS: Cardiomediastinal silhouette unremarkable, unchanged. Linear scarring in the lingula. Interval resolution of the consolidation in the lower lobes. Prominent bronchovascular markings diffusely and moderate central peribronchial thickening, more so than on a prior two-view chest x-ray 09/01/2014. Lungs otherwise clear. No pleural effusions. Mild degenerative changes involving the thoracic spine. IMPRESSION: 1. Interval resolution of bilateral  lower lobe pneumonia since 3 weeks ago. Linear scarring in the lingula. 2. Moderate changes of acute bronchitis and/or asthma superimposed upon COPD. Electronically Signed   By: Hulan Saas M.D.   On: 11/24/2016 15:07   Dg Chest Port 1 View  Result Date: 11/06/2016 CLINICAL DATA:  Acute respiratory failure, pneumonia, current smoker. EXAM: PORTABLE CHEST 1 VIEW COMPARISON:  Portable  chest x-ray of November 04, 2016 FINDINGS: The trachea and esophagus have been extubated. There has been interval improvement in the appearance of the right lung base. On the left increased density persists and the hemidiaphragm remains obscured. The cardiac silhouette is mildly enlarged. The pulmonary vascularity is mildly engorged. The mediastinum is normal in width. The right internal jugular venous catheter tip projects over the midportion of the SVC. IMPRESSION: Persistent left lower lobe atelectasis or pneumonia. Improving right basilar atelectasis or infiltrate. Mild pulmonary vascular engorgement is observed but has decreased. Electronically Signed   By: David  [place] M.D.   On: 11/06/2016 07:19   Dg Chest Port 1 View  Result Date: 11/04/2016 CLINICAL DATA:  Hypoxia EXAM: PORTABLE CHEST 1 VIEW COMPARISON:  Study obtained earlier in the day FINDINGS: Endotracheal tube tip is 4.3 cm above the carina. Central catheter tip is in the superior vena cava. Nasogastric tube tip and side port below the diaphragm. No pneumothorax. There are small pleural effusions bilaterally with patchy bibasilar atelectasis. There is airspace consolidation in the left lower lobe. Heart is borderline enlarged with pulmonary vascularity within normal limits. No adenopathy. No bone lesions. IMPRESSION: Tube and catheter positions as described without evident pneumothorax. Bibasilar atelectasis with small pleural effusions bilaterally. Consolidation left lower lobe, concerning for pneumonia. Stable cardiac silhouette. Electronically Signed   By: Bretta Bang III M.D.   On: 11/04/2016 21:51   Dg Chest Port 1 View  Result Date: 11/04/2016 CLINICAL DATA:  41 year old female with pneumonia. Subsequent encounter. EXAM: PORTABLE CHEST 1 VIEW COMPARISON:  11/03/2016. FINDINGS: Endotracheal tube tip 5.8 cm above the carina. Right central line tip proximal superior vena cava level. Nasogastric tube courses below the diaphragm. Tip is  not included on the present exam. Poor inspiration with bibasilar parenchymal changes suspicious for atelectasis although pneumonia not excluded. Appearance unchanged. Pulmonary vascular prominence without pulmonary edema. No pneumothorax. Mediastinal and cardiac silhouette unchanged. IMPRESSION: Poor inspiratory exam with bibasilar parenchymal changes similar to prior examination suggestive of atelectasis although pneumonia not excluded. Electronically Signed   By: Lacy Duverney M.D.   On: 11/04/2016 07:10   Dg Chest Port 1 View  Result Date: 11/03/2016 CLINICAL DATA:  Acute respiratory failure. EXAM: PORTABLE CHEST 1 VIEW COMPARISON:  One-view chest x-ray 11/02/2016 FINDINGS: Endotracheal tube and the right IJ line are stable. NG tube courses off the inferior border the film. Heart size is exaggerated by low lung volumes. Interstitial edema and bilateral effusions are stable. Bibasilar airspace disease likely reflects atelectasis. IMPRESSION: 1. No significant interval change and low lung volumes, mild edema and bilateral effusions. 2. Bibasilar airspace disease likely reflects atelectasis. 3. Support apparatus is stable. Electronically Signed   By: Marin Roberts M.D.   On: 11/03/2016 07:32   Dg Chest Port 1 View  Result Date: 11/02/2016 CLINICAL DATA:  Acute respiratory failure. EXAM: PORTABLE CHEST 1 VIEW COMPARISON:  One-view chest x-ray 11/01/2016 FINDINGS: The patient is slightly rotated to the left. Endotracheal tube and right IJ line are stable. The NG tube courses off the inferior border of the film. Heart size is exaggerated by low  lung volumes. Mild pulmonary vascular congestion is again noted. Bilateral pleural effusions and basilar airspace disease is unchanged. IMPRESSION: 1. Slight increase in pulmonary vascular congestion. 2. Stable bilateral pleural effusions and lower lobe airspace disease. While this likely reflects atelectasis, infection is not excluded. 3. The support apparatus is  stable. Electronically Signed   By: Marin Roberts M.D.   On: 11/02/2016 07:12   Dg Chest Port 1 View  Result Date: 11/01/2016 CLINICAL DATA:  Ventilator dependent. EXAM: PORTABLE CHEST 1 VIEW COMPARISON:  One-view chest x-ray 10/31/2016 FINDINGS: Endotracheal tube, right IJ line, and NG tube are stable. The heart size is normal. Aeration is slightly improved. Bibasilar airspace disease and effusions are again noted. Mild pulmonary vascular congestion is improved. IMPRESSION: 1. Improving aeration in decreased pulmonary vascular congestion. 2. Bilateral effusions and lower lobe airspace disease is also improved. This likely reflects atelectasis. Airspace disease is not excluded. 3. The support apparatus is stable. Electronically Signed   By: Marin Roberts M.D.   On: 11/01/2016 08:06   Dg Chest Port 1 View  Result Date: 10/31/2016 CLINICAL DATA:  Central line placement. EXAM: PORTABLE CHEST 1 VIEW COMPARISON:  Earlier today. FINDINGS: Interval right jugular catheter with its tip in the superior vena cava. No pneumothorax. Endotracheal tube in satisfactory position. Nasogastric tube tip and side hole in the proximal stomach. The cardiac silhouette remains borderline enlarged. No significant change in diffuse prominence of the pulmonary vasculature and interstitial markings and bibasilar linear and patchy opacities. IMPRESSION: 1. Right jugular catheter tip in the superior vena cava without pneumothorax. 2. No significant change in borderline cardiomegaly and changes of congestive heart failure with bibasilar atelectasis and possible alveolar edema or pneumonia. Electronically Signed   By: Beckie Salts M.D.   On: 10/31/2016 14:54   Dg Chest Port 1 View  Result Date: 10/31/2016 CLINICAL DATA:  Confirm ET and orogastric tube placement. EXAM: PORTABLE CHEST 1 VIEW COMPARISON:  Chest x-ray from same day. FINDINGS: The endotracheal and enteric tubes are unchanged in position. Cardiomegaly with  increased pulmonary vascular congestion, interstitial edema, and bibasilar opacities. Small bilateral pleural effusions. No pneumothorax. IMPRESSION: 1. Stable lines and tubes. 2. No change in lower lobe predominant bilateral pulmonary opacities and edema. Electronically Signed   By: Obie Dredge M.D.   On: 10/31/2016 13:26     Past medical hx Past Medical History:  Diagnosis Date  . ADHD (attention deficit hyperactivity disorder)   . Anxiety and depression   . Arthritis   . Asthma   . Bipolar disorder (HCC)   . Chronic back pain   . Chronic hip pain   . Chronic neck pain   . COPD (chronic obstructive pulmonary disease) (HCC)   . DDD (degenerative disc disease)   . Fibromyalgia   . Headache(784.0)   . High cholesterol   . Pneumonia   . PTSD (post-traumatic stress disorder)   . Sciatica   . Seizure (HCC)    scar tissue " on Brain". last seizure 11/2012  . Shortness of breath   . Stroke [facility])      Social History  Substance Use Topics  . Smoking status: Former Smoker    Packs/day: 0.50    Years: 20.00    Types: E-cigarettes    Start date: 03/04/1983    Quit date: 01/06/2013  . Smokeless tobacco: Never Used     Comment: Quit cigarettes 01/06/13; currently smokes E cigs  . Alcohol use No     Comment: used to  drink "years ago".     Ms.Larsh reports that she quit smoking about 3 years ago. Her smoking use included E-cigarettes. She started smoking about 33 years ago. She has a 10.00 pack-year smoking history. She has never used smokeless tobacco. She reports that she uses drugs, including Marijuana, about 2 times per week. She reports that she does not drink alcohol.  Tobacco Cessation: States she quit smoking 10/31/2016  Past surgical hx, Family hx, Social hx all reviewed.  Current Outpatient Prescriptions on File Prior to Visit  Medication Sig  . diazepam (VALIUM) 10 MG tablet Take 0.5 tablets (5 mg total) by mouth every 8 (eight) hours as needed for anxiety.  Marland Kitchen  FLUoxetine (PROZAC) 40 MG capsule Take 40 mg by mouth daily.   Marland Kitchen gabapentin (NEURONTIN) 800 MG tablet Take 800 mg by mouth 4 (four) times daily.   . SUBOXONE 8-2 MG FILM Take 1 Film by mouth 3 (three) times daily.  . ziprasidone (GEODON) 80 MG capsule Take 80 mg by mouth 2 (two) times daily with a meal.   . albuterol (PROAIR HFA) 108 (90 BASE) MCG/ACT inhaler Inhale 2 puffs into the lungs every 6 (six) hours as needed. Shortness of breath.   No current facility-administered medications on file prior to visit.      Allergies  Allergen Reactions  . Aripiprazole Other (See Comments)    Jerks, Locks up jaws.   . Cephalexin Nausea And Vomiting  . Haldol [Haloperidol Decanoate] Swelling and Other (See Comments)    Locks up jaws  . Tegretol [Carbamazepine] Other (See Comments)    Blisters, Sores  . Zofran Other (See Comments)    Locks up jaw, restlessness   . Ketorolac Tromethamine Rash    Review Of Systems:  Constitutional:   No  weight loss, night sweats,  Fevers, chills, fatigue, or  lassitude.  HEENT:   No headaches,  Difficulty swallowing,  Tooth/dental problems, or  Sore throat,                No sneezing, itching, ear ache, nasal congestion, post nasal drip,   CV:  No chest pain,  Orthopnea, PND, swelling in lower extremities, anasarca, dizziness, palpitations, syncope.   GI  No heartburn, indigestion, abdominal pain, nausea, vomiting, diarrhea, change in bowel habits, loss of appetite, bloody stools.   Resp: +  shortness of breath with exertion or at rest.  + excess mucus, + productive cough,  + non-productive cough,  No coughing up of blood. + change in color of mucus.  + wheezing.  No chest wall deformity  Skin: no rash or lesions.  GU: no dysuria, change in color of urine, no urgency or frequency.  No flank pain, no hematuria   MS:  No joint pain or swelling.  No decreased range of motion.  No back pain.  Psych:  No change in mood or affect. No depression ,  +anxiety.   No memory loss.   Vital Signs BP 110/70 (BP Location: Right Arm, Cuff Size: Normal)   Pulse (!) 127   Ht  (1.702 m)   Wt 222 lb 6 oz (100.9 kg)   SpO2 93%   BMI 34.83 kg/m    Physical Exam:  General- No distress,  A&Ox3, unkempt anxious female. ENT: No sinus tenderness, TM clear, pale nasal mucosa, no oral exudate,no post nasal drip, no LAN Cardiac: S1, S2, regular rate and rhythm, no murmur Chest: + wheeze/ rales/ + Coarse rhonchi which clear with cough,; no  accessory muscle use, no nasal flaring, no sternal retractions Abd.: Soft Non-tender, obese Ext: No clubbing cyanosis, edema Neuro:  normal strength Skin: No rashes, warm and dry Psych: normal mood and behavior   Assessment/Plan  COPD with acute exacerbation (HCC) Flare post hospitalization Plan:   COPD Please call [facility] ( [phone]) for your Pulmonary Function Tests. ( Ask them to fax to [phone]) Please follow up with Dr. Juanetta Gosling who you have seen before for your lungs. He may need you to get a repeat Pulmonary Function Test per Dr. Juanetta Gosling We will give you a sample of Breo 1 puff once daily Rinse mouth after use Continue using Albuterol as needed for shortness of breath or wheezing up to every 6 hours. Start Mucinex 1200 mg twice daily with a full glass of water. Doxycycline 100 mg twice daily x 7 days Prednisone taper; 10 mg tablets: 4 tabs x 2 days, 3 tabs x 2 days, 2 tabs x 2 days 1 tab x 2 days then stop. Note your daily symptoms > remember "red flags" for COPD:  Increase in cough, increase in sputum production, increase in shortness of breath or activity tolerance. If you notice these symptoms, please call to be seen.   Follow up in 2 weeks to assure you are better     Acute respiratory failure with hypoxia (HCC) Discharge on oxygen 2 L nasal cannula 24/ 7 Presents for hospital follow-up with a COPD flare Plan: Ambulatory saturation the office today You dropped your oxygen  saturations to 89% with walking. You do not need your oxygen at rest. Continue to wear it with exertion. Saturation goals are 88-92% Reevaluation of follow-up after treatment of COPD flare    Tobacco abuse Per patient she quit smoking 10/31/2016 Has not had another cigarette since admission to the hospital Plan  Tobacco cessation Congratulations on quitting smoking. Quit date is 10/30/2016 Remain smoke free as this is the single most powerful action you can take to decrease her risk of lung cancer, pulmonary disease, heart disease, and stroke.    Sepsis (HCC) Resolved Blood cultures positive 10/31/2016 for coag-negative staph aureus Hemodynamically stable, afebrile Treatment with Rocephin and azithromycin completed 11/03/2016 Plan: Follow-up appointment with PCP as you have scheduled Follow-up with pulmonary in 2 weeks after treatment with doxycycline and prednisone taper for COPD flare      Bevelyn Ngo, NP 11/24/2016  7:54 PM

## 2016-11-24 NOTE — Patient Instructions (Addendum)
It is nice to meet you today.  Tobacco cessation Congratulations on quitting smoking. Quit date is 10/30/2016  CAP We will check a CXR today as follow up to make sure your pneumonia has cleared. We will call you with results  COPD Please call [facility] ( [phone]) for your Pulmonary Function Tests. ( Ask them to fax to [phone]) Please follow up with Dr. Juanetta Gosling who you have seen before for your lungs. He may need you to get a repeat Pulmonary Function Test per Dr. Juanetta Gosling We will give you a sample of Breo 1 puff once daily Rinse mouth after use Continue using Albuterol as needed for shortness of breath or wheezing up to every 6 hours. Start Mucinex 1200 mg twice daily with a full glass of water. Doxycycline 100 mg twice daily x 7 days Prednisone taper; 10 mg tablets: 4 tabs x 2 days, 3 tabs x 2 days, 2 tabs x 2 days 1 tab x 2 days then stop. Note your daily symptoms > remember "red flags" for COPD:  Increase in cough, increase in sputum production, increase in shortness of breath or activity tolerance. If you notice these symptoms, please call to be seen.   Follow up in 2 weeks to assure you are better  Hypoxic Respiratory Failure You dropped your oxygen saturations to 89% with walking. You do not need your oxygen at rest. Continue to wear it with exertion. Saturation goals are 88-92%

## 2016-11-24 NOTE — Assessment & Plan Note (Signed)
Resolved Blood cultures positive 10/31/2016 for coag-negative staph aureus Hemodynamically stable, afebrile Treatment with Rocephin and azithromycin completed 11/03/2016 Plan: Follow-up appointment with PCP as you have scheduled Follow-up with pulmonary in 2 weeks after treatment with doxycycline and prednisone taper for COPD flare

## 2016-11-24 NOTE — Assessment & Plan Note (Signed)
Per patient she quit smoking 10/31/2016 Has not had another cigarette since admission to the hospital Plan  Tobacco cessation Congratulations on quitting smoking. Quit date is 10/30/2016 Remain smoke free as this is the single most powerful action you can take to decrease her risk of lung cancer, pulmonary disease, heart disease, and stroke.

## 2016-11-25 ENCOUNTER — Ambulatory Visit (INDEPENDENT_AMBULATORY_CARE_PROVIDER_SITE_OTHER): Payer: Medicare Other | Admitting: Physician Assistant

## 2016-11-25 ENCOUNTER — Other Ambulatory Visit: Payer: Self-pay | Admitting: *Deleted

## 2016-11-25 ENCOUNTER — Encounter: Payer: Self-pay | Admitting: Physician Assistant

## 2016-11-25 VITALS — BP 136/83 | HR 90 | Temp 97.1°F | Ht 67.0 in | Wt 225.6 lb

## 2016-11-25 DIAGNOSIS — J189 Pneumonia, unspecified organism: Secondary | ICD-10-CM

## 2016-11-25 DIAGNOSIS — J441 Chronic obstructive pulmonary disease with (acute) exacerbation: Secondary | ICD-10-CM

## 2016-11-25 DIAGNOSIS — R0902 Hypoxemia: Secondary | ICD-10-CM

## 2016-11-25 DIAGNOSIS — J9601 Acute respiratory failure with hypoxia: Secondary | ICD-10-CM

## 2016-11-25 MED ORDER — ALBUTEROL SULFATE HFA 108 (90 BASE) MCG/ACT IN AERS: 2.0000 | INHALATION_SPRAY | Freq: Four times a day (QID) | RESPIRATORY_TRACT | 5 refills | Status: DC | PRN
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
## 2016-11-25 NOTE — Patient Outreach (Signed)
[facility]) Care Management  11/25/2016  Latasha [name] 05-Sep-1975 [id number]  EMMI-pneumonia referral for Red alert -9/20, 9/21;  Reason wheezing, not feeling better, activity level not back to normal.  Telephone call attempt; left HIPPA compliant voice mail requesting call back:  Plan: Will follow up.  Colleen Can, RN BSN CCM Care Management Coordinator [facility] Care Management  [phone]

## 2016-11-25 NOTE — Patient Instructions (Signed)
In a few days you may receive a survey in the mail or online from [employer] regarding your visit with us today. Please take a moment to fill this out. Your feedback is very important to our whole office. It can help us better understand your needs as well as improve your experience and satisfaction. Thank you for taking your time to complete it. We care about you.  Arjay Jaskiewicz, PA-C  

## 2016-11-25 NOTE — Progress Notes (Signed)
Reviewed, agree 

## 2016-11-28 ENCOUNTER — Other Ambulatory Visit: Payer: Self-pay | Admitting: *Deleted

## 2016-11-28 NOTE — Patient Outreach (Signed)
[facility]) Care Management  11/28/2016  Latasha [name] 12/10/1975 [id number]  EMMI-Pneumonia -red alert referral 9/20, 9/21 for wheezing, not feeling better:  Call # 2 to patient who gave HIPPA verification. Patient voices that she is feeling much better now & is not wheezing.  Voices that she has established with new primary care provider & attended 1st visit with [facility] on 9/25. States she was prescribed new rescue inhaler-Pro air. Voices that she has upcoming appointment scheduled for October. Also voices that she has had follow up appointment with [name] 9/24 where she was prescribed prednisone & antibiotic (doxycycline). States she will follow up with pulmonologist in [name] whom she has seen before-Dr. Juanetta Gosling.   Patient voices that she feels stronger now & is taking medications as prescribed. States she knows what symptoms to report to her MD. States she has not had any readmissions or emergency room visit since recent hospital stay.  Voicing no health needs at this time. EMMI referral has been addressed.  Plan: Close case/send to care management  assistant.  Colleen Can, RN BSN CCM Care Management Coordinator [facility] Care Management  [phone]

## 2016-12-08 DIAGNOSIS — J449 Chronic obstructive pulmonary disease, unspecified: Secondary | ICD-10-CM | POA: Diagnosis not present

## 2016-12-11 ENCOUNTER — Ambulatory Visit: Payer: Self-pay | Admitting: Acute Care

## 2016-12-16 ENCOUNTER — Telehealth: Payer: Self-pay | Admitting: Physician Assistant

## 2016-12-16 MED ORDER — GABAPENTIN 800 MG PO TABS: 800.0000 mg | ORAL_TABLET | Freq: Four times a day (QID) | ORAL | 0 refills | Status: DC

## 2016-12-16 NOTE — Telephone Encounter (Signed)
That is okay to refill one time

## 2016-12-16 NOTE — Telephone Encounter (Signed)
Patient aware rx sent to pharmacy.  

## 2016-12-17 ENCOUNTER — Ambulatory Visit: Payer: Self-pay | Admitting: Acute Care

## 2016-12-18 DIAGNOSIS — K739 Chronic hepatitis, unspecified: Secondary | ICD-10-CM | POA: Diagnosis not present

## 2016-12-18 MED FILL — PROMETHAZINE 25 MG TABLET: 25 | 30 days supply | Qty: 90 | Fill #0

## 2016-12-18 MED FILL — SUBOXONE 8 MG-2 MG SL FILM: 8-2 | 28 days supply | Qty: 84 | Fill #0

## 2016-12-31 DIAGNOSIS — J189 Pneumonia, unspecified organism: Secondary | ICD-10-CM | POA: Diagnosis not present

## 2016-12-31 DIAGNOSIS — J9611 Chronic respiratory failure with hypoxia: Secondary | ICD-10-CM | POA: Diagnosis not present

## 2016-12-31 DIAGNOSIS — J449 Chronic obstructive pulmonary disease, unspecified: Secondary | ICD-10-CM | POA: Diagnosis not present

## 2017-01-08 DIAGNOSIS — J449 Chronic obstructive pulmonary disease, unspecified: Secondary | ICD-10-CM | POA: Diagnosis not present

## 2017-01-15 ENCOUNTER — Other Ambulatory Visit: Payer: Self-pay | Admitting: Physician Assistant

## 2017-01-15 DIAGNOSIS — K739 Chronic hepatitis, unspecified: Secondary | ICD-10-CM | POA: Diagnosis not present

## 2017-01-15 MED FILL — SUBOXONE 8 MG-2 MG SL FILM: 8-2 | 28 days supply | Qty: 84 | Fill #0

## 2017-01-15 MED FILL — PROMETHAZINE 25 MG TABLET: 25 | 30 days supply | Qty: 90 | Fill #0

## 2017-01-19 ENCOUNTER — Other Ambulatory Visit: Payer: Self-pay

## 2017-01-19 MED ORDER — GABAPENTIN 800 MG PO TABS: 800.0000 mg | ORAL_TABLET | Freq: Four times a day (QID) | ORAL | 0 refills | Status: DC

## 2017-02-07 DIAGNOSIS — J449 Chronic obstructive pulmonary disease, unspecified: Secondary | ICD-10-CM | POA: Diagnosis not present

## 2017-02-17 DIAGNOSIS — J9611 Chronic respiratory failure with hypoxia: Secondary | ICD-10-CM | POA: Diagnosis not present

## 2017-02-17 DIAGNOSIS — J449 Chronic obstructive pulmonary disease, unspecified: Secondary | ICD-10-CM | POA: Diagnosis not present

## 2017-02-17 DIAGNOSIS — G4733 Obstructive sleep apnea (adult) (pediatric): Secondary | ICD-10-CM | POA: Diagnosis not present

## 2017-02-19 DIAGNOSIS — K739 Chronic hepatitis, unspecified: Secondary | ICD-10-CM | POA: Diagnosis not present

## 2017-02-25 ENCOUNTER — Encounter: Payer: Self-pay | Admitting: Physician Assistant

## 2017-02-25 ENCOUNTER — Ambulatory Visit (INDEPENDENT_AMBULATORY_CARE_PROVIDER_SITE_OTHER): Payer: Medicare Other | Admitting: Physician Assistant

## 2017-02-25 VITALS — BP 123/77 | HR 87 | Temp 97.7°F | Ht 67.0 in | Wt 237.8 lb

## 2017-02-25 DIAGNOSIS — M5430 Sciatica, unspecified side: Secondary | ICD-10-CM | POA: Insufficient documentation

## 2017-02-25 DIAGNOSIS — N3281 Overactive bladder: Secondary | ICD-10-CM

## 2017-02-25 DIAGNOSIS — M461 Sacroiliitis, not elsewhere classified: Secondary | ICD-10-CM | POA: Diagnosis not present

## 2017-02-25 DIAGNOSIS — M5432 Sciatica, left side: Secondary | ICD-10-CM | POA: Diagnosis not present

## 2017-02-25 DIAGNOSIS — M1712 Unilateral primary osteoarthritis, left knee: Secondary | ICD-10-CM

## 2017-02-25 DIAGNOSIS — M5136 Other intervertebral disc degeneration, lumbar region: Secondary | ICD-10-CM | POA: Diagnosis not present

## 2017-02-25 DIAGNOSIS — Z8619 Personal history of other infectious and parasitic diseases: Secondary | ICD-10-CM | POA: Diagnosis not present

## 2017-02-25 MED ORDER — FLUTICASONE-UMECLIDIN-VILANT 100-62.5-25 MCG/INH IN AEPB: 1.0000 | INHALATION_SPRAY | Freq: Every day | RESPIRATORY_TRACT | Status: DC

## 2017-02-25 MED ORDER — MIRABEGRON ER 50 MG PO TB24: 50.0000 mg | ORAL_TABLET | Freq: Every day | ORAL | 11 refills | Status: DC

## 2017-02-25 NOTE — Patient Instructions (Signed)
In a few days you may receive a survey in the mail or online from [employer] regarding your visit with us today. Please take a moment to fill this out. Your feedback is very important to our whole office. It can help us better understand your needs as well as improve your experience and satisfaction. Thank you for taking your time to complete it. We care about you.  Analissa Bayless, PA-C  
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
## 2017-02-26 LAB — CMP14+EGFR
ALK PHOS: 121 IU/L — AB (ref 39–117)
ALT: 44 IU/L — AB (ref 0–32)
AST: 36 IU/L (ref 0–40)
Albumin/Globulin Ratio: 1.4 (ref 1.2–2.2)
Albumin: 4.3 g/dL (ref 3.5–5.5)
BUN/Creatinine Ratio: 5 — ABNORMAL LOW (ref 9–23)
BUN: 4 mg/dL — AB (ref 6–24)
Bilirubin Total: 0.2 mg/dL (ref 0.0–1.2)
CALCIUM: 10.7 mg/dL — AB (ref 8.7–10.2)
CO2: 24 mmol/L (ref 20–29)
CREATININE: 0.77 mg/dL (ref 0.57–1.00)
Chloride: 97 mmol/L (ref 96–106)
GFR calc Af Amer: 112 mL/min/{1.73_m2} (ref >59)
GFR, EST NON AFRICAN AMERICAN: 97 mL/min/{1.73_m2} (ref >59)
GLOBULIN, TOTAL: 3 g/dL (ref 1.5–4.5)
GLUCOSE: 384 mg/dL — AB (ref 65–99)
Potassium: 4.4 mmol/L (ref 3.5–5.2)
SODIUM: 139 mmol/L (ref 134–144)
Total Protein: 7.3 g/dL (ref 6.0–8.5)

## 2017-02-26 LAB — HEPATITIS C ANTIBODY

## 2017-03-05 ENCOUNTER — Encounter (INDEPENDENT_AMBULATORY_CARE_PROVIDER_SITE_OTHER): Payer: Self-pay | Admitting: Internal Medicine

## 2017-03-09 DIAGNOSIS — W19XXXA Unspecified fall, initial encounter: Secondary | ICD-10-CM | POA: Diagnosis not present

## 2017-03-09 DIAGNOSIS — Z79899 Other long term (current) drug therapy: Secondary | ICD-10-CM | POA: Diagnosis not present

## 2017-03-09 DIAGNOSIS — S8002XA Contusion of left knee, initial encounter: Secondary | ICD-10-CM | POA: Diagnosis not present

## 2017-03-09 DIAGNOSIS — M25462 Effusion, left knee: Secondary | ICD-10-CM | POA: Diagnosis not present

## 2017-03-10 DIAGNOSIS — J449 Chronic obstructive pulmonary disease, unspecified: Secondary | ICD-10-CM | POA: Diagnosis not present

## 2017-03-13 ENCOUNTER — Encounter: Payer: Self-pay | Admitting: Physician Assistant

## 2017-03-13 ENCOUNTER — Ambulatory Visit: Payer: Self-pay | Admitting: Physician Assistant

## 2017-03-13 ENCOUNTER — Ambulatory Visit (INDEPENDENT_AMBULATORY_CARE_PROVIDER_SITE_OTHER): Payer: Medicare Other | Admitting: Physician Assistant

## 2017-03-13 ENCOUNTER — Other Ambulatory Visit: Payer: Self-pay | Admitting: Physician Assistant

## 2017-03-13 VITALS — BP 153/103 | HR 90 | Temp 98.0°F | Ht 67.0 in | Wt 235.0 lb

## 2017-03-13 DIAGNOSIS — E7841 Elevated Lipoprotein(a): Secondary | ICD-10-CM | POA: Diagnosis not present

## 2017-03-13 DIAGNOSIS — I10 Essential (primary) hypertension: Secondary | ICD-10-CM

## 2017-03-13 LAB — BAYER DCA HB A1C WAIVED: HB A1C (BAYER DCA - WAIVED): 11.4 % — ABNORMAL HIGH (ref <7.0)

## 2017-03-13 MED ORDER — METFORMIN HCL 500 MG PO TABS: 500.0000 mg | ORAL_TABLET | Freq: Two times a day (BID) | ORAL | 3 refills | Status: DC

## 2017-03-13 MED ORDER — AMLODIPINE BESYLATE 10 MG PO TABS: 10.0000 mg | ORAL_TABLET | Freq: Every day | ORAL | 3 refills | Status: DC

## 2017-03-13 MED ORDER — GLIPIZIDE 5 MG PO TABS: 5.0000 mg | ORAL_TABLET | Freq: Every day | ORAL | 3 refills | Status: DC

## 2017-03-13 NOTE — Patient Instructions (Signed)
Call Dr Romeo AppleHarrison

## 2017-03-13 NOTE — Progress Notes (Signed)
metformin

## 2017-03-14 LAB — LIPID PANEL
CHOL/HDL RATIO: 5.7 ratio — AB (ref 0.0–4.4)
Cholesterol, Total: 166 mg/dL (ref 100–199)
HDL: 29 mg/dL — ABNORMAL LOW (ref >39)
LDL CALC: 71 mg/dL (ref 0–99)
TRIGLYCERIDES: 329 mg/dL — AB (ref 0–149)
VLDL Cholesterol Cal: 66 mg/dL — ABNORMAL HIGH (ref 5–40)

## 2017-03-14 LAB — THYROID PANEL WITH TSH
Free Thyroxine Index: 1.8 (ref 1.2–4.9)
T3 UPTAKE RATIO: 25 % (ref 24–39)
T4 TOTAL: 7.3 ug/dL (ref 4.5–12.0)
TSH: 1.26 u[IU]/mL (ref 0.450–4.500)

## 2017-03-15 ENCOUNTER — Other Ambulatory Visit: Payer: Self-pay | Admitting: Physician Assistant

## 2017-03-16 MED ORDER — GLUCOSE BLOOD VI STRP: ORAL_STRIP | 12 refills | Status: DC

## 2017-03-16 MED ORDER — ONETOUCH VERIO W/DEVICE KIT: 1.0000 | PACK | Freq: Two times a day (BID) | 0 refills | Status: AC

## 2017-03-16 MED ORDER — ONETOUCH ULTRASOFT LANCETS MISC: 12 refills | Status: DC
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
## 2017-03-16 NOTE — Telephone Encounter (Signed)
Patient aware of A1C results and Glucometer sent to pharmacy.

## 2017-03-17 ENCOUNTER — Ambulatory Visit: Payer: Medicare Other | Admitting: Physician Assistant

## 2017-03-17 ENCOUNTER — Telehealth (HOSPITAL_COMMUNITY): Payer: Self-pay

## 2017-03-17 NOTE — Telephone Encounter (Signed)
WR [facility]   Writer left a voice mail message.  

## 2017-03-19 ENCOUNTER — Ambulatory Visit (INDEPENDENT_AMBULATORY_CARE_PROVIDER_SITE_OTHER): Payer: Self-pay | Admitting: Internal Medicine

## 2017-03-19 DIAGNOSIS — K739 Chronic hepatitis, unspecified: Secondary | ICD-10-CM | POA: Diagnosis not present

## 2017-03-24 ENCOUNTER — Telehealth: Payer: Self-pay

## 2017-03-24 NOTE — Telephone Encounter (Signed)
WR [facility]   Writer left a voice mail message.  

## 2017-03-25 DIAGNOSIS — M461 Sacroiliitis, not elsewhere classified: Secondary | ICD-10-CM | POA: Diagnosis not present

## 2017-03-25 DIAGNOSIS — M1712 Unilateral primary osteoarthritis, left knee: Secondary | ICD-10-CM | POA: Diagnosis not present

## 2017-03-25 DIAGNOSIS — M5136 Other intervertebral disc degeneration, lumbar region: Secondary | ICD-10-CM | POA: Diagnosis not present

## 2017-03-25 DIAGNOSIS — M5432 Sciatica, left side: Secondary | ICD-10-CM | POA: Diagnosis not present

## 2017-03-25 DIAGNOSIS — R262 Difficulty in walking, not elsewhere classified: Secondary | ICD-10-CM | POA: Diagnosis not present

## 2017-03-26 ENCOUNTER — Ambulatory Visit (INDEPENDENT_AMBULATORY_CARE_PROVIDER_SITE_OTHER): Payer: Self-pay | Admitting: Internal Medicine

## 2017-03-27 DIAGNOSIS — M5136 Other intervertebral disc degeneration, lumbar region: Secondary | ICD-10-CM | POA: Diagnosis not present

## 2017-03-27 DIAGNOSIS — R262 Difficulty in walking, not elsewhere classified: Secondary | ICD-10-CM | POA: Diagnosis not present

## 2017-03-27 DIAGNOSIS — M1712 Unilateral primary osteoarthritis, left knee: Secondary | ICD-10-CM | POA: Diagnosis not present

## 2017-03-27 DIAGNOSIS — M5432 Sciatica, left side: Secondary | ICD-10-CM | POA: Diagnosis not present

## 2017-03-27 DIAGNOSIS — M461 Sacroiliitis, not elsewhere classified: Secondary | ICD-10-CM | POA: Diagnosis not present

## 2017-04-01 DIAGNOSIS — M5136 Other intervertebral disc degeneration, lumbar region: Secondary | ICD-10-CM | POA: Diagnosis not present

## 2017-04-01 DIAGNOSIS — R262 Difficulty in walking, not elsewhere classified: Secondary | ICD-10-CM | POA: Diagnosis not present

## 2017-04-01 DIAGNOSIS — M5432 Sciatica, left side: Secondary | ICD-10-CM | POA: Diagnosis not present

## 2017-04-01 DIAGNOSIS — M1712 Unilateral primary osteoarthritis, left knee: Secondary | ICD-10-CM | POA: Diagnosis not present

## 2017-04-01 DIAGNOSIS — M461 Sacroiliitis, not elsewhere classified: Secondary | ICD-10-CM | POA: Diagnosis not present

## 2017-04-03 DIAGNOSIS — M5136 Other intervertebral disc degeneration, lumbar region: Secondary | ICD-10-CM | POA: Diagnosis not present

## 2017-04-03 DIAGNOSIS — M1712 Unilateral primary osteoarthritis, left knee: Secondary | ICD-10-CM | POA: Diagnosis not present

## 2017-04-03 DIAGNOSIS — M5442 Lumbago with sciatica, left side: Secondary | ICD-10-CM | POA: Diagnosis not present

## 2017-04-04 ENCOUNTER — Telehealth: Payer: Self-pay

## 2017-04-04 NOTE — Telephone Encounter (Signed)
[facility] - left  Message.  

## 2017-04-08 ENCOUNTER — Encounter: Payer: Self-pay | Admitting: Orthopedic Surgery

## 2017-04-08 ENCOUNTER — Ambulatory Visit: Payer: Self-pay | Admitting: Orthopedic Surgery

## 2017-04-09 ENCOUNTER — Encounter (INDEPENDENT_AMBULATORY_CARE_PROVIDER_SITE_OTHER): Payer: Self-pay | Admitting: Internal Medicine

## 2017-04-09 ENCOUNTER — Encounter (INDEPENDENT_AMBULATORY_CARE_PROVIDER_SITE_OTHER): Payer: Self-pay | Admitting: *Deleted

## 2017-04-09 ENCOUNTER — Ambulatory Visit (INDEPENDENT_AMBULATORY_CARE_PROVIDER_SITE_OTHER): Payer: Medicare Other | Admitting: Internal Medicine

## 2017-04-09 VITALS — BP 140/100 | HR 68 | Temp 98.6°F | Ht 67.0 in | Wt 230.8 lb

## 2017-04-09 DIAGNOSIS — L818 Other specified disorders of pigmentation: Secondary | ICD-10-CM | POA: Diagnosis not present

## 2017-04-09 DIAGNOSIS — F1293 Cannabis use, unspecified with withdrawal: Secondary | ICD-10-CM | POA: Diagnosis not present

## 2017-04-09 DIAGNOSIS — B182 Chronic viral hepatitis C: Secondary | ICD-10-CM
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
## 2017-04-09 NOTE — Patient Instructions (Signed)
Labs and US abdomen.  

## 2017-04-10 DIAGNOSIS — J449 Chronic obstructive pulmonary disease, unspecified: Secondary | ICD-10-CM | POA: Diagnosis not present

## 2017-04-13 ENCOUNTER — Other Ambulatory Visit (INDEPENDENT_AMBULATORY_CARE_PROVIDER_SITE_OTHER): Payer: Self-pay | Admitting: *Deleted

## 2017-04-13 DIAGNOSIS — R768 Other specified abnormal immunological findings in serum: Secondary | ICD-10-CM

## 2017-04-14 ENCOUNTER — Telehealth: Payer: Self-pay

## 2017-04-14 DIAGNOSIS — K739 Chronic hepatitis, unspecified: Secondary | ICD-10-CM | POA: Diagnosis not present

## 2017-04-14 LAB — DRUGS OF ABUSE SCREEN W/O ALC, ROUTINE URINE
AMPHETAMINES (1000 NG/ML SCRN): NEGATIVE
BARBITURATES: NEGATIVE
BENZODIAZEPINES: POSITIVE — AB
COCAINE METABOLITES: POSITIVE — AB
HYDROCODONE: POSITIVE — AB
MARIJUANA MET (50 NG/ML SCRN): POSITIVE — AB
METHADONE: NEGATIVE
METHAQUALONE: NEGATIVE
OPIATES: POSITIVE — AB
PHENCYCLIDINE: NEGATIVE
PROPOXYPHENE: NEGATIVE

## 2017-04-14 LAB — PROTIME-INR
INR: 1
Prothrombin Time: 10.2 s (ref 9.0–11.5)

## 2017-04-14 LAB — HEPATITIS PANEL, ACUTE
HEP A IGM: NONREACTIVE
HEP B C IGM: NONREACTIVE
HEP B S AG: NONREACTIVE
Hepatitis C Ab: REACTIVE — AB
SIGNAL TO CUT-OFF: 36 — ABNORMAL HIGH (ref <1.00)

## 2017-04-14 LAB — HEPATITIS C GENOTYPE: HCV Genotype: NOT DETECTED

## 2017-04-14 LAB — HIV ANTIBODY (ROUTINE TESTING W REFLEX): HIV: NONREACTIVE

## 2017-04-14 LAB — AFP TUMOR MARKER: AFP TUMOR MARKER: 1.6 ng/mL

## 2017-04-14 LAB — HEPATITIS C RNA QUANTITATIVE
HCV QUANT LOG: NOT DETECTED {Log_IU}/mL
HCV RNA, PCR, QN: NOT DETECTED [IU]/mL

## 2017-04-14 NOTE — Telephone Encounter (Signed)
Writer left a voicemail message on 03/17/2017; 03/24/2017; 04/04/2017; and 04/14/2017.  Patient will be placed on the inactive list and the PCP and Dr. Vanetta ShawlHisada will be notified through in basket email routing.

## 2017-04-17 ENCOUNTER — Ambulatory Visit (HOSPITAL_COMMUNITY): Admission: RE | Admit: 2017-04-17 | Payer: Medicare Other | Source: Ambulatory Visit

## 2017-04-21 ENCOUNTER — Telehealth: Payer: Self-pay

## 2017-04-21 DIAGNOSIS — Z8659 Personal history of other mental and behavioral disorders: Secondary | ICD-10-CM

## 2017-04-21 DIAGNOSIS — F319 Bipolar disorder, unspecified: Secondary | ICD-10-CM

## 2017-04-21 DIAGNOSIS — F609 Personality disorder, unspecified: Secondary | ICD-10-CM
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
## 2017-04-22 ENCOUNTER — Telehealth (HOSPITAL_COMMUNITY): Payer: Self-pay

## 2017-04-22 NOTE — Telephone Encounter (Signed)
Writer informed patient that she will be placed on an inactive basis with the

## 2017-05-08 DIAGNOSIS — J449 Chronic obstructive pulmonary disease, unspecified: Secondary | ICD-10-CM | POA: Diagnosis not present

## 2017-05-09 ENCOUNTER — Other Ambulatory Visit: Payer: Self-pay | Admitting: Physician Assistant

## 2017-05-12 DIAGNOSIS — K739 Chronic hepatitis, unspecified: Secondary | ICD-10-CM | POA: Diagnosis not present

## 2017-05-22 ENCOUNTER — Other Ambulatory Visit (INDEPENDENT_AMBULATORY_CARE_PROVIDER_SITE_OTHER): Payer: Self-pay | Admitting: Internal Medicine

## 2017-05-25 ENCOUNTER — Encounter (HOSPITAL_COMMUNITY): Payer: Self-pay

## 2017-05-25 ENCOUNTER — Other Ambulatory Visit: Payer: Self-pay

## 2017-05-25 ENCOUNTER — Inpatient Hospital Stay (HOSPITAL_COMMUNITY)
Admission: EM | Admit: 2017-05-25 | Discharge: 2017-06-05 | DRG: 190 | Disposition: A | Discharge status: Home / Self Care | Payer: Medicare Other | Attending: Internal Medicine | Admitting: Internal Medicine

## 2017-05-25 ENCOUNTER — Emergency Department (HOSPITAL_COMMUNITY): Payer: Medicare Other

## 2017-05-25 DIAGNOSIS — Z888 Allergy status to other drugs, medicaments and biological substances status: Secondary | ICD-10-CM

## 2017-05-25 DIAGNOSIS — R05 Cough: Secondary | ICD-10-CM | POA: Diagnosis not present

## 2017-05-25 DIAGNOSIS — M797 Fibromyalgia: Secondary | ICD-10-CM | POA: Diagnosis not present

## 2017-05-25 DIAGNOSIS — Z8701 Personal history of pneumonia (recurrent): Secondary | ICD-10-CM

## 2017-05-25 DIAGNOSIS — F064 Anxiety disorder due to known physiological condition: Secondary | ICD-10-CM | POA: Diagnosis present

## 2017-05-25 DIAGNOSIS — J9622 Acute and chronic respiratory failure with hypercapnia: Secondary | ICD-10-CM | POA: Diagnosis not present

## 2017-05-25 DIAGNOSIS — E785 Hyperlipidemia, unspecified: Secondary | ICD-10-CM | POA: Diagnosis present

## 2017-05-25 DIAGNOSIS — J9601 Acute respiratory failure with hypoxia: Secondary | ICD-10-CM | POA: Diagnosis not present

## 2017-05-25 DIAGNOSIS — Z7984 Long term (current) use of oral hypoglycemic drugs: Secondary | ICD-10-CM

## 2017-05-25 DIAGNOSIS — E669 Obesity, unspecified: Secondary | ICD-10-CM | POA: Diagnosis present

## 2017-05-25 DIAGNOSIS — J9621 Acute and chronic respiratory failure with hypoxia: Secondary | ICD-10-CM | POA: Diagnosis not present

## 2017-05-25 DIAGNOSIS — E876 Hypokalemia: Secondary | ICD-10-CM | POA: Diagnosis not present

## 2017-05-25 DIAGNOSIS — F909 Attention-deficit hyperactivity disorder, unspecified type: Secondary | ICD-10-CM | POA: Diagnosis present

## 2017-05-25 DIAGNOSIS — J440 Chronic obstructive pulmonary disease with acute lower respiratory infection: Secondary | ICD-10-CM | POA: Diagnosis not present

## 2017-05-25 DIAGNOSIS — F319 Bipolar disorder, unspecified: Secondary | ICD-10-CM | POA: Diagnosis present

## 2017-05-25 DIAGNOSIS — I509 Heart failure, unspecified: Secondary | ICD-10-CM

## 2017-05-25 DIAGNOSIS — E871 Hypo-osmolality and hyponatremia: Secondary | ICD-10-CM | POA: Diagnosis present

## 2017-05-25 DIAGNOSIS — E7800 Pure hypercholesterolemia, unspecified: Secondary | ICD-10-CM | POA: Diagnosis present

## 2017-05-25 DIAGNOSIS — G8929 Other chronic pain: Secondary | ICD-10-CM | POA: Diagnosis present

## 2017-05-25 DIAGNOSIS — E119 Type 2 diabetes mellitus without complications: Secondary | ICD-10-CM | POA: Diagnosis not present

## 2017-05-25 DIAGNOSIS — Z90710 Acquired absence of both cervix and uterus: Secondary | ICD-10-CM

## 2017-05-25 DIAGNOSIS — E118 Type 2 diabetes mellitus with unspecified complications: Secondary | ICD-10-CM | POA: Diagnosis not present

## 2017-05-25 DIAGNOSIS — Z8673 Personal history of transient ischemic attack (TIA), and cerebral infarction without residual deficits: Secondary | ICD-10-CM

## 2017-05-25 DIAGNOSIS — Z7951 Long term (current) use of inhaled steroids: Secondary | ICD-10-CM

## 2017-05-25 DIAGNOSIS — F17210 Nicotine dependence, cigarettes, uncomplicated: Secondary | ICD-10-CM | POA: Diagnosis present

## 2017-05-25 DIAGNOSIS — Z6834 Body mass index (BMI) 34.0-34.9, adult: Secondary | ICD-10-CM

## 2017-05-25 DIAGNOSIS — Z9981 Dependence on supplemental oxygen: Secondary | ICD-10-CM | POA: Diagnosis not present

## 2017-05-25 DIAGNOSIS — Z79899 Other long term (current) drug therapy: Secondary | ICD-10-CM

## 2017-05-25 DIAGNOSIS — R569 Unspecified convulsions: Secondary | ICD-10-CM | POA: Diagnosis not present

## 2017-05-25 DIAGNOSIS — Z884 Allergy status to anesthetic agent status: Secondary | ICD-10-CM

## 2017-05-25 DIAGNOSIS — F17290 Nicotine dependence, other tobacco product, uncomplicated: Secondary | ICD-10-CM | POA: Diagnosis present

## 2017-05-25 DIAGNOSIS — F32A Depression, unspecified: Secondary | ICD-10-CM | POA: Diagnosis present

## 2017-05-25 DIAGNOSIS — F4310 Post-traumatic stress disorder, unspecified: Secondary | ICD-10-CM | POA: Diagnosis present

## 2017-05-25 DIAGNOSIS — Z794 Long term (current) use of insulin: Secondary | ICD-10-CM | POA: Diagnosis not present

## 2017-05-25 DIAGNOSIS — J181 Lobar pneumonia, unspecified organism: Secondary | ICD-10-CM | POA: Diagnosis not present

## 2017-05-25 DIAGNOSIS — Z9989 Dependence on other enabling machines and devices: Secondary | ICD-10-CM

## 2017-05-25 DIAGNOSIS — J441 Chronic obstructive pulmonary disease with (acute) exacerbation: Secondary | ICD-10-CM

## 2017-05-25 DIAGNOSIS — Z833 Family history of diabetes mellitus: Secondary | ICD-10-CM

## 2017-05-25 DIAGNOSIS — R51 Headache: Secondary | ICD-10-CM | POA: Diagnosis present

## 2017-05-25 DIAGNOSIS — I5031 Acute diastolic (congestive) heart failure: Secondary | ICD-10-CM | POA: Diagnosis present

## 2017-05-25 DIAGNOSIS — J449 Chronic obstructive pulmonary disease, unspecified: Secondary | ICD-10-CM | POA: Diagnosis not present

## 2017-05-25 DIAGNOSIS — I110 Hypertensive heart disease with heart failure: Secondary | ICD-10-CM | POA: Diagnosis not present

## 2017-05-25 DIAGNOSIS — J189 Pneumonia, unspecified organism: Secondary | ICD-10-CM | POA: Diagnosis present

## 2017-05-25 DIAGNOSIS — Z886 Allergy status to analgesic agent status: Secondary | ICD-10-CM

## 2017-05-25 DIAGNOSIS — F329 Major depressive disorder, single episode, unspecified: Secondary | ICD-10-CM | POA: Diagnosis present

## 2017-05-25 DIAGNOSIS — R0602 Shortness of breath: Secondary | ICD-10-CM | POA: Diagnosis present

## 2017-05-25 DIAGNOSIS — Z8249 Family history of ischemic heart disease and other diseases of the circulatory system: Secondary | ICD-10-CM

## 2017-05-25 HISTORY — DX: Essential (primary) hypertension: I10

## 2017-05-25 HISTORY — DX: Type 2 diabetes mellitus without complications: E11.9

## 2017-05-25 LAB — CBC WITH DIFFERENTIAL/PLATELET
BASOS ABS: 0 10*3/uL (ref 0.0–0.1)
Basophils Relative: 0 %
EOS PCT: 0 %
Eosinophils Absolute: 0.1 10*3/uL (ref 0.0–0.7)
HEMATOCRIT: 41.5 % (ref 36.0–46.0)
Hemoglobin: 14 g/dL (ref 12.0–15.0)
LYMPHS PCT: 10 %
Lymphs Abs: 1.8 10*3/uL (ref 0.7–4.0)
MCH: 28.7 pg (ref 26.0–34.0)
MCHC: 33.7 g/dL (ref 30.0–36.0)
MCV: 85.2 fL (ref 78.0–100.0)
MONOS PCT: 3 %
Monocytes Absolute: 0.5 10*3/uL (ref 0.1–1.0)
NEUTROS ABS: 15.1 10*3/uL — AB (ref 1.7–7.7)
Neutrophils Relative %: 87 %
PLATELETS: 198 10*3/uL (ref 150–400)
RBC: 4.87 MIL/uL (ref 3.87–5.11)
RDW: 13.4 % (ref 11.5–15.5)
WBC: 17.5 10*3/uL — ABNORMAL HIGH (ref 4.0–10.5)

## 2017-05-25 LAB — BASIC METABOLIC PANEL
ANION GAP: 15 (ref 5–15)
BUN: 5 mg/dL — AB (ref 6–20)
CALCIUM: 8.8 mg/dL — AB (ref 8.9–10.3)
CO2: 22 mmol/L (ref 22–32)
Chloride: 96 mmol/L — ABNORMAL LOW (ref 101–111)
Creatinine, Ser: 0.7 mg/dL (ref 0.44–1.00)
GFR calc Af Amer: >60 mL/min (ref >60)
GLUCOSE: 210 mg/dL — AB (ref 65–99)
Potassium: 3 mmol/L — ABNORMAL LOW (ref 3.5–5.1)
Sodium: 133 mmol/L — ABNORMAL LOW (ref 135–145)

## 2017-05-25 LAB — PHOSPHORUS: Phosphorus: 2.9 mg/dL (ref 2.5–4.6)

## 2017-05-25 LAB — MAGNESIUM: Magnesium: 1.3 mg/dL — ABNORMAL LOW (ref 1.7–2.4)

## 2017-05-25 MED ORDER — METHYLPREDNISOLONE SODIUM SUCC 125 MG IJ SOLR
125.0000 mg | Freq: Once | INTRAMUSCULAR | Status: AC
  Administered 2017-05-25: 125 mg via INTRAVENOUS
  Filled 2017-05-25: qty 2

## 2017-05-25 MED ORDER — SODIUM CHLORIDE 0.9 % IV BOLUS
500.0000 mL | Freq: Once | INTRAVENOUS | Status: AC
  Administered 2017-05-25: 500 mL via INTRAVENOUS

## 2017-05-25 MED ORDER — ALBUTEROL SULFATE (2.5 MG/3ML) 0.083% IN NEBU
5.0000 mg | INHALATION_SOLUTION | Freq: Once | RESPIRATORY_TRACT | Status: AC
  Administered 2017-05-25: 5 mg via RESPIRATORY_TRACT
  Filled 2017-05-25: qty 6

## 2017-05-25 MED ORDER — SODIUM CHLORIDE 0.9 % IV SOLN
500.0000 mg | INTRAVENOUS | Status: DC
  Administered 2017-05-25 – 2017-05-28 (×3): 500 mg via INTRAVENOUS
  Filled 2017-05-25 (×4): qty 500

## 2017-05-25 MED ORDER — SODIUM CHLORIDE 0.9 % IV SOLN
1.0000 g | Freq: Once | INTRAVENOUS | Status: AC
  Administered 2017-05-25: 1 g via INTRAVENOUS
  Filled 2017-05-25: qty 10

## 2017-05-25 MED ORDER — POTASSIUM CHLORIDE CRYS ER 20 MEQ PO TBCR
40.0000 meq | EXTENDED_RELEASE_TABLET | Freq: Once | ORAL | Status: AC
  Administered 2017-05-25: 40 meq via ORAL
  Filled 2017-05-25: qty 2

## 2017-05-25 NOTE — ED Triage Notes (Signed)
Pt reports she took 4 puffs off of inhaler.
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
## 2017-05-25 NOTE — ED Triage Notes (Signed)
Pt reports SOB that started yesterday. Pt reports she started smoking again about a month ago. Pt reports using albuterol HFA with little relief. Pt productive cough "so intense she vomits." Pt able to speak full sentences in triage. Pt reports being out of nebulizer treatment.

## 2017-05-26 ENCOUNTER — Encounter (HOSPITAL_COMMUNITY): Payer: Self-pay | Admitting: Internal Medicine

## 2017-05-26 DIAGNOSIS — Z9989 Dependence on other enabling machines and devices: Secondary | ICD-10-CM

## 2017-05-26 DIAGNOSIS — E119 Type 2 diabetes mellitus without complications: Secondary | ICD-10-CM

## 2017-05-26 LAB — CBC WITH DIFFERENTIAL/PLATELET
BASOS PCT: 0 %
Basophils Absolute: 0 10*3/uL (ref 0.0–0.1)
EOS ABS: 0 10*3/uL (ref 0.0–0.7)
Eosinophils Relative: 0 %
HCT: 42.5 % (ref 36.0–46.0)
HEMOGLOBIN: 14.5 g/dL (ref 12.0–15.0)
Lymphocytes Relative: 9 %
Lymphs Abs: 1.3 10*3/uL (ref 0.7–4.0)
MCH: 29.5 pg (ref 26.0–34.0)
MCHC: 34.1 g/dL (ref 30.0–36.0)
MCV: 86.6 fL (ref 78.0–100.0)
Monocytes Absolute: 0.1 10*3/uL (ref 0.1–1.0)
Monocytes Relative: 1 %
NEUTROS PCT: 90 %
Neutro Abs: 13 10*3/uL — ABNORMAL HIGH (ref 1.7–7.7)
Platelets: 180 10*3/uL (ref 150–400)
RBC: 4.91 MIL/uL (ref 3.87–5.11)
RDW: 13.5 % (ref 11.5–15.5)
WBC: 14.5 10*3/uL — ABNORMAL HIGH (ref 4.0–10.5)

## 2017-05-26 LAB — BLOOD GAS, ARTERIAL
ACID-BASE DEFICIT: 1 mmol/L (ref 0.0–2.0)
Bicarbonate: 23.2 mmol/L (ref 20.0–28.0)
Drawn by: 382351
FIO2: 44
O2 SAT: 84 %
PO2 ART: 51.4 mmHg — AB (ref 83.0–108.0)
Patient temperature: 37
pCO2 arterial: 40.2 mmHg (ref 32.0–48.0)
pH, Arterial: 7.383 (ref 7.350–7.450)

## 2017-05-26 LAB — RAPID URINE DRUG SCREEN, HOSP PERFORMED
Amphetamines: NOT DETECTED
BENZODIAZEPINES: POSITIVE — AB
Barbiturates: NOT DETECTED
Cocaine: NOT DETECTED
Opiates: NOT DETECTED
Tetrahydrocannabinol: POSITIVE — AB

## 2017-05-26 LAB — GLUCOSE, CAPILLARY
GLUCOSE-CAPILLARY: 264 mg/dL — AB (ref 65–99)
GLUCOSE-CAPILLARY: 282 mg/dL — AB (ref 65–99)

## 2017-05-26 LAB — CBG MONITORING, ED
GLUCOSE-CAPILLARY: 427 mg/dL — AB (ref 65–99)
GLUCOSE-CAPILLARY: 361 mg/dL — AB (ref 65–99)
Glucose-Capillary: 328 mg/dL — ABNORMAL HIGH (ref 65–99)

## 2017-05-26 LAB — INFLUENZA PANEL BY PCR (TYPE A & B)
INFLAPCR: NEGATIVE
Influenza B By PCR: NEGATIVE

## 2017-05-26 LAB — STREP PNEUMONIAE URINARY ANTIGEN: Strep Pneumo Urinary Antigen: NEGATIVE

## 2017-05-26 LAB — MRSA PCR SCREENING: MRSA BY PCR: NEGATIVE

## 2017-05-26 MED ORDER — INSULIN ASPART 100 UNIT/ML ~~LOC~~ SOLN
0.0000 [IU] | Freq: Three times a day (TID) | SUBCUTANEOUS | Status: DC
  Administered 2017-05-26: 15 [IU] via SUBCUTANEOUS
  Administered 2017-05-26: 20 [IU] via SUBCUTANEOUS
  Administered 2017-05-26: 11 [IU] via SUBCUTANEOUS
  Filled 2017-05-26 (×2): qty 1

## 2017-05-26 MED ORDER — POTASSIUM CHLORIDE IN NACL 20-0.9 MEQ/L-% IV SOLN
INTRAVENOUS | Status: DC
  Administered 2017-05-26 – 2017-05-27 (×2): via INTRAVENOUS
  Filled 2017-05-26: qty 1000

## 2017-05-26 MED ORDER — ALBUTEROL SULFATE (2.5 MG/3ML) 0.083% IN NEBU: 2.5000 mg | INHALATION_SOLUTION | Freq: Four times a day (QID) | RESPIRATORY_TRACT | Status: DC

## 2017-05-26 MED ORDER — FLUOXETINE HCL 20 MG PO CAPS
40.0000 mg | ORAL_CAPSULE | Freq: Every day | ORAL | Status: DC
  Administered 2017-05-26 – 2017-06-05 (×11): 40 mg via ORAL
  Filled 2017-05-26 (×12): qty 2

## 2017-05-26 MED ORDER — LORAZEPAM 2 MG/ML IJ SOLN: 1.0000 mg | Freq: Once | INTRAMUSCULAR | Status: DC | PRN

## 2017-05-26 MED ORDER — METHYLPREDNISOLONE SODIUM SUCC 40 MG IJ SOLR
40.0000 mg | Freq: Four times a day (QID) | INTRAMUSCULAR | Status: AC
  Administered 2017-05-26 – 2017-05-27 (×4): 40 mg via INTRAVENOUS
  Filled 2017-05-26 (×4): qty 1

## 2017-05-26 MED ORDER — METFORMIN HCL 500 MG PO TABS
500.0000 mg | ORAL_TABLET | Freq: Two times a day (BID) | ORAL | Status: DC
  Administered 2017-05-26 (×2): 500 mg via ORAL
  Filled 2017-05-26 (×2): qty 1

## 2017-05-26 MED ORDER — SODIUM CHLORIDE 0.9 % IV SOLN
1.0000 g | INTRAVENOUS | Status: AC
  Administered 2017-05-27 – 2017-06-02 (×7): 1 g via INTRAVENOUS
  Filled 2017-05-26 (×2): qty 1
  Filled 2017-05-26: qty 10
  Filled 2017-05-26 (×4): qty 1

## 2017-05-26 MED ORDER — IPRATROPIUM-ALBUTEROL 0.5-2.5 (3) MG/3ML IN SOLN
3.0000 mL | Freq: Four times a day (QID) | RESPIRATORY_TRACT | Status: DC
  Administered 2017-05-26 – 2017-05-27 (×7): 3 mL via RESPIRATORY_TRACT
  Filled 2017-05-26 (×6): qty 3

## 2017-05-26 MED ORDER — BUPRENORPHINE HCL 2 MG SL SUBL
8.0000 mg | SUBLINGUAL_TABLET | Freq: Every day | SUBLINGUAL | Status: DC
  Administered 2017-05-26: 8 mg via SUBLINGUAL
  Filled 2017-05-26 (×2): qty 4

## 2017-05-26 MED ORDER — IPRATROPIUM BROMIDE 0.02 % IN SOLN: 0.5000 mg | Freq: Four times a day (QID) | RESPIRATORY_TRACT | Status: DC

## 2017-05-26 MED ORDER — ALBUTEROL SULFATE (2.5 MG/3ML) 0.083% IN NEBU
2.5000 mg | INHALATION_SOLUTION | RESPIRATORY_TRACT | Status: DC | PRN
  Administered 2017-05-29: 2.5 mg via RESPIRATORY_TRACT
  Filled 2017-05-26: qty 3

## 2017-05-26 MED ORDER — BUPRENORPHINE HCL 2 MG SL SUBL
2.0000 mg | SUBLINGUAL_TABLET | Freq: Three times a day (TID) | SUBLINGUAL | Status: DC
  Administered 2017-05-26 – 2017-05-27 (×3): 2 mg via SUBLINGUAL
  Filled 2017-05-26 (×3): qty 1

## 2017-05-26 MED ORDER — GABAPENTIN 400 MG PO CAPS
800.0000 mg | ORAL_CAPSULE | Freq: Four times a day (QID) | ORAL | Status: DC
  Administered 2017-05-26 – 2017-06-05 (×40): 800 mg via ORAL
  Filled 2017-05-26 (×40): qty 2

## 2017-05-26 MED ORDER — AMLODIPINE BESYLATE 5 MG PO TABS
10.0000 mg | ORAL_TABLET | Freq: Every day | ORAL | Status: DC
  Administered 2017-05-26 – 2017-06-05 (×11): 10 mg via ORAL
  Filled 2017-05-26 (×12): qty 2

## 2017-05-26 MED ORDER — MIRABEGRON ER 25 MG PO TB24
50.0000 mg | ORAL_TABLET | Freq: Every day | ORAL | Status: DC
  Administered 2017-05-26 – 2017-06-05 (×11): 50 mg via ORAL
  Filled 2017-05-26 (×10): qty 2
  Filled 2017-05-26: qty 1
  Filled 2017-05-26 (×3): qty 2
  Filled 2017-05-26: qty 1

## 2017-05-26 MED ORDER — GLIPIZIDE 5 MG PO TABS
5.0000 mg | ORAL_TABLET | Freq: Every day | ORAL | Status: DC
  Administered 2017-05-26: 5 mg via ORAL
  Filled 2017-05-26 (×2): qty 1

## 2017-05-26 MED ORDER — ENOXAPARIN SODIUM 40 MG/0.4ML ~~LOC~~ SOLN
40.0000 mg | SUBCUTANEOUS | Status: DC
  Administered 2017-05-26 – 2017-06-05 (×11): 40 mg via SUBCUTANEOUS
  Filled 2017-05-26 (×11): qty 0.4

## 2017-05-26 MED ORDER — MAGNESIUM SULFATE 4 GM/100ML IV SOLN
4.0000 g | Freq: Once | INTRAVENOUS | Status: AC
  Administered 2017-05-26: 4 g via INTRAVENOUS
  Filled 2017-05-26: qty 100

## 2017-05-26 MED ORDER — DIAZEPAM 5 MG PO TABS
5.0000 mg | ORAL_TABLET | Freq: Three times a day (TID) | ORAL | Status: DC | PRN
  Administered 2017-05-27 – 2017-06-03 (×8): 5 mg via ORAL
  Filled 2017-05-26 (×8): qty 1

## 2017-05-26 MED ORDER — ZIPRASIDONE HCL 80 MG PO CAPS
80.0000 mg | ORAL_CAPSULE | Freq: Two times a day (BID) | ORAL | Status: DC
  Administered 2017-05-26 – 2017-06-05 (×20): 80 mg via ORAL
  Filled 2017-05-26 (×28): qty 1

## 2017-05-26 NOTE — Progress Notes (Signed)
Inpatient Diabetes Program Recommendations  AACE/ADA: New Consensus Statement on Inpatient Glycemic Control (2015)  Target Ranges:  Prepandial:   less than 140 mg/dL      Peak postprandial:   less than 180 mg/dL (1-2 hours)      Critically ill patients:  140 - 180 mg/dL   Results for Latasha [name], Latasha [name] (MRN [id number]) as of 05/26/2017 13:36  Ref. Range 05/26/2017 08:16 05/26/2017 11:51 05/26/2017 13:13  Glucose-Capillary Latest Ref Range: 65 - 99 mg/dL 409328 (H)  15 units Novolog  427 (H) 361 (H)  20 units Novolog     Admit PNA  History: DM2  Home DM Meds: Metformin 500 mg BID        Glipizide 5 mg daily  Current Orders: Novolog Resistant Correction Scale/ SSI (0-20 units) TID AC      Metformin 500 mg BID       Glipizide 5 mg daily        Note patient received 125 mg Solumedrol last PM and now getting Solumedrol 40 mg Q6H.   Novolog SSI and Oral DM meds started this AM.  CBGs elevated >300 mg/dl.     MD- Please consider the following:  1. Start Lantus 15 units daily (please start today) [0.15 units/kg dosing]  2. Start Novolog Meal Coverage: Novolog 6 units TID with meals (hold if pt eats <50% of meal)  3. Consider checking current Hemoglobin A1c level- Last one was 11.4% back on 03/13/2017      --Will follow patient during hospitalization--  Ambrose FinlandJeannine Johnston Clenton Esper RN, MSN, CDE Diabetes Coordinator Inpatient Glycemic Control Team Team Pager: [phone] (8a-5p)   --Will follow patient during hospitalization--  Ambrose FinlandJeannine Johnston Jahbari Repinski RN, MSN, CDE Diabetes Coordinator Inpatient Glycemic Control Team Team Pager: [phone] (8a-5p)

## 2017-05-26 NOTE — ED Notes (Signed)
NT and RN performed Bladder Scan prior to and after In and Out Cath. Prior to I&O Cath, Urine Volume Measured . Post-I&O Cath, Urine Measured 30ml.

## 2017-05-26 NOTE — ED Notes (Signed)
States nurse will have to call back to get report

## 2017-05-26 NOTE — ED Notes (Signed)
Pt off bi-pap.  Tolerating high flow oxygen Oto .  Pt no visible distress.  [facility]

## 2017-05-26 NOTE — ED Notes (Signed)
Per respiratory, the pt's oxygen requirement is to high to be ambulated at this time.

## 2017-05-26 NOTE — Progress Notes (Signed)
Patient seen and examined, database reviewed.  Admitted earlier today due to shortness of breath.  She has a history of COPD, tobacco abuse, anxiety and PTSD.  She states that for 2-3 days she has been having cough with yellow sputum production, fatigue as well as malaise.  She has recently started smoking again.  She is significantly wheezing and rhonchorous on exam today and is requiring a much higher oxygen level than what she is used to using at home.  Continue Rocephin, azithromycin for presumptive pneumonia based on chest x-ray, nebs.  Did need BiPAP transiently in the ED so will be admitted to ICU for close monitoring.  Of note, last year she was hospitalized for COPD exacerbation and required intubation and mechanical ventilation.  Latasha PittEstela Hernandez, MD [facility] Pager: [phone]

## 2017-05-27 DIAGNOSIS — Z9989 Dependence on other enabling machines and devices: Secondary | ICD-10-CM

## 2017-05-27 DIAGNOSIS — E876 Hypokalemia: Secondary | ICD-10-CM

## 2017-05-27 LAB — COMPREHENSIVE METABOLIC PANEL
ALK PHOS: 100 U/L (ref 38–126)
ALT: 30 U/L (ref 14–54)
AST: 41 U/L (ref 15–41)
Albumin: 3.4 g/dL — ABNORMAL LOW (ref 3.5–5.0)
Anion gap: 14 (ref 5–15)
BUN: 11 mg/dL (ref 6–20)
CALCIUM: 9.1 mg/dL (ref 8.9–10.3)
CO2: 23 mmol/L (ref 22–32)
CREATININE: 0.69 mg/dL (ref 0.44–1.00)
Chloride: 100 mmol/L — ABNORMAL LOW (ref 101–111)
Glucose, Bld: 262 mg/dL — ABNORMAL HIGH (ref 65–99)
Potassium: 4 mmol/L (ref 3.5–5.1)
SODIUM: 137 mmol/L (ref 135–145)
Total Bilirubin: 0.6 mg/dL (ref 0.3–1.2)
Total Protein: 7.4 g/dL (ref 6.5–8.1)

## 2017-05-27 LAB — MAGNESIUM: MAGNESIUM: 2.1 mg/dL (ref 1.7–2.4)

## 2017-05-27 LAB — GLUCOSE, CAPILLARY
GLUCOSE-CAPILLARY: 224 mg/dL — AB (ref 65–99)
Glucose-Capillary: 309 mg/dL — ABNORMAL HIGH (ref 65–99)
Glucose-Capillary: 285 mg/dL — ABNORMAL HIGH (ref 65–99)
Glucose-Capillary: 251 mg/dL — ABNORMAL HIGH (ref 65–99)

## 2017-05-27 LAB — HEMOGLOBIN A1C
HEMOGLOBIN A1C: 9.9 % — AB (ref 4.8–5.6)
Mean Plasma Glucose: 237.43 mg/dL

## 2017-05-27 MED ORDER — INSULIN ASPART 100 UNIT/ML ~~LOC~~ SOLN
0.0000 [IU] | Freq: Three times a day (TID) | SUBCUTANEOUS | Status: DC
  Administered 2017-05-27: 7 [IU] via SUBCUTANEOUS
  Administered 2017-05-27: 15 [IU] via SUBCUTANEOUS
  Administered 2017-05-27 – 2017-05-28 (×2): 11 [IU] via SUBCUTANEOUS
  Administered 2017-05-28 – 2017-05-29 (×2): 4 [IU] via SUBCUTANEOUS
  Administered 2017-05-29: 7 [IU] via SUBCUTANEOUS
  Administered 2017-05-29: 11 [IU] via SUBCUTANEOUS
  Administered 2017-05-30: 7 [IU] via SUBCUTANEOUS
  Administered 2017-05-30: 4 [IU] via SUBCUTANEOUS
  Administered 2017-05-31: 7 [IU] via SUBCUTANEOUS
  Administered 2017-06-01: 3 [IU] via SUBCUTANEOUS
  Administered 2017-06-01: 7 [IU] via SUBCUTANEOUS
  Administered 2017-06-01: 3 [IU] via SUBCUTANEOUS
  Administered 2017-06-02: 11 [IU] via SUBCUTANEOUS
  Administered 2017-06-03 (×2): 3 [IU] via SUBCUTANEOUS
  Administered 2017-06-04: 11 [IU] via SUBCUTANEOUS
  Administered 2017-06-04: 3 [IU] via SUBCUTANEOUS

## 2017-05-27 MED ORDER — BUPRENORPHINE HCL 2 MG SL SUBL
8.0000 mg | SUBLINGUAL_TABLET | Freq: Three times a day (TID) | SUBLINGUAL | Status: DC
  Administered 2017-05-28 – 2017-06-05 (×26): 8 mg via SUBLINGUAL
  Filled 2017-05-27 (×26): qty 4

## 2017-05-27 MED ORDER — METHYLPREDNISOLONE SODIUM SUCC 40 MG IJ SOLR
40.0000 mg | Freq: Four times a day (QID) | INTRAMUSCULAR | Status: DC
  Administered 2017-05-27 – 2017-05-28 (×4): 40 mg via INTRAVENOUS
  Filled 2017-05-27 (×3): qty 1

## 2017-05-27 MED ORDER — INSULIN GLARGINE 100 UNIT/ML ~~LOC~~ SOLN
15.0000 [IU] | Freq: Every day | SUBCUTANEOUS | Status: DC
  Administered 2017-05-27: 15 [IU] via SUBCUTANEOUS
  Filled 2017-05-27 (×2): qty 0.15

## 2017-05-27 MED ORDER — INSULIN ASPART 100 UNIT/ML ~~LOC~~ SOLN
6.0000 [IU] | Freq: Three times a day (TID) | SUBCUTANEOUS | Status: DC
  Administered 2017-05-27 (×3): 6 [IU] via SUBCUTANEOUS

## 2017-05-27 MED ORDER — ACETAMINOPHEN 325 MG PO TABS
650.0000 mg | ORAL_TABLET | Freq: Four times a day (QID) | ORAL | Status: DC | PRN
  Administered 2017-05-27 – 2017-06-03 (×9): 650 mg via ORAL
  Filled 2017-05-27 (×9): qty 2

## 2017-05-27 MED ORDER — INSULIN ASPART 100 UNIT/ML ~~LOC~~ SOLN
0.0000 [IU] | Freq: Every day | SUBCUTANEOUS | Status: DC
  Administered 2017-05-28 (×2): 3 [IU] via SUBCUTANEOUS
  Administered 2017-05-29 – 2017-05-30 (×2): 2 [IU] via SUBCUTANEOUS
  Administered 2017-06-01: 3 [IU] via SUBCUTANEOUS

## 2017-05-27 MED ORDER — IPRATROPIUM-ALBUTEROL 0.5-2.5 (3) MG/3ML IN SOLN
3.0000 mL | RESPIRATORY_TRACT | Status: DC
Start: 2017-05-27 — End: 2017-06-01
  Administered 2017-05-27 – 2017-06-01 (×30): 3 mL via RESPIRATORY_TRACT
  Filled 2017-05-27 (×31): qty 3

## 2017-05-27 NOTE — Progress Notes (Addendum)
Nutrition Brief Note  RD consulted as part of COPD protocol. Spoke with pt at bedside who denies any recent weight loss. States she does not have much of an appetite but still eats "whatever is available." Pt reports having eggs, toast, and milk for breakfast this morning.  Wt Readings from Last 15 Encounters:  05/27/17 228 lb 9.9 oz (103.7 kg)  04/09/17 230 lb 12.8 oz (104.7 kg)  03/13/17 235 lb (106.6 kg)  02/25/17 237 lb 12.8 oz (107.9 kg)  11/25/16 225 lb 9.6 oz (102.3 kg)  11/24/16 222 lb 6 oz (100.9 kg)  11/08/16 215 lb 8 oz (97.8 kg)  09/20/16 220 lb (99.8 kg)  04/25/14 219 lb (99.3 kg)  09/07/13 216 lb (98 kg)  05/26/13 220 lb (99.8 kg)  04/15/13 220 lb (99.8 kg)  04/07/13 217 lb (98.4 kg)  03/14/13 217 lb (98.4 kg)  03/08/13 232 lb (105.2 kg)    Body mass index is 35.81 kg/m. Patient meets criteria for Obesity Class II based on current BMI.   Current diet order is Heart Healthy/Carb Modified. No meal completion logged in chart at this time. Labs and medications reviewed.  Addendum: no subcutaneous fat or muscle wasting noted on nutrition-focused physical exam  No nutrition interventions warranted at this time. If nutrition issues arise, please consult RD.   Latasha ReadingKate Jablonski Andrews Tener, MS, RD, LDN Pager: [phone] Weekend/After Hours: [phone]

## 2017-05-27 NOTE — Progress Notes (Signed)
PROGRESS NOTE    Latasha [name]  ZOX:[id number]  DOB: 03/24/1975  DOA: 05/25/2017 PCP: Remus LofflerJones, Angel S, PA-C  Brief Admission Hx: Latasha [name] is a 42 y.o. female with medical history significant of ADHD, anxiety and depression, bipolar disorder, PTSD, osteoarthritis, asthma, chronic neck, back and hip pain, degenerative disc disease, fibromyalgia, history of headaches, hyperlipidemia, history of stroke, type 2 diabetes, history of pneumonia, COPD, tobacco use who is coming to the emergency department with complaints of dyspnea, associated with yellowish sputum producing cough (which occasionally makes her vomit), fatigue and malaise.   MDM/Assessment & Plan:   CAP (community acquired pneumonia) COPD with acute exacerbation  Continue care in stepdown unit.  Continue supplemental oxygen. BiPAP ventilation as needed. Continue bronchodilators. Solu-Medrol 40 mg IVP times 1 day. Continue azithromycin 500 mg IVPB every 24 hours. Continue ceftriaxone 1 g IVPB every 24 hours.  Hypokalemia Replaced. Follow-up potassium level.  Hypomagnesemia. Replacing.  Hypertension Continue amlodipine 10 mg p.o. daily. Monitor blood pressure and heart rate.  Type 2 diabetes mellitus  Added basal / bolus / supplemental insulin and will follow and titrate as needed CBG (last 3)  Recent Labs    05/26/17 2104 05/27/17 0842 05/27/17 1143  GLUCAP 282* 309* 285*      Depression   Bipolar disorder (HCC) Continue fluoxetine 40 mg p.o. daily. Continue Geodon 80 mg p.o. twice daily. Continue diazepam 5 mg p.o. every 8 hours as needed.     Hyperlipidemia Not on medical therapy.    Chronic pain Continue gabapentin. Continue Suboxone or formulary equivalent.  DVT prophylaxis: Lovenox SQ. Code Status: Full code. Family Communication:  Disposition Plan: Admit for IV antibiotic therapy and COPD exacerbation treatment.  Subjective: Pt says she is still coughing and wheezing a lot.  She  is not resting well.   Objective: Vitals:   05/27/17 0600 05/27/17 0759 05/27/17 0800 05/27/17 1139  BP: 120/78  133/66   Pulse: (!) 105  98   Resp: (!) 24  (!) 28   Temp:      TempSrc:      SpO2: (!) 86% 96% 96% 91%  Weight:      Height:        Intake/Output Summary (Last 24 hours) at 05/27/2017 1256 Last data filed at 05/27/2017 0500 Gross per 24 hour  Intake 3028.33 ml  Output 400 ml  Net 2628.33 ml   Filed Weights   05/25/17 2040 05/27/17 0500  Weight: 104.3 kg (230 lb) 103.7 kg (228 lb 9.9 oz)   REVIEW OF SYSTEMS  As per history otherwise all reviewed and reported negative  Exam:  General exam: awake, alert, NAD, on bipap.  Respiratory system: diffuse wheezing  Cardiovascular system: S1 & S2 heard, RRR. No JVD, murmurs, gallops, clicks or pedal edema. Gastrointestinal system: Abdomen is nondistended, soft and nontender. Normal bowel sounds heard. Central nervous system: Alert and oriented. No focal neurological deficits. Extremities: no CCE.  Data Reviewed: Basic Metabolic Panel: Recent Labs  Lab 05/25/17 2230  NA 133*  K 3.0*  CL 96*  CO2 22  GLUCOSE 210*  BUN 5*  CREATININE 0.70  CALCIUM 8.8*  MG 1.3*  PHOS 2.9   Liver Function Tests: No results for input(s): AST, ALT, ALKPHOS, BILITOT, PROT, ALBUMIN in the last 168 hours. No results for input(s): LIPASE, AMYLASE in the last 168 hours. No results for input(s): AMMONIA in the last 168 hours. CBC: Recent Labs  Lab 05/25/17 2230 05/26/17 0653  WBC 17.5* 14.5*  NEUTROABS 15.1* 13.0*  HGB 14.0 14.5  HCT 41.5 42.5  MCV 85.2 86.6  PLT 198 180   Cardiac Enzymes: No results for input(s): CKTOTAL, CKMB, CKMBINDEX, TROPONINI in the last 168 hours. CBG (last 3)  Recent Labs    05/26/17 2104 05/27/17 0842 05/27/17 1143  GLUCAP 282* 309* 285*   Recent Results (from the past 240 hour(s))  MRSA PCR Screening     Status: None   Collection Time: 05/26/17  2:31 PM  Result Value Ref Range Status    MRSA by PCR NEGATIVE NEGATIVE Final    Comment:        The GeneXpert MRSA Assay (FDA approved for NASAL specimens only), is one component of a comprehensive MRSA colonization surveillance program. It is not intended to diagnose MRSA infection nor to guide or monitor treatment for MRSA infections. Performed at [facility], [address], [place], [place] [address]     Studies: Dg Chest [facility] 1 View  Result Date: 05/25/2017 CLINICAL DATA:  42 year old female with cough.  History of COPD. EXAM: PORTABLE CHEST 1 VIEW COMPARISON:  Chest radiograph dated 11/24/2016 FINDINGS: There is emphysematous changes of the lungs with interstitial coarsening. There is overall slight prominence of the interstitial markings compared to the prior radiograph which may represent superimposed edema, although pneumonia is not excluded. Clinical correlation is recommended. There is no focal consolidation, pleural effusion, or pneumothorax. Stable cardiac silhouette. No acute osseous pathology. IMPRESSION: COPD changes with possible superimposed edema or pneumonia. Clinical correlation is recommended. Electronically Signed   By: Elgie Collard M.D.   On: 05/25/2017 22:38   Scheduled Meds: . amLODipine  10 mg Oral Daily  . buprenorphine  2 mg Sublingual TID  . enoxaparin (LOVENOX) injection  40 mg Subcutaneous Q24H  . FLUoxetine  40 mg Oral Daily  . gabapentin  800 mg Oral QID  . insulin aspart  0-20 Units Subcutaneous TID WC  . insulin aspart  0-5 Units Subcutaneous QHS  . insulin aspart  6 Units Subcutaneous TID WC  . insulin glargine  15 Units Subcutaneous Daily  . ipratropium-albuterol  3 mL Nebulization Q6H  . methylPREDNISolone (SOLU-MEDROL) injection  40 mg Intravenous Q6H  . mirabegron ER  50 mg Oral Daily  . ziprasidone  80 mg Oral BID WC   Continuous Infusions: . 0.9 % NaCl with KCl 20 mEq / L 100 mL/hr at 05/27/17 0500  . azithromycin Stopped (05/27/17 0222)  . cefTRIAXone (ROCEPHIN)  IV  Stopped (05/27/17 0153)    Principal Problem:   CAP (community acquired pneumonia) Active Problems:   Hypokalemia   Depression   COPD with acute exacerbation (HCC)   Bipolar disorder (HCC)   Hyperlipidemia   Hypomagnesemia   BiPAP (biphasic positive airway pressure) dependence   Type 2 diabetes mellitus [facility])  Critical Care Time spent: 32 mins  Standley Dakins, MD, FAAFP [facility] Pager [phone]  If 7PM-7AM, please contact night-coverage [email or web address] Password [email or web address] 05/27/2017, 12:56 PM    LOS: 2 days

## 2017-05-27 NOTE — Progress Notes (Signed)
HFNC flow increased from 30% to 35% due to spo2 90% RN Fayrene FearingJames informed

## 2017-05-28 ENCOUNTER — Inpatient Hospital Stay (HOSPITAL_COMMUNITY): Payer: Medicare Other

## 2017-05-28 DIAGNOSIS — E785 Hyperlipidemia, unspecified: Secondary | ICD-10-CM

## 2017-05-28 LAB — COMPREHENSIVE METABOLIC PANEL
ALBUMIN: 3.2 g/dL — AB (ref 3.5–5.0)
ALK PHOS: 93 U/L (ref 38–126)
ALT: 24 U/L (ref 14–54)
ANION GAP: 12 (ref 5–15)
AST: 30 U/L (ref 15–41)
BILIRUBIN TOTAL: 0.4 mg/dL (ref 0.3–1.2)
BUN: 11 mg/dL (ref 6–20)
CALCIUM: 9 mg/dL (ref 8.9–10.3)
CO2: 27 mmol/L (ref 22–32)
Chloride: 104 mmol/L (ref 101–111)
Creatinine, Ser: 0.59 mg/dL (ref 0.44–1.00)
GFR calc Af Amer: >60 mL/min (ref >60)
GLUCOSE: 197 mg/dL — AB (ref 65–99)
Potassium: 4.1 mmol/L (ref 3.5–5.1)
Sodium: 143 mmol/L (ref 135–145)
TOTAL PROTEIN: 7.1 g/dL (ref 6.5–8.1)

## 2017-05-28 LAB — GLUCOSE, CAPILLARY
GLUCOSE-CAPILLARY: 179 mg/dL — AB (ref 65–99)
GLUCOSE-CAPILLARY: 175 mg/dL — AB (ref 65–99)
GLUCOSE-CAPILLARY: 275 mg/dL — AB (ref 65–99)
GLUCOSE-CAPILLARY: 271 mg/dL — AB (ref 65–99)

## 2017-05-28 LAB — CBC
HCT: 40.2 % (ref 36.0–46.0)
Hemoglobin: 12.8 g/dL (ref 12.0–15.0)
MCH: 28.6 pg (ref 26.0–34.0)
MCHC: 31.8 g/dL (ref 30.0–36.0)
MCV: 89.7 fL (ref 78.0–100.0)
Platelets: 202 10*3/uL (ref 150–400)
RBC: 4.48 MIL/uL (ref 3.87–5.11)
RDW: 14 % (ref 11.5–15.5)
WBC: 13.1 10*3/uL — AB (ref 4.0–10.5)

## 2017-05-28 LAB — MAGNESIUM: Magnesium: 2.1 mg/dL (ref 1.7–2.4)

## 2017-05-28 MED ORDER — FUROSEMIDE 40 MG PO TABS: 40.0000 mg | ORAL_TABLET | Freq: Every day | ORAL | Status: DC

## 2017-05-28 MED ORDER — POTASSIUM CHLORIDE CRYS ER 20 MEQ PO TBCR
50.0000 meq | EXTENDED_RELEASE_TABLET | Freq: Once | ORAL | Status: AC
  Administered 2017-05-28: 50 meq via ORAL
  Filled 2017-05-28: qty 1

## 2017-05-28 MED ORDER — FUROSEMIDE 10 MG/ML IJ SOLN
60.0000 mg | Freq: Once | INTRAMUSCULAR | Status: AC
  Administered 2017-05-28: 60 mg via INTRAVENOUS
  Filled 2017-05-28: qty 6

## 2017-05-28 MED ORDER — POTASSIUM CHLORIDE CRYS ER 20 MEQ PO TBCR: 30.0000 meq | EXTENDED_RELEASE_TABLET | Freq: Every day | ORAL | Status: DC

## 2017-05-28 MED ORDER — METHYLPREDNISOLONE SODIUM SUCC 125 MG IJ SOLR
60.0000 mg | Freq: Four times a day (QID) | INTRAMUSCULAR | Status: AC
  Administered 2017-05-28 – 2017-05-31 (×11): 60 mg via INTRAVENOUS
  Filled 2017-05-28 (×11): qty 2

## 2017-05-28 MED ORDER — INSULIN GLARGINE 100 UNIT/ML ~~LOC~~ SOLN
20.0000 [IU] | Freq: Every day | SUBCUTANEOUS | Status: DC
  Administered 2017-05-28: 20 [IU] via SUBCUTANEOUS
  Filled 2017-05-28 (×2): qty 0.2

## 2017-05-28 MED ORDER — INSULIN ASPART 100 UNIT/ML ~~LOC~~ SOLN
8.0000 [IU] | Freq: Three times a day (TID) | SUBCUTANEOUS | Status: DC
  Administered 2017-05-28 (×3): 8 [IU] via SUBCUTANEOUS

## 2017-05-28 MED ORDER — AZITHROMYCIN 250 MG PO TABS
500.0000 mg | ORAL_TABLET | Freq: Every day | ORAL | Status: AC
  Administered 2017-05-28 – 2017-05-31 (×4): 500 mg via ORAL
  Filled 2017-05-28 (×4): qty 2

## 2017-05-28 MED ORDER — ORAL CARE MOUTH RINSE
15.0000 mL | Freq: Two times a day (BID) | OROMUCOSAL | Status: DC
  Administered 2017-05-29 – 2017-06-04 (×14): 15 mL via OROMUCOSAL

## 2017-05-28 NOTE — Progress Notes (Addendum)
PROGRESS NOTE    Latasha Chicklita N Karman  UEA:[id number]  DOB: 08/24/1975  DOA: 05/25/2017 PCP: Remus LofflerJones, Angel S, PA-C  Brief Admission Hx: Latasha [name] is a 42 y.o. female with medical history significant of ADHD, anxiety and depression, bipolar disorder, PTSD, osteoarthritis, asthma, chronic neck, back and hip pain, degenerative disc disease, fibromyalgia, history of headaches, hyperlipidemia, history of stroke, type 2 diabetes, history of pneumonia, COPD, tobacco use who is coming to the emergency department with complaints of dyspnea, associated with yellowish sputum producing cough (which occasionally makes her vomit), fatigue and malaise.   MDM/Assessment & Plan:   CAP (community acquired pneumonia) COPD with acute exacerbation  Continue care in stepdown unit.  Continue supplemental oxygen. BiPAP ventilation as needed. Continue bronchodilators. Solu-Medrol IV ordered.  Continue azithromycin 500 mg IVPB every 24 hours. Continue ceftriaxone 1 g IVPB every 24 hours. Consult to pulmonology as patient is difficult to get to improvement.    Acute Diastolic CHF - IV lasix 60 mg x 1 ordered, repeat at 5pm,, give potassium, monitor I/O, check CXR, repeat in AM, check echocardiogram.  Hypokalemia Replaced. Follow-up potassium level.  Hypomagnesemia. Replacing.  Hypertension Continue amlodipine 10 mg p.o. daily. Monitor blood pressure and heart rate.  Type 2 diabetes mellitus  Added basal / bolus / supplemental insulin and will follow and titrate as needed CBG (last 3)  Recent Labs    05/27/17 2113 05/28/17 0807 05/28/17 1207  GLUCAP 251* 179* 175*      Bipolar disorder Continue fluoxetine 40 mg p.o. daily. Continue Geodon 80 mg p.o. twice daily. Continue diazepam 5 mg p.o. every 8 hours as needed.     Hyperlipidemia Not on medical therapy.    Chronic pain Continue gabapentin. Continue Suboxone  DVT prophylaxis: Lovenox SQ. Code Status: Full code. Family  Communication:  Disposition Plan: Admit for IV antibiotic therapy and COPD exacerbation treatment.  Subjective: Pt says she feels as bad as she did when she was first admitted.   Objective: Vitals:   05/28/17 0700 05/28/17 0814 05/28/17 0847 05/28/17 1300  BP: 109/70     Pulse: 90     Resp: (!) 23     Temp:   98.9 F (37.2 C)   TempSrc:      SpO2: 97% 92%  92%  Weight:      Height:        Intake/Output Summary (Last 24 hours) at 05/28/2017 1510 Last data filed at 05/28/2017 1200 Gross per 24 hour  Intake 3850 ml  Output -  Net 3850 ml   Filed Weights   05/25/17 2040 05/27/17 0500  Weight: 104.3 kg (230 lb) 103.7 kg (228 lb 9.9 oz)   REVIEW OF SYSTEMS  As per history otherwise all reviewed and reported negative  Exam:  General exam: awake, alert, NAD, on bipap.  Respiratory system: diffuse wheezing  Cardiovascular system: S1 & S2 heard, RRR. No JVD, murmurs, gallops, clicks or pedal edema. Gastrointestinal system: Abdomen is nondistended, soft and nontender. Normal bowel sounds heard. Central nervous system: Alert and oriented. No focal neurological deficits. Extremities: no CCE.  Data Reviewed: Basic Metabolic Panel: Recent Labs  Lab 05/25/17 2230 05/27/17 1304 05/28/17 0457  NA 133* 137 143  K 3.0* 4.0 4.1  CL 96* 100* 104  CO2 22 23 27   GLUCOSE 210* 262* 197*  BUN 5* 11 11  CREATININE 0.70 0.69 0.59  CALCIUM 8.8* 9.1 9.0  MG 1.3* 2.1 2.1  PHOS 2.9  --   --  Liver Function Tests: Recent Labs  Lab 05/27/17 1304 05/28/17 0457  AST 41 30  ALT 30 24  ALKPHOS 100 93  BILITOT 0.6 0.4  PROT 7.4 7.1  ALBUMIN 3.4* 3.2*   No results for input(s): LIPASE, AMYLASE in the last 168 hours. No results for input(s): AMMONIA in the last 168 hours. CBC: Recent Labs  Lab 05/25/17 2230 05/26/17 0653 05/28/17 0457  WBC 17.5* 14.5* 13.1*  NEUTROABS 15.1* 13.0*  --   HGB 14.0 14.5 12.8  HCT 41.5 42.5 40.2  MCV 85.2 86.6 89.7  PLT 198 180 202   Cardiac  Enzymes: No results for input(s): CKTOTAL, CKMB, CKMBINDEX, TROPONINI in the last 168 hours. CBG (last 3)  Recent Labs    05/27/17 2113 05/28/17 0807 05/28/17 1207  GLUCAP 251* 179* 175*   Recent Results (from the past 240 hour(s))  MRSA PCR Screening     Status: None   Collection Time: 05/26/17  2:31 PM  Result Value Ref Range Status   MRSA by PCR NEGATIVE NEGATIVE Final    Comment:        The GeneXpert MRSA Assay (FDA approved for NASAL specimens only), is one component of a comprehensive MRSA colonization surveillance program. It is not intended to diagnose MRSA infection nor to guide or monitor treatment for MRSA infections. Performed at [facility], [address] [place], [place] [address]     Studies: Dg Chest [facility] 1 View  Result Date: 05/28/2017 CLINICAL DATA:  COPD, asthma EXAM: PORTABLE CHEST 1 VIEW COMPARISON:  05/25/2017 FINDINGS: Diffuse bilateral airspace disease, worsening since prior study. Heart is borderline in size. No visible effusions or acute bony abnormality. IMPRESSION: Worsening diffuse bilateral edema or infection. Electronically Signed   By: Charlett Nose M.D.   On: 05/28/2017 08:53   Scheduled Meds: . amLODipine  10 mg Oral Daily  . azithromycin  500 mg Oral QHS  . buprenorphine  8 mg Sublingual TID  . enoxaparin (LOVENOX) injection  40 mg Subcutaneous Q24H  . FLUoxetine  40 mg Oral Daily  . [START ON 05/29/2017] furosemide  40 mg Oral Daily  . gabapentin  800 mg Oral QID  . insulin aspart  0-20 Units Subcutaneous TID WC  . insulin aspart  0-5 Units Subcutaneous QHS  . insulin aspart  8 Units Subcutaneous TID WC  . insulin glargine  20 Units Subcutaneous Daily  . ipratropium-albuterol  3 mL Nebulization Q4H  . methylPREDNISolone (SOLU-MEDROL) injection  60 mg Intravenous Q6H  . mirabegron ER  50 mg Oral Daily  . [START ON 05/29/2017] potassium chloride  30 mEq Oral Daily  . ziprasidone  80 mg Oral BID WC   Continuous Infusions: .  cefTRIAXone (ROCEPHIN)  IV Stopped (05/28/17 0113)    Principal Problem:   CAP (community acquired pneumonia) Active Problems:   Hypokalemia   Depression   COPD with acute exacerbation (HCC)   Bipolar disorder (HCC)   Hyperlipidemia   Hypomagnesemia   BiPAP (biphasic positive airway pressure) dependence   Type 2 diabetes mellitus (HCC)  Critical Care Time spent: 31 mins  Standley Dakins, MD, FAAFP [facility] Pager [phone]  If 7PM-7AM, please contact night-coverage [email or web address] Password [email or web address] 05/28/2017, 3:10 PM    LOS: 3 days

## 2017-05-28 NOTE — Progress Notes (Signed)
PHARMACIST - PHYSICIAN COMMUNICATION DR:   Laural BenesJohnson CONCERNING: Antibiotic IV to Oral Route Change Policy  RECOMMENDATION: This patient is receiving azithromycin by the intravenous route.  Based on criteria approved by the Pharmacy and Therapeutics Committee, the antibiotic(s) is/are being converted to the equivalent oral dose form(s).   DESCRIPTION: These criteria include:  Patient being treated for a respiratory tract infection, urinary tract infection, cellulitis or clostridium difficile associated diarrhea if on metronidazole  The patient is not neutropenic and does not exhibit a GI malabsorption state  The patient is eating (either orally or via tube) and/or has been taking other orally administered medications for a least 24 hours  The patient is improving clinically and has a Tmax < 100.5  If you have questions about this conversion, please contact the Pharmacy Department  [x]   [phone]( [phone] )  [facility] []   [phone]( [phone] )  [facility] []   [phone]( [phone] )  [facility] []   [phone]( [phone] )  [facility] []   [phone]( [phone] )  [facility]   Thanks, Elder CyphersLorie Jamiyah Dingley, [place] Loura BackPharm D, [place] Clinical Pharmacist Pager [phone]

## 2017-05-29 ENCOUNTER — Inpatient Hospital Stay (HOSPITAL_COMMUNITY): Payer: Medicare Other

## 2017-05-29 DIAGNOSIS — I5031 Acute diastolic (congestive) heart failure: Secondary | ICD-10-CM

## 2017-05-29 LAB — COMPREHENSIVE METABOLIC PANEL
ALT: 23 U/L (ref 14–54)
AST: 20 U/L (ref 15–41)
Albumin: 3.3 g/dL — ABNORMAL LOW (ref 3.5–5.0)
Alkaline Phosphatase: 106 U/L (ref 38–126)
Anion gap: 16 — ABNORMAL HIGH (ref 5–15)
BUN: 19 mg/dL (ref 6–20)
CALCIUM: 9.3 mg/dL (ref 8.9–10.3)
CHLORIDE: 93 mmol/L — AB (ref 101–111)
CO2: 28 mmol/L (ref 22–32)
CREATININE: 0.63 mg/dL (ref 0.44–1.00)
GFR calc non Af Amer: >60 mL/min (ref >60)
Glucose, Bld: 307 mg/dL — ABNORMAL HIGH (ref 65–99)
Potassium: 3.6 mmol/L (ref 3.5–5.1)
Sodium: 137 mmol/L (ref 135–145)
Total Bilirubin: 0.6 mg/dL (ref 0.3–1.2)
Total Protein: 7.5 g/dL (ref 6.5–8.1)

## 2017-05-29 LAB — CBC WITH DIFFERENTIAL/PLATELET
Basophils Absolute: 0 10*3/uL (ref 0.0–0.1)
Basophils Relative: 0 %
EOS ABS: 0 10*3/uL (ref 0.0–0.7)
EOS PCT: 0 %
HCT: 41 % (ref 36.0–46.0)
Hemoglobin: 13.3 g/dL (ref 12.0–15.0)
LYMPHS ABS: 1 10*3/uL (ref 0.7–4.0)
LYMPHS PCT: 11 %
MCH: 28.6 pg (ref 26.0–34.0)
MCHC: 32.4 g/dL (ref 30.0–36.0)
MCV: 88.2 fL (ref 78.0–100.0)
MONOS PCT: 4 %
Monocytes Absolute: 0.3 10*3/uL (ref 0.1–1.0)
Neutro Abs: 7.8 10*3/uL — ABNORMAL HIGH (ref 1.7–7.7)
Neutrophils Relative %: 85 %
PLATELETS: 208 10*3/uL (ref 150–400)
RBC: 4.65 MIL/uL (ref 3.87–5.11)
RDW: 13.5 % (ref 11.5–15.5)
WBC: 9.1 10*3/uL (ref 4.0–10.5)

## 2017-05-29 LAB — ECHOCARDIOGRAM COMPLETE
Height: 67 in
WEIGHTICAEL: 3470.92 [oz_av]

## 2017-05-29 LAB — GLUCOSE, CAPILLARY
GLUCOSE-CAPILLARY: 365 mg/dL — AB (ref 65–99)
GLUCOSE-CAPILLARY: 210 mg/dL — AB (ref 65–99)
Glucose-Capillary: 235 mg/dL — ABNORMAL HIGH (ref 65–99)
Glucose-Capillary: 255 mg/dL — ABNORMAL HIGH (ref 65–99)
Glucose-Capillary: 155 mg/dL — ABNORMAL HIGH (ref 65–99)

## 2017-05-29 LAB — MAGNESIUM: MAGNESIUM: 2 mg/dL (ref 1.7–2.4)

## 2017-05-29 MED ORDER — POTASSIUM CHLORIDE CRYS ER 20 MEQ PO TBCR
30.0000 meq | EXTENDED_RELEASE_TABLET | Freq: Two times a day (BID) | ORAL | Status: DC
  Administered 2017-05-29 – 2017-06-05 (×15): 30 meq via ORAL
  Filled 2017-05-29 (×15): qty 1

## 2017-05-29 MED ORDER — FUROSEMIDE 10 MG/ML IJ SOLN
40.0000 mg | Freq: Two times a day (BID) | INTRAMUSCULAR | Status: DC
  Administered 2017-05-29 – 2017-05-30 (×3): 40 mg via INTRAVENOUS
  Filled 2017-05-29 (×3): qty 4

## 2017-05-29 MED ORDER — GUAIFENESIN ER 600 MG PO TB12
1200.0000 mg | ORAL_TABLET | Freq: Two times a day (BID) | ORAL | Status: DC
Start: 2017-05-29 — End: 2017-06-05
  Administered 2017-05-29 – 2017-06-05 (×15): 1200 mg via ORAL
  Filled 2017-05-29 (×15): qty 2

## 2017-05-29 MED ORDER — INSULIN STARTER KIT- PEN NEEDLES (ENGLISH)
1.0000 | Freq: Once | Status: AC
  Administered 2017-05-29: 1
  Filled 2017-05-29: qty 1

## 2017-05-29 MED ORDER — INSULIN GLARGINE 100 UNIT/ML ~~LOC~~ SOLN
30.0000 [IU] | Freq: Every day | SUBCUTANEOUS | Status: DC
  Administered 2017-05-29: 30 [IU] via SUBCUTANEOUS
  Filled 2017-05-29 (×4): qty 0.3

## 2017-05-29 MED ORDER — INSULIN ASPART 100 UNIT/ML ~~LOC~~ SOLN
10.0000 [IU] | Freq: Three times a day (TID) | SUBCUTANEOUS | Status: DC
  Administered 2017-05-29 (×2): 10 [IU] via SUBCUTANEOUS

## 2017-05-29 MED ORDER — INSULIN ASPART 100 UNIT/ML ~~LOC~~ SOLN
20.0000 [IU] | Freq: Once | SUBCUTANEOUS | Status: AC
  Administered 2017-05-29: 20 [IU] via SUBCUTANEOUS

## 2017-05-29 MED ORDER — INSULIN ASPART 100 UNIT/ML ~~LOC~~ SOLN
14.0000 [IU] | Freq: Three times a day (TID) | SUBCUTANEOUS | Status: DC
  Administered 2017-05-29 – 2017-05-30 (×3): 14 [IU] via SUBCUTANEOUS

## 2017-05-29 MED ORDER — FUROSEMIDE 10 MG/ML IJ SOLN
60.0000 mg | Freq: Once | INTRAMUSCULAR | Status: AC
  Administered 2017-05-29: 60 mg via INTRAVENOUS
  Filled 2017-05-29: qty 6

## 2017-05-29 MED ORDER — LIVING WELL WITH DIABETES BOOK
Freq: Once | Status: AC
  Administered 2017-05-29: 17:00:00
  Filled 2017-05-29: qty 1

## 2017-05-29 MED ORDER — INSULIN GLARGINE 100 UNIT/ML ~~LOC~~ SOLN
45.0000 [IU] | Freq: Every day | SUBCUTANEOUS | Status: DC
  Administered 2017-05-30: 45 [IU] via SUBCUTANEOUS
  Filled 2017-05-29 (×3): qty 0.45

## 2017-05-29 NOTE — Progress Notes (Signed)
CBG at 0300- 365 MD paged novolog 20 units ordered

## 2017-05-29 NOTE — Progress Notes (Signed)
*  PRELIMINARY RESULTS* Echocardiogram 2D Echocardiogram has been performed.  Stacey DrainWhite, Allie Gerhold J 05/29/2017, 4:22 PM

## 2017-05-29 NOTE — Progress Notes (Signed)
Nurse called about pt spo2 dropping to 85-86% while sleeping on heated HFNC flow increased to 45%

## 2017-05-29 NOTE — Consult Note (Signed)
   [facility]   05/29/2017  Latasha [name] 11/01/1975 [id number]   Referral received by inpatient diabetes coordinator for [facility] Care Management services and post hospital discharge follow up related to a diagnosis of diabetes and COPD. Patient was evaluated for telephonic based chronic disease management services with [facility] care Management Program as a benefit of patient's [employer] Medicare. Called into patient's inpatient room to explain [facility]. Patient denied any issues with transportation to the doctor or medication affordability. Verbal consent obtained. Patient gave [phone] as the best number to reach her. Patient will receive post hospital discharge calls and receive follow up diabetes education. [facility] Care Management services does not interfere with or replace any services arranged by the inpatient care management team. Made inpatient RNCM aware  [facility] will be following for care management. For additional questions please contact:   [place] Stell RN, BSN [facility]  Hospital Liaison  [phone]) Business Mobile [phone]) Toll free office

## 2017-05-29 NOTE — Progress Notes (Signed)
Inpatient Diabetes Program Recommendations  AACE/ADA: New Consensus Statement on Inpatient Glycemic Control (2015)  Target Ranges:  Prepandial:   less than 140 mg/dL      Peak postprandial:   less than 180 mg/dL (1-2 hours)      Critically ill patients:  140 - 180 mg/dL  Results for Latasha [name], Latasha [name] (MRN [id number]) as of 05/29/2017 15:52  Ref. Range 05/28/2017 08:07 05/28/2017 12:07 05/28/2017 16:30 05/28/2017 21:34 05/29/2017 03:49 05/29/2017 07:31 05/29/2017 11:48  Glucose-Capillary Latest Ref Range: 65 - 99 mg/dL 179 (H) 175 (H) 275 (H) 271 (H) 365 (H) 235 (H) 255 (H)   Results for Latasha [name], Latasha [name] (MRN [id number]) as of 05/29/2017 15:52  Ref. Range 05/25/2017 22:30  Hemoglobin A1C Latest Ref Range: 4.8 - 5.6 % 9.9 (H)   Review of Glycemic Control  Diabetes history: DM2 Outpatient Diabetes medications: Metformin 500 mg BID, Glipizide 5 mg daily Current orders for Inpatient glycemic control: Lantus 30 units daily, Novolog 10 units TID with meals, Novolog 0-20 TID with meals, Novolog 0-5 units QHS; Solumedrol 60 mg Q6H  Inpatient Diabetes Program Recommendations: HgbA1C: A1C 9.9% on 05/25/2017 indicating an average glucose of 237 mg/dl over the past 2-3 months. Patient reports she has been consistently taking Metformin and Glipizide and glucose has been 250 mg/dl or higher. Patient will likely need to be discharged new to insulin. If so, patient prefers insulin pens.  Spoke with patient about diabetes and home regimen for diabetes control. Patient reports that she is followed by PCP for diabetes management and she reports that she was recently dx with DM2 in January. Patient has a family history of DM and she asked PCP to check A1C in January and it was 11% at that time. Patient states that she was started on Glipizide 5 mg daily and Metformin 500 mg BID and she reports that she has been taking DM medications consistently. Patient has been checking her glucose and she states it has not been under 230  mg/dl since she started taking the Glipizide and Metformin. Inquired about DM education and patient states that she was educated on DM and also with family history of DM she felt she had appropriate knowledge of DM. Inquired about knowledge on DM and patient was not able to answer a lot of the questions.  Discussed A1C results (9.9% on 05/25/2017) and explained that her current A1C indicates an average glucose of 237 mg/dl over the past 2-3 months. Discussed glucose and A1C goals. Discussed importance of checking CBGs and maintaining good CBG control to prevent long-term and short-term complications. Explained how hyperglycemia leads to damage within blood vessels which lead to the common complications seen with uncontrolled diabetes. Patient stated she had no idea DM could cause damage in the blood vessels and lead to other health complications. Stressed to the patient the importance of improving glycemic control to prevent further complications from uncontrolled diabetes. Discussed impact of nutrition, exercise, stress, sickness, and medications on diabetes control.Patient has not been following carb modified diet and drinking regular [place]. Patient stated that she thought it was okay to eat whatever since she was taking medication for DM.   Discussed carbohydrates, carbohydrate goals per day and meal, along with portion sizes. Informed patient RD consult would be ordered for further diet education and Living Well with DM book would be ordered and asked patient to read entire book.  Inquired about using insulin outpatient and patient stated that she never wanted to go on insulin and that is  one of the reasons she had not followed back up with her PCP since starting on the Glipizide and Metformin. However, now that she has a better understanding of why DM control is so important she feels that she would benefit from taking insulin outpatient. Discussed Lantus and Novolog insulin as ordered and explained that she  is currently on steroids which are contributing to hyperglycemia and higher dosages of insulin. Discussed insulin injections with vial/syringe verus insulin pens and demonstrated both. Patient states that she would prefer insulin pens. Educated patient on insulin pen use at home. Reviewed all steps of insulin pen including attachment of needle, 2-unit air shot, dialing up dose, giving injection, removing needle, disposal of sharps, storage of unused insulin, disposal of insulin etc. Patient able to provide successful return demonstration. Informed patient an insulin pen starter kit would also be ordered for her to review. Informed patient that nursing will be asked to allow patient to self inject insulin in case she is discharged new to insulin. Discussed hypoglycemia along with proper treatment and encouraged patient to get glucose tablets or gel to always have on hand in case of hypoglycemia. Patient verbalized understanding of information discussed and she states that she has no further questions at this time related to diabetes. Ordered patient education by [employer] RNs, patient education DM videos, insulin pen starter kit, Living Well with DM book, RD consult, and [facility] CM consult for outpatient follow up.  IF MD discharges patient on insulin, patient prefers insulin pens and would need Rx for insulin pen(s) and insulin pen needles.   Thanks, Barnie Alderman, RN, MSN, CDE Diabetes Coordinator Inpatient Diabetes Program [phone] (Team Pager)

## 2017-05-29 NOTE — Care Management Note (Signed)
Case Management Note  Patient Details  Name: Latasha [name] MRN: [id number] Date of Birth: 04/20/1975  Subjective/Objective: Adm with CAP. From home, ind with ADL. Has cane and oxygen at home pta. O2 is with [facility]. She reports she had not been using it recently but kept it home if needed. She has PCP, transportation and insurance with prescription coverage. She is [facility] eligible, we discussed and she is agreeable to emmi calls at DC.                    Action/Plan: DC home with self care. Will follow for needs. [facility]  Expected Discharge Date:    unk              Expected Discharge Plan:  Home/Self Care  In-House Referral:     Discharge planning Services  CM Consult  Post Acute Care Choice:  NA Choice offered to:  NA  DME Arranged:    DME Agency:     HH Arranged:    HH Agency:     Status of Service:  Completed, signed off  If discussed at [employer] Length of Stay Meetings, dates discussed:    Additional Comments:  Sundeep Destin, Chrystine OilerSharley Diane, RN 05/29/2017, 12:06 PM

## 2017-05-29 NOTE — Consult Note (Signed)
Consult requested by: [facility], Dr. Wynetta Emery Consult requested for: Acute on chronic respiratory failure  HPI: This is a 42 year-old who has significant history of ADHD, anxiety and depression, bipolar disease, posttraumatic stress disorder, arthritis of multiple joints, COPD, chronic neck back and hip pain, fibromyalgia, history of stroke, previous pneumonia, diabetes mellitus type 2, obesity.  She came to the hospital on the 25th with increasing shortness of breath coughing up of yellow sputum fatigue malaise.  She had been using inhalers at home but they did not help very much.  She had stopped smoking for about 6 months but had started back approximately 2 months ago.  When she was evaluated in the emergency department she had hypoxia leukocytosis hyponatremia, hypokalemia hypomagnesemia blood sugar of 210, chest x-ray with changes of COPD with superimposed edema or pneumonia.  I have reviewed that film personally.  She was placed on BiPAP and admitted to stepdown.  She is on nasal cannula now and she says she feels better.  She is still coughing up sputum.  She says she does not think that she needs a nicotine patch.  She is not having any chest pain.  She does get nauseated sometimes from her cough.  She has a headache.  She continues to have pain in her back and neck.  Her anxiety is doing okay.  She has not had any other symptoms.  Past Medical History:  Diagnosis Date  . ADHD (attention deficit hyperactivity disorder)   . Anxiety and depression   . Arthritis   . Asthma   . Bipolar disorder ([place])   . Chronic back pain   . Chronic hip pain   . Chronic neck pain   . COPD (chronic obstructive pulmonary disease) ([place])   . DDD (degenerative disc disease)   . Fibromyalgia   . Headache(784.0)   . High cholesterol   . Hypertension   . Pneumonia   . PTSD (post-traumatic stress disorder)   . Sciatica   . Seizure ([place])    scar tissue " on Brain". last seizure 11/2012  . Shortness of  breath   . Stroke ([place])   . Type 2 diabetes mellitus ([facility])      Family History  Problem Relation Age of Onset  . Diabetes Mother   . Heart Problems Father   . Hypertension Father   . Healthy Sister   . Psychiatric Illness Sister   . Heart attack Paternal Uncle   . Heart attack Paternal Grandmother   . Heart disease Paternal Grandmother   . Other Brother        MVA   . Diabetes Other   . Heart failure Other   . Healthy Brother   . Healthy Son   . Healthy Daughter   . Healthy Daughter      Social History   Socioeconomic History  . Marital status: Legally Separated    Spouse name: Not on file  . Number of children: 3  . Years of education: 11th  . Highest education level: Not on file  Occupational History    Employer: OTHER    Comment: disabled  Social Needs  . Financial resource strain: Not on file  . Food insecurity:    Worry: Not on file    Inability: Not on file  . Transportation needs:    Medical: Not on file    Non-medical: Not on file  Tobacco Use  . Smoking status: Current Every Day Smoker    Packs/day: 0.50  Years: 20.00    Pack years: 10.00    Types: E-cigarettes, Cigarettes    Start date: 03/04/1983    Last attempt to quit: 01/06/2013    Years since quitting: 4.3  . Smokeless tobacco: Never Used  . Tobacco comment: started smoking again 2 months ago  Substance and Sexual Activity  . Alcohol use: No    Alcohol/week: 0.0 oz    Comment: used to drink "years ago".   . Drug use: Yes    Frequency: 2.0 times per week    Types: Marijuana  . Sexual activity: Yes    Birth control/protection: Surgical  Lifestyle  . Physical activity:    Days per week: Not on file    Minutes per session: Not on file  . Stress: Not on file  Relationships  . Social connections:    Talks on phone: Not on file    Gets together: Not on file    Attends religious service: Not on file    Active member of club or organization: Not on file    Attends meetings of clubs or  organizations: Not on file    Relationship status: Not on file  Other Topics Concern  . Not on file  Social History Narrative   Patient lives at home with family.   Caffeine Use: 3 12oz can sodas daily     ROS: Except as mentioned 10 point review of systems is negative    Objective: Vital signs in last 24 hours: Temp:  [98.4 F (36.9 C)-98.9 F (37.2 C)] 98.5 F (36.9 C) (03/29 0400) Pulse Rate:  [78-102] 88 (03/29 0605) Resp:  [16-25] 19 (03/29 0605) BP: (98-119)/(52-74) 109/63 (03/29 0605) SpO2:  [86 %-96 %] 92 % (03/29 0605) FiO2 (%):  [100 %] 100 % (03/29 0435) Weight:  [98.4 kg (216 lb 14.9 oz)] 98.4 kg (216 lb 14.9 oz) (03/29 0500) Weight change:  Last BM Date: 05/24/17(normal per pt)  Intake/Output from previous day: 03/28 0701 - 03/29 0700 In: 1200 [P.O.:1100; IV Piggyback:100] Out: 1000 [Urine:1000]  PHYSICAL EXAM Constitutional: She is awake and alert and in no acute distress.  She is obese.  Eyes: Pupils react EOMI.  Ears nose mouth and throat: Her mucous membranes are moist and her throat is clear.  Hearing is grossly normal.  Cardiovascular: Her heart is regular with normal heart sounds.  Respiratory: Respiratory effort is increased and she has some rhonchi bilaterally with wheezing anteriorly bilaterally.  Gastrointestinal: Her abdomen is soft obese without masses.  Musculoskeletal: Normal muscle strength in the upper and lower extremities.  Neurological: No focal abnormalities.  Psychiatric: Normal mood and affect  Lab Results: Basic Metabolic Panel: Recent Labs    05/28/17 0457 05/29/17 0602  NA 143 137  K 4.1 3.6  CL 104 93*  CO2 27 28  GLUCOSE 197* 307*  BUN 11 19  CREATININE 0.59 0.63  CALCIUM 9.0 9.3  MG 2.1 2.0   Liver Function Tests: Recent Labs    05/28/17 0457 05/29/17 0602  AST 30 20  ALT 24 23  ALKPHOS 93 106  BILITOT 0.4 0.6  PROT 7.1 7.5  ALBUMIN 3.2* 3.3*   No results for input(s): LIPASE, AMYLASE in the last 72 hours. No  results for input(s): AMMONIA in the last 72 hours. CBC: Recent Labs    05/28/17 0457 05/29/17 0602  WBC 13.1* 9.1  NEUTROABS  --  7.8*  HGB 12.8 13.3  HCT 40.2 41.0  MCV 89.7 88.2  PLT 202 208  Cardiac Enzymes: No results for input(s): CKTOTAL, CKMB, CKMBINDEX, TROPONINI in the last 72 hours. BNP: No results for input(s): PROBNP in the last 72 hours. D-Dimer: No results for input(s): DDIMER in the last 72 hours. CBG: Recent Labs    05/28/17 0807 05/28/17 1207 05/28/17 1630 05/28/17 2134 05/29/17 0349 05/29/17 0731  GLUCAP 179* 175* 275* 271* 365* 235*   Hemoglobin A1C: No results for input(s): HGBA1C in the last 72 hours. Fasting Lipid Panel: No results for input(s): CHOL, HDL, LDLCALC, TRIG, CHOLHDL, LDLDIRECT in the last 72 hours. Thyroid Function Tests: No results for input(s): TSH, T4TOTAL, FREET4, T3FREE, THYROIDAB in the last 72 hours. Anemia Panel: No results for input(s): VITAMINB12, FOLATE, FERRITIN, TIBC, IRON, RETICCTPCT in the last 72 hours. Coagulation: No results for input(s): LABPROT, INR in the last 72 hours. Urine Drug Screen: Drugs of Abuse     Component Value Date/Time   LABOPIA NONE DETECTED 05/26/2017 0235   COCAINSCRNUR NONE DETECTED 05/26/2017 0235   COCAINSCRNUR NEGATIVE 10/28/2008 0957   LABBENZ POSITIVE (A) 05/26/2017 0235   LABBENZ (A) 10/28/2008 0957    POSITIVE (NOTE) Result repeated and verified. Sent for confirmatory testing   AMPHETMU NONE DETECTED 05/26/2017 0235   THCU POSITIVE (A) 05/26/2017 0235   LABBARB NONE DETECTED 05/26/2017 0235    Alcohol Level: No results for input(s): ETH in the last 72 hours. Urinalysis: No results for input(s): COLORURINE, LABSPEC, PHURINE, GLUCOSEU, HGBUR, BILIRUBINUR, KETONESUR, PROTEINUR, UROBILINOGEN, NITRITE, LEUKOCYTESUR in the last 72 hours.  Invalid input(s): APPERANCEUR Misc. Labs:   ABGS: No results for input(s): PHART, PO2ART, TCO2, HCO3 in the last 72 hours.  Invalid  input(s): PCO2   MICROBIOLOGY: Recent Results (from the past 240 hour(s))  MRSA PCR Screening     Status: None   Collection Time: 05/26/17  2:31 PM  Result Value Ref Range Status   MRSA by PCR NEGATIVE NEGATIVE Final    Comment:        The GeneXpert MRSA Assay (FDA approved for NASAL specimens only), is one component of a comprehensive MRSA colonization surveillance program. It is not intended to diagnose MRSA infection nor to guide or monitor treatment for MRSA infections. Performed at [facility], [address], [place], [place] [address]     Studies/Results: Dg Chest Port 1 View  Result Date: 05/29/2017 CLINICAL DATA:  Followup congestive heart failure and asthma. EXAM: PORTABLE CHEST 1 VIEW COMPARISON:  05/28/2017 FINDINGS: Today's film is under penetrated. Persistence and possible worsening of diffuse pulmonary density most consistent with edema. Lower lobe atelectasis may be worsening. Probable small amount of pleural fluid. IMPRESSION: Under penetrated film, but possible worsening of edema, atelectasis and effusions. Electronically Signed   By: Nelson Chimes M.D.   On: 05/29/2017 07:35   Dg Chest Port 1 View  Result Date: 05/28/2017 CLINICAL DATA:  COPD, asthma EXAM: PORTABLE CHEST 1 VIEW COMPARISON:  05/25/2017 FINDINGS: Diffuse bilateral airspace disease, worsening since prior study. Heart is borderline in size. No visible effusions or acute bony abnormality. IMPRESSION: Worsening diffuse bilateral edema or infection. Electronically Signed   By: Rolm Baptise M.D.   On: 05/28/2017 08:53    Medications:  Prior to Admission:  Facility-Administered Medications Prior to Admission  Medication Dose Route Frequency Provider Last Rate Last Dose  . levalbuterol (XOPENEX) nebulizer solution 0.63 mg  0.63 mg Nebulization Once Magdalen Spatz, NP       Medications Prior to Admission  Medication Sig Dispense Refill Last Dose  . albuterol (PROVENTIL  HFA;VENTOLIN HFA) 108 (90 Base)  MCG/ACT inhaler Inhale 2 puffs into the lungs every 6 (six) hours as needed for wheezing or shortness of breath. 1 Inhaler 5 05/25/2017 at Unknown time  . amLODipine (NORVASC) 10 MG tablet Take 1 tablet (10 mg total) by mouth daily. 90 tablet 3 05/25/2017 at Unknown time  . diazepam (VALIUM) 10 MG tablet Take 0.5 tablets (5 mg total) by mouth every 8 (eight) hours as needed for anxiety. 30 tablet 0 05/24/2017 at Unknown time  . FLUoxetine (PROZAC) 40 MG capsule Take 40 mg by mouth daily.    05/25/2017 at Unknown time  . Fluticasone-Umeclidin-Vilant (TRELEGY ELLIPTA) 100-62.5-25 MCG/INH AEPB Inhale 1 Inhaler into the lungs daily. written by Dr. Luan Pulling   05/25/2017 at Unknown time  . gabapentin (NEURONTIN) 800 MG tablet Take 1 tablet (800 mg total) 4 (four) times daily by mouth. 120 tablet 0 05/25/2017 at Unknown time  . glipiZIDE (GLUCOTROL) 5 MG tablet TAKE 1 TABLET DAILY BEFORE BREAKFAST. 30 tablet 0 05/25/2017 at Unknown time  . metFORMIN (GLUCOPHAGE) 500 MG tablet Take 1 tablet (500 mg total) by mouth 2 (two) times daily with a meal. 60 tablet 3 05/25/2017 at Unknown time  . mirabegron ER (MYRBETRIQ) 50 MG TB24 tablet Take 1 tablet (50 mg total) by mouth daily. 30 tablet 11 05/25/2017 at Unknown time  . OXYGEN Inhale 2 L into the lungs daily as needed.   unknown at Unknown time  . SUBOXONE 8-2 MG FILM Take 1 Film by mouth 3 (three) times daily.  0 05/25/2017 at Unknown time  . ziprasidone (GEODON) 80 MG capsule Take 80 mg by mouth 2 (two) times daily with a meal.    05/25/2017 at Unknown time  . Blood Glucose Monitoring Suppl (ONETOUCH VERIO) w/Device KIT 1 kit by Does not apply route 2 (two) times daily. 1 kit 0 Taking  . glucose blood (ONETOUCH VERIO) test strip Use as instructed 100 each 12 Taking  . Lancets (ONETOUCH ULTRASOFT) lancets Use as instructed 100 each 12 Taking   Scheduled: . amLODipine  10 mg Oral Daily  . azithromycin  500 mg Oral QHS  . buprenorphine  8 mg Sublingual TID  . enoxaparin  (LOVENOX) injection  40 mg Subcutaneous Q24H  . FLUoxetine  40 mg Oral Daily  . furosemide  40 mg Intravenous Q12H  . furosemide  60 mg Intravenous Once  . gabapentin  800 mg Oral QID  . guaiFENesin  1,200 mg Oral BID  . insulin aspart  0-20 Units Subcutaneous TID WC  . insulin aspart  0-5 Units Subcutaneous QHS  . insulin aspart  10 Units Subcutaneous TID WC  . insulin glargine  30 Units Subcutaneous Daily  . ipratropium-albuterol  3 mL Nebulization Q4H  . mouth rinse  15 mL Mouth Rinse BID  . methylPREDNISolone (SOLU-MEDROL) injection  60 mg Intravenous Q6H  . mirabegron ER  50 mg Oral Daily  . potassium chloride  30 mEq Oral BID  . ziprasidone  80 mg Oral BID WC   Continuous: . cefTRIAXone (ROCEPHIN)  IV Stopped (05/29/17 0045)   PJA:[id number], albuterol, diazepam, LORazepam  Assesment: She was admitted with community-acquired pneumonia.  I do think her clinical course is more consistent with pneumonia but she may have some superimposed volume overload.  She has been on BiPAP now on nasal cannula.  She has pretty severe COPD at baseline.  I think she has been checked for alpha-1 antitrypsin deficiency as an outpatient but I will check  on that to be sure.  Alpha-1 antitrypsin is an acute phase reactant so should not be checked while she is in the hospital.  I think she is slowly improving.  Chest x-ray today is poor quality. Principal Problem:   CAP (community acquired pneumonia) Active Problems:   Hypokalemia   Depression   COPD with acute exacerbation (HCC)   Bipolar disorder (HCC)   Hyperlipidemia   Hypomagnesemia   BiPAP (biphasic positive airway pressure) dependence   Type 2 diabetes mellitus ([place])    Plan: Continue steroids antibiotics Lasix inhaled bronchodilators.    LOS: 4 days   Petrita Blunck L 05/29/2017, 8:28 AM

## 2017-05-29 NOTE — Progress Notes (Signed)
Started patient on meta neb treatments in place of neb. Hopefully this will help with her flutter therapy. Will give this while awake.Should help with atelectasis

## 2017-05-29 NOTE — Care Management Important Message (Signed)
Important Message  Patient Details  Name: Latasha [name] MRN: [id number] Date of Birth: 11/29/1975   Medicare Important Message Given:  Yes    Renie OraHawkins, Jacory Kamel Smith 05/29/2017, 12:16 PM

## 2017-05-29 NOTE — Progress Notes (Signed)
PROGRESS NOTE   JALAINA SALYERS  WJX:[id number]  DOB: 24-Aug-1975  DOA: 05/25/2017 PCP: Remus Loffler, PA-C  Brief Admission Hx: EVANGELENE [name] is a 42 y.o. female with medical history significant of ADHD, anxiety and depression, bipolar disorder, PTSD, osteoarthritis, asthma, chronic neck, back and hip pain, degenerative disc disease, fibromyalgia, history of headaches, hyperlipidemia, history of stroke, type 2 diabetes, history of pneumonia, COPD, tobacco use who is coming to the emergency department with complaints of dyspnea, associated with yellowish sputum producing cough (which occasionally makes her vomit), fatigue and malaise.   MDM/Assessment & Plan:   CAP (community acquired pneumonia) COPD with acute exacerbation  Continue care in stepdown unit.  Continue supplemental oxygen. BiPAP ventilation as needed. Continue bronchodilators. Solu-Medrol IV ordered.  Continue azithromycin 500 mg every 24 hours. Continue ceftriaxone 1 g IVPB every 24 hours. Consult to pulmonology for management recommendations.   Pt was strongly advised to stop smoking.    Acute Diastolic CHF -  Continue lasix for diuresis, check echocardiogram.  Hypokalemia Replaced. Follow-up potassium level.  Hypomagnesemia. Replacing.  Hypertension Continue amlodipine 10 mg p.o. daily. Monitor blood pressure and heart rate.  Type 2 diabetes mellitus  Added basal / bolus / supplemental insulin and will follow and titrate as needed CBG (last 3)  Recent Labs    05/28/17 2134 05/29/17 0349 05/29/17 0731  GLUCAP 271* 365* 235*    Bipolar disorder Continue fluoxetine 40 mg p.o. daily. Continue Geodon 80 mg p.o. twice daily. Continue diazepam 5 mg p.o. every 8 hours as needed.   Hyperlipidemia Not on medical therapy.    Chronic pain Continue gabapentin. Continue Suboxone  DVT prophylaxis: Lovenox SQ. Code Status: Full code. Family Communication:  Disposition Plan: Admit for IV antibiotic  therapy and COPD exacerbation treatment.  Subjective: Pt felt much better after lasix was given.    Objective: Vitals:   05/29/17 0400 05/29/17 0435 05/29/17 0500 05/29/17 0605  BP: 119/63  (!) 115/52 109/63  Pulse: 98  85 88  Resp: (!) 21  20 19   Temp: 98.5 F (36.9 C)     TempSrc: Oral     SpO2: 91% 91% 90% 92%  Weight:   98.4 kg (216 lb 14.9 oz)   Height:        Intake/Output Summary (Last 24 hours) at 05/29/2017 0739 Last data filed at 05/29/2017 0500 Gross per 24 hour  Intake 1200 ml  Output 1000 ml  Net 200 ml   Filed Weights   05/25/17 2040 05/27/17 0500 05/29/17 0500  Weight: 104.3 kg (230 lb) 103.7 kg (228 lb 9.9 oz) 98.4 kg (216 lb 14.9 oz)   REVIEW OF SYSTEMS  As per history otherwise all reviewed and reported negative  Exam:  General exam: awake, alert, NAD, on bipap.  Respiratory system: diffuse wheezing, rales, crackles heard.  Cardiovascular system: S1 & S2 heard, RRR. No JVD, murmurs, gallops, clicks or pedal edema. Gastrointestinal system: Abdomen is nondistended, soft and nontender. Normal bowel sounds heard. Central nervous system: Alert and oriented. No focal neurological deficits. Extremities: no CCE.  Data Reviewed: Basic Metabolic Panel: Recent Labs  Lab 05/25/17 2230 05/27/17 1304 05/28/17 0457 05/29/17 0602  NA 133* 137 143 137  K 3.0* 4.0 4.1 3.6  CL 96* 100* 104 93*  CO2 22 23 27 28   GLUCOSE 210* 262* 197* 307*  BUN 5* 11 11 19   CREATININE 0.70 0.69 0.59 0.63  CALCIUM 8.8* 9.1 9.0 9.3  MG 1.3* 2.1 2.1 2.0  PHOS 2.9  --   --   --    Liver Function Tests: Recent Labs  Lab 05/27/17 1304 05/28/17 0457 05/29/17 0602  AST 41 30 20  ALT 30 24 23   ALKPHOS 100 93 106  BILITOT 0.6 0.4 0.6  PROT 7.4 7.1 7.5  ALBUMIN 3.4* 3.2* 3.3*   No results for input(s): LIPASE, AMYLASE in the last 168 hours. No results for input(s): AMMONIA in the last 168 hours. CBC: Recent Labs  Lab 05/25/17 2230 05/26/17 0653 05/28/17 0457  05/29/17 0602  WBC 17.5* 14.5* 13.1* 9.1  NEUTROABS 15.1* 13.0*  --  7.8*  HGB 14.0 14.5 12.8 13.3  HCT 41.5 42.5 40.2 41.0  MCV 85.2 86.6 89.7 88.2  PLT 198 180 202 208   Cardiac Enzymes: No results for input(s): CKTOTAL, CKMB, CKMBINDEX, TROPONINI in the last 168 hours. CBG (last 3)  Recent Labs    05/28/17 2134 05/29/17 0349 05/29/17 0731  GLUCAP 271* 365* 235*   Recent Results (from the past 240 hour(s))  MRSA PCR Screening     Status: None   Collection Time: 05/26/17  2:31 PM  Result Value Ref Range Status   MRSA by PCR NEGATIVE NEGATIVE Final    Comment:        The GeneXpert MRSA Assay (FDA approved for NASAL specimens only), is one component of a comprehensive MRSA colonization surveillance program. It is not intended to diagnose MRSA infection nor to guide or monitor treatment for MRSA infections. Performed at [facility], [address] [place], [place] [address]     [facility]  Result Date: 05/29/2017 CLINICAL DATA:  Followup congestive heart failure and asthma. EXAM: PORTABLE CHEST 1 VIEW COMPARISON:  05/28/2017 FINDINGS: Today's film is under penetrated. Persistence and possible worsening of diffuse pulmonary density most consistent with edema. Lower lobe atelectasis may be worsening. Probable small amount of pleural fluid. IMPRESSION: Under penetrated film, but possible worsening of edema, atelectasis and effusions. Electronically Signed   By: Paulina FusiMark  Shogry M.D.   On: 05/29/2017 07:35   Dg Chest Port 1 View  Result Date: 05/28/2017 CLINICAL DATA:  COPD, asthma EXAM: PORTABLE CHEST 1 VIEW COMPARISON:  05/25/2017 FINDINGS: Diffuse bilateral airspace disease, worsening since prior study. Heart is borderline in size. No visible effusions or acute bony abnormality. IMPRESSION: Worsening diffuse bilateral edema or infection. Electronically Signed   By: Charlett NoseKevin  Dover M.D.   On: 05/28/2017 08:53   Scheduled Meds: . amLODipine  10 mg Oral Daily  .  azithromycin  500 mg Oral QHS  . buprenorphine  8 mg Sublingual TID  . enoxaparin (LOVENOX) injection  40 mg Subcutaneous Q24H  . FLUoxetine  40 mg Oral Daily  . furosemide  40 mg Oral Daily  . gabapentin  800 mg Oral QID  . guaiFENesin  1,200 mg Oral BID  . insulin aspart  0-20 Units Subcutaneous TID WC  . insulin aspart  0-5 Units Subcutaneous QHS  . insulin aspart  8 Units Subcutaneous TID WC  . insulin glargine  20 Units Subcutaneous Daily  . ipratropium-albuterol  3 mL Nebulization Q4H  . mouth rinse  15 mL Mouth Rinse BID  . methylPREDNISolone (SOLU-MEDROL) injection  60 mg Intravenous Q6H  . mirabegron ER  50 mg Oral Daily  . potassium chloride  30 mEq Oral Daily  . ziprasidone  80 mg Oral BID WC   Continuous Infusions: . cefTRIAXone (ROCEPHIN)  IV Stopped (05/29/17 0045)    Principal  Problem:   CAP (community acquired pneumonia) Active Problems:   Hypokalemia   Depression   COPD with acute exacerbation (HCC)   Bipolar disorder (HCC)   Hyperlipidemia   Hypomagnesemia   BiPAP (biphasic positive airway pressure) dependence   Type 2 diabetes mellitus (HCC)  Critical Care Time spent: 34 mins  Standley Dakins, MD, FAAFP [facility] Pager [phone]  If 7PM-7AM, please contact night-coverage [email or web address] Password [facility] 05/29/2017, 7:39 AM    LOS: 4 days

## 2017-05-30 ENCOUNTER — Inpatient Hospital Stay (HOSPITAL_COMMUNITY): Payer: Medicare Other

## 2017-05-30 LAB — BLOOD GAS, ARTERIAL
Acid-Base Excess: 9.6 mmol/L — ABNORMAL HIGH (ref 0.0–2.0)
Bicarbonate: 32.6 mmol/L — ABNORMAL HIGH (ref 20.0–28.0)
Drawn by: 277331
FIO2: 0.9
O2 CONTENT: 30 L/min
O2 SAT: 98.1 %
PCO2 ART: 47.3 mmHg (ref 32.0–48.0)
PH ART: 7.467 — AB (ref 7.350–7.450)
Patient temperature: 37
pO2, Arterial: 110 mmHg — ABNORMAL HIGH (ref 83.0–108.0)

## 2017-05-30 LAB — BASIC METABOLIC PANEL
Anion gap: 19 — ABNORMAL HIGH (ref 5–15)
BUN: 24 mg/dL — AB (ref 6–20)
CHLORIDE: 94 mmol/L — AB (ref 101–111)
CO2: 28 mmol/L (ref 22–32)
Calcium: 9.3 mg/dL (ref 8.9–10.3)
Creatinine, Ser: 0.68 mg/dL (ref 0.44–1.00)
GFR calc Af Amer: >60 mL/min (ref >60)
GFR calc non Af Amer: >60 mL/min (ref >60)
GLUCOSE: 214 mg/dL — AB (ref 65–99)
Potassium: 4.1 mmol/L (ref 3.5–5.1)
Sodium: 141 mmol/L (ref 135–145)

## 2017-05-30 LAB — GLUCOSE, CAPILLARY
GLUCOSE-CAPILLARY: 223 mg/dL — AB (ref 65–99)
Glucose-Capillary: 218 mg/dL — ABNORMAL HIGH (ref 65–99)
Glucose-Capillary: 240 mg/dL — ABNORMAL HIGH (ref 65–99)
Glucose-Capillary: 171 mg/dL — ABNORMAL HIGH (ref 65–99)
Glucose-Capillary: 98 mg/dL (ref 65–99)

## 2017-05-30 LAB — MAGNESIUM: Magnesium: 2.1 mg/dL (ref 1.7–2.4)

## 2017-05-30 LAB — CBC
HCT: 41.9 % (ref 36.0–46.0)
Hemoglobin: 13.7 g/dL (ref 12.0–15.0)
MCH: 28.5 pg (ref 26.0–34.0)
MCHC: 32.7 g/dL (ref 30.0–36.0)
MCV: 87.3 fL (ref 78.0–100.0)
PLATELETS: 217 10*3/uL (ref 150–400)
RBC: 4.8 MIL/uL (ref 3.87–5.11)
RDW: 13.1 % (ref 11.5–15.5)
WBC: 13 10*3/uL — ABNORMAL HIGH (ref 4.0–10.5)

## 2017-05-30 MED ORDER — SENNOSIDES-DOCUSATE SODIUM 8.6-50 MG PO TABS
2.0000 | ORAL_TABLET | Freq: Every day | ORAL | Status: DC
  Administered 2017-05-30 – 2017-06-04 (×6): 2 via ORAL
  Filled 2017-05-30 (×6): qty 2

## 2017-05-30 MED ORDER — ACETYLCYSTEINE 20 % IN SOLN
3.0000 mL | RESPIRATORY_TRACT | Status: DC
  Administered 2017-05-30: 3 mL via RESPIRATORY_TRACT
  Administered 2017-05-30: 20:00:00 via RESPIRATORY_TRACT
  Administered 2017-05-30 – 2017-05-31 (×6): 3 mL via RESPIRATORY_TRACT
  Filled 2017-05-30 (×8): qty 4

## 2017-05-30 MED ORDER — FUROSEMIDE 10 MG/ML IJ SOLN
60.0000 mg | Freq: Two times a day (BID) | INTRAMUSCULAR | Status: AC
  Administered 2017-05-30 – 2017-05-31 (×2): 60 mg via INTRAVENOUS
  Filled 2017-05-30 (×2): qty 6

## 2017-05-30 MED ORDER — ACETYLCYSTEINE 20 % IN SOLN: 3.0000 mL | RESPIRATORY_TRACT | Status: DC

## 2017-05-30 MED ORDER — INSULIN ASPART 100 UNIT/ML ~~LOC~~ SOLN
12.0000 [IU] | Freq: Three times a day (TID) | SUBCUTANEOUS | Status: DC
  Administered 2017-05-30: 12 [IU] via SUBCUTANEOUS

## 2017-05-30 NOTE — Progress Notes (Signed)
Nutrition Brief Note  RD consulted for diabetes diet education.   A1C on 3/25 was 9.9. Diabetes appears to be a relatively new dx for patient, being formerly diagnosed with A1C of 11.4 on 03/13/17.  Diabetes Educator Note indicates patient has extremely poor insight to disease process including potential complications of diabetes and how to best manage it. She had been given DM book, education videos and referred for outpatient f/u.   RD operating remotely and not on site to give diabetes education. Per review of chart, this would be an inappropriate time for education anyway as she is "mentally and physically exhausted" from her respiratory distress and asking to be put on life support. On HFNC and BIPAP intermittently   Do not anticipate patient to discharge soon. Will attempt to see when more appropriate and patient more medically stable.     Latasha [name] RD, LDN, CNSC Clinical Nutrition Available Tues-Sat via Pager: [phone] 05/30/2017 1:45 PM

## 2017-05-30 NOTE — Progress Notes (Signed)
PROGRESS NOTE   Latasha [name]  ZOX:[id number]  DOB: 01/20/1976  DOA: 05/25/2017 PCP: Remus LofflerJones, Angel S, PA-C  Brief Admission Hx: Latasha [name] is a 42 y.o. female with medical history significant of ADHD, anxiety and depression, bipolar disorder, PTSD, osteoarthritis, asthma, chronic neck, back and hip pain, degenerative disc disease, fibromyalgia, history of headaches, hyperlipidemia, history of stroke, type 2 diabetes, history of pneumonia, COPD, tobacco use who is coming to the emergency department with complaints of dyspnea, associated with yellowish sputum producing cough (which occasionally makes her vomit), fatigue and malaise.   MDM/Assessment & Plan:   CAP (community acquired pneumonia) COPD with acute exacerbation  Continue care in stepdown unit.  Continue supplemental oxygen. BiPAP ventilation as needed. Continue bronchodilators. Solu-Medrol IV ordered.  Continue azithromycin 500 mg every 24 hours. Continue ceftriaxone 1 g IVPB every 24 hours. Consult to pulmonology for management recommendations.   Pt was strongly advised to stop smoking.   Pulmonary team added Mucomyst.  Consider chest PT.   Acute Diastolic CHF -  Continue lasix for diuresis, Echocardiogram below: Study Conclusions - Left ventricle: The cavity size was normal. Wall thickness was  normal. Systolic function was normal. The estimated ejection   fraction was in the range of 60% to 65%. Doppler parameters are  consistent with abnormal left ventricular relaxation (grade 1   diastolic dysfunction). - Aortic valve: Mildly calcified annulus. Trileaflet; normal thickness leaflets.   Hypokalemia Replaced. Follow BMP.   Hypomagnesemia. Replacing.  Hypertension Continue amlodipine 10 mg p.o. daily. Monitor blood pressure and heart rate.  Type 2 diabetes mellitus  Added basal / bolus / supplemental insulin and will follow and titrate as needed CBG (last 3)  Recent Labs    05/30/17 0319  05/30/17 0801 05/30/17 1121  GLUCAP 218* 240* 171*   Bipolar disorder Continue fluoxetine 40 mg p.o. daily. Continue Geodon 80 mg p.o. twice daily. Continue diazepam 5 mg p.o. every 8 hours as needed.   Hyperlipidemia Not on medical therapy.    Chronic pain Continue gabapentin. Continue Suboxone  DVT prophylaxis: Lovenox SQ. Code Status: Full code. Family Communication:  Disposition Plan: Admit for IV antibiotic therapy and COPD exacerbation treatment.  Subjective: Pt says she doesn't feel any better, still having difficulty breathing, asking about being placed on life support  Objective: Vitals:   05/30/17 0500 05/30/17 0802 05/30/17 0819 05/30/17 1122  BP: (!) 99/55     Pulse: 77 89  68  Resp: 17 15  (!) 23  Temp:  98.6 F (37 C)  98.4 F (36.9 C)  TempSrc:  Oral  Oral  SpO2: (!) 89% 95% 97% 93%  Weight:      Height:        Intake/Output Summary (Last 24 hours) at 05/30/2017 1346 Last data filed at 05/29/2017 2200 Gross per 24 hour  Intake 480 ml  Output 900 ml  Net -420 ml   Filed Weights   05/27/17 0500 05/29/17 0500 05/30/17 0400  Weight: 103.7 kg (228 lb 9.9 oz) 98.4 kg (216 lb 14.9 oz) 97.9 kg (215 lb 13.3 oz)   REVIEW OF SYSTEMS  As per history otherwise all reviewed and reported negative  Exam:  General exam: awake, alert, NAD, on bipap.  Respiratory system: diffuse wheezing, rales, crackles heard.  Cardiovascular system: S1 & S2 heard, RRR. No JVD, murmurs, gallops, clicks or pedal edema. Gastrointestinal system: Abdomen is nondistended, soft and nontender. Normal bowel sounds heard. Central nervous system: Alert and oriented. No focal neurological  deficits. Extremities: no CCE.  Data Reviewed: Basic Metabolic Panel: Recent Labs  Lab 05/25/17 2230 05/27/17 1304 05/28/17 0457 05/29/17 0602 05/30/17 0410  NA 133* 137 143 137 141  K 3.0* 4.0 4.1 3.6 4.1  CL 96* 100* 104 93* 94*  CO2 22 23 27 28 28   GLUCOSE 210* 262* 197* 307* 214*   BUN 5* 11 11 19  24*  CREATININE 0.70 0.69 0.59 0.63 0.68  CALCIUM 8.8* 9.1 9.0 9.3 9.3  MG 1.3* 2.1 2.1 2.0 2.1  PHOS 2.9  --   --   --   --    Liver Function Tests: Recent Labs  Lab 05/27/17 1304 05/28/17 0457 05/29/17 0602  AST 41 30 20  ALT 30 24 23   ALKPHOS 100 93 106  BILITOT 0.6 0.4 0.6  PROT 7.4 7.1 7.5  ALBUMIN 3.4* 3.2* 3.3*   No results for input(s): LIPASE, AMYLASE in the last 168 hours. No results for input(s): AMMONIA in the last 168 hours. CBC: Recent Labs  Lab 05/25/17 2230 05/26/17 0653 05/28/17 0457 05/29/17 0602 05/30/17 0410  WBC 17.5* 14.5* 13.1* 9.1 13.0*  NEUTROABS 15.1* 13.0*  --  7.8*  --   HGB 14.0 14.5 12.8 13.3 13.7  HCT 41.5 42.5 40.2 41.0 41.9  MCV 85.2 86.6 89.7 88.2 87.3  PLT 198 180 202 208 217   Cardiac Enzymes: No results for input(s): CKTOTAL, CKMB, CKMBINDEX, TROPONINI in the last 168 hours. CBG (last 3)  Recent Labs    05/30/17 0319 05/30/17 0801 05/30/17 1121  GLUCAP 218* 240* 171*   Recent Results (from the past 240 hour(s))  MRSA PCR Screening     Status: None   Collection Time: 05/26/17  2:31 PM  Result Value Ref Range Status   MRSA by PCR NEGATIVE NEGATIVE Final    Comment:        The GeneXpert MRSA Assay (FDA approved for NASAL specimens only), is one component of a comprehensive MRSA colonization surveillance program. It is not intended to diagnose MRSA infection nor to guide or monitor treatment for MRSA infections. Performed at [facility], [address] [place], [place] [address]     Studies: Dg Chest [facility] 1 View  Result Date: 05/30/2017 CLINICAL DATA:  CHF EXAM: PORTABLE CHEST 1 VIEW COMPARISON:  Yesterday FINDINGS: Diffuse interstitial coarsening with indistinct opacities at the bases symmetrically. Borderline improvement at the right base. Borderline heart size. No visible effusion or pneumothorax. IMPRESSION: Borderline improvement from yesterday at the right base. CHF and/or bilateral pneumonia.  Electronically Signed   By: Marnee Spring M.D.   On: 05/30/2017 08:21   Dg Chest Port 1 View  Result Date: 05/29/2017 CLINICAL DATA:  Followup congestive heart failure and asthma. EXAM: PORTABLE CHEST 1 VIEW COMPARISON:  05/28/2017 FINDINGS: Today's film is under penetrated. Persistence and possible worsening of diffuse pulmonary density most consistent with edema. Lower lobe atelectasis may be worsening. Probable small amount of pleural fluid. IMPRESSION: Under penetrated film, but possible worsening of edema, atelectasis and effusions. Electronically Signed   By: Paulina Fusi M.D.   On: 05/29/2017 07:35   Scheduled Meds: . acetylcysteine  3 mL Nebulization Q4H  . amLODipine  10 mg Oral Daily  . azithromycin  500 mg Oral QHS  . buprenorphine  8 mg Sublingual TID  . enoxaparin (LOVENOX) injection  40 mg Subcutaneous Q24H  . FLUoxetine  40 mg Oral Daily  . [START ON 05/31/2017] furosemide  60 mg Intravenous Q12H  . gabapentin  800 mg Oral QID  . guaiFENesin  1,200 mg Oral BID  . insulin aspart  0-20 Units Subcutaneous TID WC  . insulin aspart  0-5 Units Subcutaneous QHS  . insulin aspart  14 Units Subcutaneous TID WC  . insulin glargine  45 Units Subcutaneous Daily  . ipratropium-albuterol  3 mL Nebulization Q4H  . mouth rinse  15 mL Mouth Rinse BID  . methylPREDNISolone (SOLU-MEDROL) injection  60 mg Intravenous Q6H  . mirabegron ER  50 mg Oral Daily  . potassium chloride  30 mEq Oral BID  . ziprasidone  80 mg Oral BID WC   Continuous Infusions: . cefTRIAXone (ROCEPHIN)  IV Stopped (05/30/17 0000)    Principal Problem:   CAP (community acquired pneumonia) Active Problems:   Hypokalemia   Depression   COPD with acute exacerbation (HCC)   Bipolar disorder (HCC)   Hyperlipidemia   Hypomagnesemia   BiPAP (biphasic positive airway pressure) dependence   Type 2 diabetes mellitus (HCC)  Critical Care Time spent: 32 mins  Standley Dakins, MD, FAAFP [facility] Pager  [phone]  If 7PM-7AM, please contact night-coverage [email or web address] Password [email or web address] 05/30/2017, 1:46 PM    LOS: 5 days

## 2017-05-30 NOTE — Progress Notes (Signed)
Subjective: She says she is mentally and physically exhausted.  She says she wishes we would put her on life support so that she does not have to work so hard.  She is coughing.  She feels like she is not any better.  Objective: Vital signs in last 24 hours: Temp:  [98.2 F (36.8 C)-98.6 F (37 C)] 98.6 F (37 C) (03/30 0802) Pulse Rate:  [74-95] 89 (03/30 0802) Resp:  [14-27] 15 (03/30 0802) BP: (99-128)/(47-79) 99/55 (03/30 0500) SpO2:  [86 %-97 %] 97 % (03/30 0819) FiO2 (%):  [90 %-100 %] 90 % (03/30 0819) Weight:  [97.9 kg (215 lb 13.3 oz)] 97.9 kg (215 lb 13.3 oz) (03/30 0400) Weight change: -0.5 kg (-1 lb 1.6 oz) Last BM Date: 05/29/17  Intake/Output from previous day: 03/29 0701 - 03/30 0700 In: 480 [P.O.:480] Out: 2101 [Urine:2100; Stool:1]  PHYSICAL EXAM General appearance: alert, cooperative and Extremely anxious Resp: rhonchi bilaterally and wheezes bilaterally Cardio: regular rate and rhythm, S1, S2 normal, no murmur, click, rub or gallop GI: soft, non-tender; bowel sounds normal; no masses,  no organomegaly Extremities: extremities normal, atraumatic, no cyanosis or edema  Lab Results:  Results for orders placed or performed during the hospital encounter of 05/25/17 (from the past 48 hour(s))  Glucose, capillary     Status: Abnormal   Collection Time: 05/28/17 12:07 PM  Result Value Ref Range   Glucose-Capillary 175 (H) 65 - 99 mg/dL  Glucose, capillary     Status: Abnormal   Collection Time: 05/28/17  4:30 PM  Result Value Ref Range   Glucose-Capillary 275 (H) 65 - 99 mg/dL  Glucose, capillary     Status: Abnormal   Collection Time: 05/28/17  9:34 PM  Result Value Ref Range   Glucose-Capillary 271 (H) 65 - 99 mg/dL  Glucose, capillary     Status: Abnormal   Collection Time: 05/29/17  3:49 AM  Result Value Ref Range   Glucose-Capillary 365 (H) 65 - 99 mg/dL  CBC with Differential/Platelet     Status: Abnormal   Collection Time: 05/29/17  6:02 AM  Result  Value Ref Range   WBC 9.1 4.0 - 10.5 K/uL   RBC 4.65 3.87 - 5.11 MIL/uL   Hemoglobin 13.3 12.0 - 15.0 g/dL   HCT 41.0 36.0 - 46.0 %   MCV 88.2 78.0 - 100.0 fL   MCH 28.6 26.0 - 34.0 pg   MCHC 32.4 30.0 - 36.0 g/dL   RDW 13.5 11.5 - 15.5 %   Platelets 208 150 - 400 K/uL   Neutrophils Relative % 85 %   Neutro Abs 7.8 (H) 1.7 - 7.7 K/uL   Lymphocytes Relative 11 %   Lymphs Abs 1.0 0.7 - 4.0 K/uL   Monocytes Relative 4 %   Monocytes Absolute 0.3 0.1 - 1.0 K/uL   Eosinophils Relative 0 %   Eosinophils Absolute 0.0 0.0 - 0.7 K/uL   Basophils Relative 0 %   Basophils Absolute 0.0 0.0 - 0.1 K/uL    Comment: Performed at [facility], [address] [place], [place] [address]  Comprehensive metabolic panel     Status: Abnormal   Collection Time: 05/29/17  6:02 AM  Result Value Ref Range   Sodium 137 135 - 145 mmol/L   Potassium 3.6 3.5 - 5.1 mmol/L   Chloride 93 (L) 101 - 111 mmol/L   CO2 28 22 - 32 mmol/L   Glucose, Bld 307 (H) 65 - 99 mg/dL   BUN 19  6 - 20 mg/dL   Creatinine, Ser 0.63 0.44 - 1.00 mg/dL   Calcium 9.3 8.9 - 10.3 mg/dL   Total Protein 7.5 6.5 - 8.1 g/dL   Albumin 3.3 (L) 3.5 - 5.0 g/dL   AST 20 15 - 41 U/L   ALT 23 14 - 54 U/L   Alkaline Phosphatase 106 38 - 126 U/L   Total Bilirubin 0.6 0.3 - 1.2 mg/dL   GFR calc non Af Amer >60 >60 mL/min   GFR calc Af Amer >60 >60 mL/min    Comment: (NOTE) The eGFR has been calculated using the CKD EPI equation. This calculation has not been validated in all clinical situations. eGFR's persistently <60 mL/min signify possible Chronic Kidney Disease.    Anion gap 16 (H) 5 - 15    Comment: Performed at [facility], [address], [place], [place] [address]  Magnesium     Status: None   Collection Time: 05/29/17  6:02 AM  Result Value Ref Range   Magnesium 2.0 1.7 - 2.4 mg/dL    Comment: Performed at [facility], [address], [place], [place] [address]  Glucose, capillary     Status: Abnormal   Collection Time:  05/29/17  7:31 AM  Result Value Ref Range   Glucose-Capillary 235 (H) 65 - 99 mg/dL  Glucose, capillary     Status: Abnormal   Collection Time: 05/29/17 11:48 AM  Result Value Ref Range   Glucose-Capillary 255 (H) 65 - 99 mg/dL  Glucose, capillary     Status: Abnormal   Collection Time: 05/29/17  4:31 PM  Result Value Ref Range   Glucose-Capillary 155 (H) 65 - 99 mg/dL  Glucose, capillary     Status: Abnormal   Collection Time: 05/29/17  9:06 PM  Result Value Ref Range   Glucose-Capillary 210 (H) 65 - 99 mg/dL   Comment 1 Notify RN    Comment 2 Document in Chart   Glucose, capillary     Status: Abnormal   Collection Time: 05/30/17  3:19 AM  Result Value Ref Range   Glucose-Capillary 218 (H) 65 - 99 mg/dL   Comment 1 Notify RN    Comment 2 Document in Chart   Basic metabolic panel     Status: Abnormal   Collection Time: 05/30/17  4:10 AM  Result Value Ref Range   Sodium 141 135 - 145 mmol/L   Potassium 4.1 3.5 - 5.1 mmol/L   Chloride 94 (L) 101 - 111 mmol/L   CO2 28 22 - 32 mmol/L   Glucose, Bld 214 (H) 65 - 99 mg/dL   BUN 24 (H) 6 - 20 mg/dL   Creatinine, Ser 0.68 0.44 - 1.00 mg/dL   Calcium 9.3 8.9 - 10.3 mg/dL   GFR calc non Af Amer >60 >60 mL/min   GFR calc Af Amer >60 >60 mL/min    Comment: (NOTE) The eGFR has been calculated using the CKD EPI equation. This calculation has not been validated in all clinical situations. eGFR's persistently <60 mL/min signify possible Chronic Kidney Disease.    Anion gap 19 (H) 5 - 15    Comment: Performed at [facility], [address] [place], [place] [address]  CBC     Status: Abnormal   Collection Time: 05/30/17  4:10 AM  Result Value Ref Range   WBC 13.0 (H) 4.0 - 10.5 K/uL   RBC 4.80 3.87 - 5.11 MIL/uL   Hemoglobin 13.7 12.0 - 15.0 g/dL   HCT 41.9  36.0 - 46.0 %   MCV 87.3 78.0 - 100.0 fL   MCH 28.5 26.0 - 34.0 pg   MCHC 32.7 30.0 - 36.0 g/dL   RDW 13.1 11.5 - 15.5 %   Platelets 217 150 - 400 K/uL    Comment: Performed  at [facility], [address], [place], [place] [address]  Magnesium     Status: None   Collection Time: 05/30/17  4:10 AM  Result Value Ref Range   Magnesium 2.1 1.7 - 2.4 mg/dL    Comment: Performed at [facility], [address], [place], [place] [address]  Glucose, capillary     Status: Abnormal   Collection Time: 05/30/17  8:01 AM  Result Value Ref Range   Glucose-Capillary 240 (H) 65 - 99 mg/dL    ABGS No results for input(s): PHART, PO2ART, TCO2, HCO3 in the last 72 hours.  Invalid input(s): PCO2 CULTURES Recent Results (from the past 240 hour(s))  MRSA PCR Screening     Status: None   Collection Time: 05/26/17  2:31 PM  Result Value Ref Range Status   MRSA by PCR NEGATIVE NEGATIVE Final    Comment:        The GeneXpert MRSA Assay (FDA approved for NASAL specimens only), is one component of a comprehensive MRSA colonization surveillance program. It is not intended to diagnose MRSA infection nor to guide or monitor treatment for MRSA infections. Performed at [facility], [address] [place], [place] [address]    Studies/Results: Dg Chest Port 1 View  Result Date: 05/30/2017 CLINICAL DATA:  CHF EXAM: PORTABLE CHEST 1 VIEW COMPARISON:  Yesterday FINDINGS: Diffuse interstitial coarsening with indistinct opacities at the bases symmetrically. Borderline improvement at the right base. Borderline heart size. No visible effusion or pneumothorax. IMPRESSION: Borderline improvement from yesterday at the right base. CHF and/or bilateral pneumonia. Electronically Signed   By: Monte Fantasia M.D.   On: 05/30/2017 08:21   Dg Chest Port 1 View  Result Date: 05/29/2017 CLINICAL DATA:  Followup congestive heart failure and asthma. EXAM: PORTABLE CHEST 1 VIEW COMPARISON:  05/28/2017 FINDINGS: Today's film is under penetrated. Persistence and possible worsening of diffuse pulmonary density most consistent with edema. Lower lobe atelectasis may be worsening. Probable small amount of  pleural fluid. IMPRESSION: Under penetrated film, but possible worsening of edema, atelectasis and effusions. Electronically Signed   By: Nelson Chimes M.D.   On: 05/29/2017 07:35    Medications:  Prior to Admission:  Facility-Administered Medications Prior to Admission  Medication Dose Route Frequency Provider Last Rate Last Dose  . levalbuterol (XOPENEX) nebulizer solution 0.63 mg  0.63 mg Nebulization Once Magdalen Spatz, NP       Medications Prior to Admission  Medication Sig Dispense Refill Last Dose  . albuterol (PROVENTIL HFA;VENTOLIN HFA) 108 (90 Base) MCG/ACT inhaler Inhale 2 puffs into the lungs every 6 (six) hours as needed for wheezing or shortness of breath. 1 Inhaler 5 05/25/2017 at Unknown time  . amLODipine (NORVASC) 10 MG tablet Take 1 tablet (10 mg total) by mouth daily. 90 tablet 3 05/25/2017 at Unknown time  . diazepam (VALIUM) 10 MG tablet Take 0.5 tablets (5 mg total) by mouth every 8 (eight) hours as needed for anxiety. 30 tablet 0 05/24/2017 at Unknown time  . FLUoxetine (PROZAC) 40 MG capsule Take 40 mg by mouth daily.    05/25/2017 at Unknown time  . Fluticasone-Umeclidin-Vilant (TRELEGY ELLIPTA) 100-62.5-25 MCG/INH AEPB Inhale 1 Inhaler into the lungs daily. written  by Dr. Luan Pulling   05/25/2017 at Unknown time  . gabapentin (NEURONTIN) 800 MG tablet Take 1 tablet (800 mg total) 4 (four) times daily by mouth. 120 tablet 0 05/25/2017 at Unknown time  . glipiZIDE (GLUCOTROL) 5 MG tablet TAKE 1 TABLET DAILY BEFORE BREAKFAST. 30 tablet 0 05/25/2017 at Unknown time  . metFORMIN (GLUCOPHAGE) 500 MG tablet Take 1 tablet (500 mg total) by mouth 2 (two) times daily with a meal. 60 tablet 3 05/25/2017 at Unknown time  . mirabegron ER (MYRBETRIQ) 50 MG TB24 tablet Take 1 tablet (50 mg total) by mouth daily. 30 tablet 11 05/25/2017 at Unknown time  . OXYGEN Inhale 2 L into the lungs daily as needed.   unknown at Unknown time  . SUBOXONE 8-2 MG FILM Take 1 Film by mouth 3 (three) times daily.   0 05/25/2017 at Unknown time  . ziprasidone (GEODON) 80 MG capsule Take 80 mg by mouth 2 (two) times daily with a meal.    05/25/2017 at Unknown time  . Blood Glucose Monitoring Suppl (ONETOUCH VERIO) w/Device KIT 1 kit by Does not apply route 2 (two) times daily. 1 kit 0 Taking  . glucose blood (ONETOUCH VERIO) test strip Use as instructed 100 each 12 Taking  . Lancets (ONETOUCH ULTRASOFT) lancets Use as instructed 100 each 12 Taking   Scheduled: . acetylcysteine  3 mL Nebulization Q4H  . amLODipine  10 mg Oral Daily  . azithromycin  500 mg Oral QHS  . buprenorphine  8 mg Sublingual TID  . enoxaparin (LOVENOX) injection  40 mg Subcutaneous Q24H  . FLUoxetine  40 mg Oral Daily  . furosemide  40 mg Intravenous Q12H  . gabapentin  800 mg Oral QID  . guaiFENesin  1,200 mg Oral BID  . insulin aspart  0-20 Units Subcutaneous TID WC  . insulin aspart  0-5 Units Subcutaneous QHS  . insulin aspart  14 Units Subcutaneous TID WC  . insulin glargine  45 Units Subcutaneous Daily  . ipratropium-albuterol  3 mL Nebulization Q4H  . mouth rinse  15 mL Mouth Rinse BID  . methylPREDNISolone (SOLU-MEDROL) injection  60 mg Intravenous Q6H  . mirabegron ER  50 mg Oral Daily  . potassium chloride  30 mEq Oral BID  . ziprasidone  80 mg Oral BID WC   Continuous: . cefTRIAXone (ROCEPHIN)  IV Stopped (05/30/17 0000)   [id number], albuterol, diazepam, LORazepam  Assesment: She was admitted with community-acquired pneumonia.  She has severe COPD at baseline.  She says that she is physically and mentally exhausted.  She is requesting life support but I told her that it does not appear that she needs that at this point.  She is still having trouble coughing up sputum.  Chest x-ray that I personally reviewed shows she is a little clearer in the right base but still has diffuse infiltrates.  Echocardiogram consistent with diastolic dysfunction  She has been using BiPAP.  She has bipolar disease and she is  having extreme anxiety right now. Principal Problem:   CAP (community acquired pneumonia) Active Problems:   Hypokalemia   Depression   COPD with acute exacerbation (HCC)   Bipolar disorder (HCC)   Hyperlipidemia   Hypomagnesemia   BiPAP (biphasic positive airway pressure) dependence   Type 2 diabetes mellitus ([place])    Plan: I am going to get a blood gas to make sure that she is not starting to retain CO2 and that her pH is okay.  Add Mucomyst  LOS: 5 days   Latasha [name] L 05/30/2017, 9:31 AM

## 2017-05-31 LAB — GLUCOSE, CAPILLARY
GLUCOSE-CAPILLARY: 243 mg/dL — AB (ref 65–99)
GLUCOSE-CAPILLARY: 227 mg/dL — AB (ref 65–99)
GLUCOSE-CAPILLARY: 222 mg/dL — AB (ref 65–99)
GLUCOSE-CAPILLARY: 133 mg/dL — AB (ref 65–99)
Glucose-Capillary: 62 mg/dL — ABNORMAL LOW (ref 65–99)
Glucose-Capillary: 87 mg/dL (ref 65–99)

## 2017-05-31 LAB — BASIC METABOLIC PANEL
ANION GAP: 15 (ref 5–15)
BUN: 30 mg/dL — ABNORMAL HIGH (ref 6–20)
CHLORIDE: 92 mmol/L — AB (ref 101–111)
CO2: 30 mmol/L (ref 22–32)
Calcium: 9.5 mg/dL (ref 8.9–10.3)
Creatinine, Ser: 0.71 mg/dL (ref 0.44–1.00)
GFR calc non Af Amer: >60 mL/min (ref >60)
Glucose, Bld: 244 mg/dL — ABNORMAL HIGH (ref 65–99)
POTASSIUM: 4 mmol/L (ref 3.5–5.1)
Sodium: 137 mmol/L (ref 135–145)

## 2017-05-31 LAB — MAGNESIUM: Magnesium: 2.1 mg/dL (ref 1.7–2.4)

## 2017-05-31 MED ORDER — INSULIN ASPART 100 UNIT/ML ~~LOC~~ SOLN
16.0000 [IU] | Freq: Three times a day (TID) | SUBCUTANEOUS | Status: DC
  Administered 2017-05-31: 16 [IU] via SUBCUTANEOUS

## 2017-05-31 MED ORDER — INSULIN GLARGINE 100 UNIT/ML ~~LOC~~ SOLN
50.0000 [IU] | Freq: Every day | SUBCUTANEOUS | Status: DC
  Administered 2017-05-31 – 2017-06-05 (×6): 50 [IU] via SUBCUTANEOUS
  Filled 2017-05-31 (×9): qty 0.5

## 2017-05-31 MED ORDER — FUROSEMIDE 40 MG PO TABS
40.0000 mg | ORAL_TABLET | Freq: Every day | ORAL | Status: DC
  Administered 2017-06-01 – 2017-06-05 (×5): 40 mg via ORAL
  Filled 2017-05-31 (×5): qty 1

## 2017-05-31 MED ORDER — INSULIN ASPART 100 UNIT/ML ~~LOC~~ SOLN
14.0000 [IU] | Freq: Three times a day (TID) | SUBCUTANEOUS | Status: DC
  Administered 2017-06-02 – 2017-06-05 (×11): 14 [IU] via SUBCUTANEOUS

## 2017-05-31 NOTE — Progress Notes (Signed)
Subjective: She says she feels substantially better today.  She has been using the vibro percussion on the bed and using Mucomyst and she has been able to cough up much more sputum.  Much less anxious.  No new complaints  Objective: Vital signs in last 24 hours: Temp:  [97.5 F (36.4 C)-98.4 F (36.9 C)] 98.2 F (36.8 C) (03/31 0742) Pulse Rate:  [64-88] 80 (03/31 0742) Resp:  [11-23] 21 (03/31 0407) BP: (87-122)/(50-78) 91/53 (03/31 0600) SpO2:  [87 %-95 %] 94 % (03/31 0835) FiO2 (%):  [70 %-92 %] 90 % (03/31 0835) Weight:  [97.6 kg (215 lb 2.7 oz)] 97.6 kg (215 lb 2.7 oz) (03/31 0500) Weight change: -0.3 kg (-10.6 oz) Last BM Date: 05/29/17  Intake/Output from previous day: 03/30 0701 - 03/31 0700 In: 1640 [P.O.:1440; IV Piggyback:200] Out: 3050 [Urine:3050]  PHYSICAL EXAM General appearance: alert, cooperative and no distress Resp: rhonchi bilaterally Cardio: regular rate and rhythm, S1, S2 normal, no murmur, click, rub or gallop GI: soft, non-tender; bowel sounds normal; no masses,  no organomegaly Extremities: extremities normal, atraumatic, no cyanosis or edema  Lab Results:  Results for orders placed or performed during the hospital encounter of 05/25/17 (from the past 48 hour(s))  Glucose, capillary     Status: Abnormal   Collection Time: 05/29/17 11:48 AM  Result Value Ref Range   Glucose-Capillary 255 (H) 65 - 99 mg/dL  Glucose, capillary     Status: Abnormal   Collection Time: 05/29/17  4:31 PM  Result Value Ref Range   Glucose-Capillary 155 (H) 65 - 99 mg/dL  Glucose, capillary     Status: Abnormal   Collection Time: 05/29/17  9:06 PM  Result Value Ref Range   Glucose-Capillary 210 (H) 65 - 99 mg/dL   Comment 1 Notify RN    Comment 2 Document in Chart   Glucose, capillary     Status: Abnormal   Collection Time: 05/30/17  3:19 AM  Result Value Ref Range   Glucose-Capillary 218 (H) 65 - 99 mg/dL   Comment 1 Notify RN    Comment 2 Document in Chart    Basic metabolic panel     Status: Abnormal   Collection Time: 05/30/17  4:10 AM  Result Value Ref Range   Sodium 141 135 - 145 mmol/L   Potassium 4.1 3.5 - 5.1 mmol/L   Chloride 94 (L) 101 - 111 mmol/L   CO2 28 22 - 32 mmol/L   Glucose, Bld 214 (H) 65 - 99 mg/dL   BUN 24 (H) 6 - 20 mg/dL   Creatinine, Ser 0.68 0.44 - 1.00 mg/dL   Calcium 9.3 8.9 - 10.3 mg/dL   GFR calc non Af Amer >60 >60 mL/min   GFR calc Af Amer >60 >60 mL/min    Comment: (NOTE) The eGFR has been calculated using the CKD EPI equation. This calculation has not been validated in all clinical situations. eGFR's persistently <60 mL/min signify possible Chronic Kidney Disease.    Anion gap 19 (H) 5 - 15    Comment: Performed at  Hospital, [address] [place], [place] [address]  CBC     Status: Abnormal   Collection Time: 05/30/17  4:10 AM  Result Value Ref Range   WBC 13.0 (H) 4.0 - 10.5 K/uL   RBC 4.80 3.87 - 5.11 MIL/uL   Hemoglobin 13.7 12.0 - 15.0 g/dL   HCT 41.9 36.0 - 46.0 %   MCV 87.3 78.0 - 100.0 fL  MCH 28.5 26.0 - 34.0 pg   MCHC 32.7 30.0 - 36.0 g/dL   RDW 13.1 11.5 - 15.5 %   Platelets 217 150 - 400 K/uL    Comment: Performed at [facility], [address] [place], [place] [address]  Magnesium     Status: None   Collection Time: 05/30/17  4:10 AM  Result Value Ref Range   Magnesium 2.1 1.7 - 2.4 mg/dL    Comment: Performed at [facility], [address] [place], [place] [address]  Glucose, capillary     Status: Abnormal   Collection Time: 05/30/17  8:01 AM  Result Value Ref Range   Glucose-Capillary 240 (H) 65 - 99 mg/dL  Glucose, capillary     Status: Abnormal   Collection Time: 05/30/17 11:21 AM  Result Value Ref Range   Glucose-Capillary 171 (H) 65 - 99 mg/dL  Blood gas, arterial     Status: Abnormal   Collection Time: 05/30/17 11:55 AM  Result Value Ref Range   FIO2 0.90    O2 Content 30.0 L/min   Delivery systems HI FLOW NASAL CANNULA    pH, Arterial 7.467 (H) 7.350 - 7.450    pCO2 arterial 47.3 32.0 - 48.0 mmHg   pO2, Arterial 110 (H) 83.0 - 108.0 mmHg   Bicarbonate 32.6 (H) 20.0 - 28.0 mmol/L   Acid-Base Excess 9.6 (H) 0.0 - 2.0 mmol/L   O2 Saturation 98.1 %   Patient temperature 37.0    Collection site LEFT RADIAL    Drawn by [id number]    Sample type ARTERIAL DRAW    Allens test (pass/fail) PASS PASS    Comment: Performed at [facility], [address], [place], [place] [address]  Glucose, capillary     Status: None   Collection Time: 05/30/17  4:17 PM  Result Value Ref Range   Glucose-Capillary 98 65 - 99 mg/dL  Glucose, capillary     Status: Abnormal   Collection Time: 05/30/17  9:08 PM  Result Value Ref Range   Glucose-Capillary 223 (H) 65 - 99 mg/dL   Comment 1 Notify RN   Glucose, capillary     Status: Abnormal   Collection Time: 05/31/17  3:10 AM  Result Value Ref Range   Glucose-Capillary 243 (H) 65 - 99 mg/dL  Basic metabolic panel     Status: Abnormal   Collection Time: 05/31/17  4:10 AM  Result Value Ref Range   Sodium 137 135 - 145 mmol/L   Potassium 4.0 3.5 - 5.1 mmol/L   Chloride 92 (L) 101 - 111 mmol/L   CO2 30 22 - 32 mmol/L   Glucose, Bld 244 (H) 65 - 99 mg/dL   BUN 30 (H) 6 - 20 mg/dL   Creatinine, Ser 0.71 0.44 - 1.00 mg/dL   Calcium 9.5 8.9 - 10.3 mg/dL   GFR calc non Af Amer >60 >60 mL/min   GFR calc Af Amer >60 >60 mL/min    Comment: (NOTE) The eGFR has been calculated using the CKD EPI equation. This calculation has not been validated in all clinical situations. eGFR's persistently <60 mL/min signify possible Chronic Kidney Disease.    Anion gap 15 5 - 15    Comment: Performed at [facility], [address] [place], [place] [address]  Magnesium     Status: None   Collection Time: 05/31/17  4:10 AM  Result Value Ref Range   Magnesium 2.1 1.7 - 2.4 mg/dL    Comment: Performed at [facility],  [address] [place], [place] [address]  Glucose, capillary     Status: Abnormal   Collection Time: 05/31/17  7:41 AM   Result Value Ref Range   Glucose-Capillary 227 (H) 65 - 99 mg/dL    ABGS Recent Labs    05/30/17 1155  PHART 7.467*  PO2ART 110*  HCO3 32.6*   CULTURES Recent Results (from the past 240 hour(s))  MRSA PCR Screening     Status: None   Collection Time: 05/26/17  2:31 PM  Result Value Ref Range Status   MRSA by PCR NEGATIVE NEGATIVE Final    Comment:        The GeneXpert MRSA Assay (FDA approved for NASAL specimens only), is one component of a comprehensive MRSA colonization surveillance program. It is not intended to diagnose MRSA infection nor to guide or monitor treatment for MRSA infections. Performed at [facility], [address] [place], [place] [address]    Studies/Results: Dg Chest Port 1 View  Result Date: 05/30/2017 CLINICAL DATA:  CHF EXAM: PORTABLE CHEST 1 VIEW COMPARISON:  Yesterday FINDINGS: Diffuse interstitial coarsening with indistinct opacities at the bases symmetrically. Borderline improvement at the right base. Borderline heart size. No visible effusion or pneumothorax. IMPRESSION: Borderline improvement from yesterday at the right base. CHF and/or bilateral pneumonia. Electronically Signed   By: Monte Fantasia M.D.   On: 05/30/2017 08:21    Medications:  Prior to Admission:  Facility-Administered Medications Prior to Admission  Medication Dose Route Frequency Provider Last Rate Last Dose  . levalbuterol (XOPENEX) nebulizer solution 0.63 mg  0.63 mg Nebulization Once Magdalen Spatz, NP       Medications Prior to Admission  Medication Sig Dispense Refill Last Dose  . albuterol (PROVENTIL HFA;VENTOLIN HFA) 108 (90 Base) MCG/ACT inhaler Inhale 2 puffs into the lungs every 6 (six) hours as needed for wheezing or shortness of breath. 1 Inhaler 5 05/25/2017 at Unknown time  . amLODipine (NORVASC) 10 MG tablet Take 1 tablet (10 mg total) by mouth daily. 90 tablet 3 05/25/2017 at Unknown time  . diazepam (VALIUM) 10 MG tablet Take 0.5 tablets (5 mg total) by  mouth every 8 (eight) hours as needed for anxiety. 30 tablet 0 05/24/2017 at Unknown time  . FLUoxetine (PROZAC) 40 MG capsule Take 40 mg by mouth daily.    05/25/2017 at Unknown time  . Fluticasone-Umeclidin-Vilant (TRELEGY ELLIPTA) 100-62.5-25 MCG/INH AEPB Inhale 1 Inhaler into the lungs daily. written by Dr. Luan Pulling   05/25/2017 at Unknown time  . gabapentin (NEURONTIN) 800 MG tablet Take 1 tablet (800 mg total) 4 (four) times daily by mouth. 120 tablet 0 05/25/2017 at Unknown time  . glipiZIDE (GLUCOTROL) 5 MG tablet TAKE 1 TABLET DAILY BEFORE BREAKFAST. 30 tablet 0 05/25/2017 at Unknown time  . metFORMIN (GLUCOPHAGE) 500 MG tablet Take 1 tablet (500 mg total) by mouth 2 (two) times daily with a meal. 60 tablet 3 05/25/2017 at Unknown time  . mirabegron ER (MYRBETRIQ) 50 MG TB24 tablet Take 1 tablet (50 mg total) by mouth daily. 30 tablet 11 05/25/2017 at Unknown time  . OXYGEN Inhale 2 L into the lungs daily as needed.   unknown at Unknown time  . SUBOXONE 8-2 MG FILM Take 1 Film by mouth 3 (three) times daily.  0 05/25/2017 at Unknown time  . ziprasidone (GEODON) 80 MG capsule Take 80 mg by mouth 2 (two) times daily with a meal.    05/25/2017 at Unknown time  . Blood Glucose Monitoring Suppl (  ONETOUCH VERIO) w/Device KIT 1 kit by Does not apply route 2 (two) times daily. 1 kit 0 Taking  . glucose blood (ONETOUCH VERIO) test strip Use as instructed 100 each 12 Taking  . Lancets (ONETOUCH ULTRASOFT) lancets Use as instructed 100 each 12 Taking   Scheduled: . acetylcysteine  3 mL Nebulization Q4H  . amLODipine  10 mg Oral Daily  . azithromycin  500 mg Oral QHS  . buprenorphine  8 mg Sublingual TID  . enoxaparin (LOVENOX) injection  40 mg Subcutaneous Q24H  . FLUoxetine  40 mg Oral Daily  . furosemide  60 mg Intravenous Q12H  . [START ON 06/01/2017] furosemide  40 mg Oral Daily  . gabapentin  800 mg Oral QID  . guaiFENesin  1,200 mg Oral BID  . insulin aspart  0-20 Units Subcutaneous TID WC  .  insulin aspart  0-5 Units Subcutaneous QHS  . insulin aspart  16 Units Subcutaneous TID WC  . insulin glargine  50 Units Subcutaneous Daily  . ipratropium-albuterol  3 mL Nebulization Q4H  . mouth rinse  15 mL Mouth Rinse BID  . mirabegron ER  50 mg Oral Daily  . potassium chloride  30 mEq Oral BID  . senna-docusate  2 tablet Oral QHS  . ziprasidone  80 mg Oral BID WC   Continuous: . cefTRIAXone (ROCEPHIN)  IV Stopped (05/31/17 0046)   [id number], albuterol, diazepam, LORazepam  Assesment: She was admitted with community-acquired pneumonia COPD exacerbation and acute on chronic hypoxic and hypercapnic respiratory failure.  This is complicated by bipolar disease.  She is substantially better today.  She is able to cough up more sputum.  She does not feel as anxious.  She was extremely anxious yesterday and is much better today. Principal Problem:   CAP (community acquired pneumonia) Active Problems:   Hypokalemia   Depression   COPD with acute exacerbation (HCC)   Bipolar disorder (HCC)   Hyperlipidemia   Hypomagnesemia   BiPAP (biphasic positive airway pressure) dependence   Type 2 diabetes mellitus ([place])    Plan: Continue current treatments.    LOS: 6 days   Leiani Enright L 05/31/2017, 8:51 AM

## 2017-05-31 NOTE — Progress Notes (Signed)
PROGRESS NOTE   Latasha [name]  ZOX:[id number]  DOB: Mar 26, 1975  DOA: 05/25/2017 PCP: Remus Loffler, PA-C  Brief Admission Hx: Latasha [name] is a 42 y.o. female with medical history significant of ADHD, anxiety and depression, bipolar disorder, PTSD, osteoarthritis, asthma, chronic neck, back and hip pain, degenerative disc disease, fibromyalgia, history of headaches, hyperlipidemia, history of stroke, type 2 diabetes, history of pneumonia, COPD, tobacco use who is coming to the emergency department with complaints of dyspnea, associated with yellowish sputum producing cough (which occasionally makes her vomit), fatigue and malaise.   MDM/Assessment & Plan:   CAP (community acquired pneumonia) COPD with acute exacerbation  Continue care in stepdown unit.  Continue supplemental oxygen. BiPAP ventilation as needed. Continue bronchodilators. Solu-Medrol IV ordered.  Continue azithromycin 500 mg every 24 hours. Continue ceftriaxone 1 g IVPB every 24 hours. Consulted pulmonology for management recommendations.   Pt was strongly advised/counseled to stop smoking.   Pulmonary team added Mucomyst.   Acute Diastolic CHF -  Continue lasix for diuresis, change to oral lasix 4/1.  Echocardiogram below: Study Conclusions - Left ventricle: The cavity size was normal. Wall thickness was  normal. Systolic function was normal. The estimated ejection   fraction was in the range of 60% to 65%. Doppler parameters are  consistent with abnormal left ventricular relaxation (grade 1   diastolic dysfunction). - Aortic valve: Mildly calcified annulus. Trileaflet; normal thickness leaflets.   I/O last 3 completed shifts: In: 2120 [P.O.:1920; IV Piggyback:200] Out: 3950 [Urine:3950] No intake/output data recorded.  Hypokalemia Replaced. Follow BMP.   Hypomagnesemia. Replacing.  Hypertension Continue amlodipine 10 mg p.o. daily. Monitor blood pressure and heart rate.  Type 2 diabetes  mellitus  Added basal / bolus / supplemental insulin and will follow and titrate as needed CBG (last 3)  Recent Labs    05/30/17 2108 05/31/17 0310 05/31/17 0741  GLUCAP 223* 243* 227*   Bipolar disorder Continue fluoxetine 40 mg p.o. daily. Continue Geodon 80 mg p.o. twice daily. Continue diazepam 5 mg p.o. every 8 hours as needed.   Hyperlipidemia Not on medical therapy.    Chronic pain Continue gabapentin. Continue Suboxone  DVT prophylaxis: Lovenox SQ. Code Status: Full code. Family Communication:  Disposition Plan: Admit for IV antibiotic therapy and COPD exacerbation treatment.  Subjective: Pt says she feels better today.  She had bipap treatment overnight to help her sleep.    Objective: Vitals:   05/31/17 0500 05/31/17 0600 05/31/17 0742 05/31/17 0835  BP: (!) 87/50 (!) 91/53    Pulse: 79 72 80   Resp:      Temp:   98.2 F (36.8 C)   TempSrc:   Oral   SpO2: 92% 92% (!) 88% 94%  Weight: 97.6 kg (215 lb 2.7 oz)     Height:        Intake/Output Summary (Last 24 hours) at 05/31/2017 0849 Last data filed at 05/31/2017 0500 Gross per 24 hour  Intake 1640 ml  Output 3050 ml  Net -1410 ml   Filed Weights   05/29/17 0500 05/30/17 0400 05/31/17 0500  Weight: 98.4 kg (216 lb 14.9 oz) 97.9 kg (215 lb 13.3 oz) 97.6 kg (215 lb 2.7 oz)   REVIEW OF SYSTEMS  As per history otherwise all reviewed and reported negative  Exam:  General exam: awake, alert, NAD.   Respiratory system: diffuse wheezing, rales, crackles heard.  Cardiovascular system: S1 & S2 heard, RRR. No JVD, murmurs, gallops, clicks or pedal edema. Gastrointestinal  system: Abdomen is nondistended, soft and nontender. Normal bowel sounds heard. Central nervous system: Alert and oriented. No focal neurological deficits. Extremities: no CCE.  Data Reviewed: Basic Metabolic Panel: Recent Labs  Lab 05/25/17 2230 05/27/17 1304 05/28/17 0457 05/29/17 0602 05/30/17 0410 05/31/17 0410  NA 133*  137 143 137 141 137  K 3.0* 4.0 4.1 3.6 4.1 4.0  CL 96* 100* 104 93* 94* 92*  CO2 22 23 27 28 28 30   GLUCOSE 210* 262* 197* 307* 214* 244*  BUN 5* 11 11 19  24* 30*  CREATININE 0.70 0.69 0.59 0.63 0.68 0.71  CALCIUM 8.8* 9.1 9.0 9.3 9.3 9.5  MG 1.3* 2.1 2.1 2.0 2.1 2.1  PHOS 2.9  --   --   --   --   --    Liver Function Tests: Recent Labs  Lab 05/27/17 1304 05/28/17 0457 05/29/17 0602  AST 41 30 20  ALT 30 24 23   ALKPHOS 100 93 106  BILITOT 0.6 0.4 0.6  PROT 7.4 7.1 7.5  ALBUMIN 3.4* 3.2* 3.3*   No results for input(s): LIPASE, AMYLASE in the last 168 hours. No results for input(s): AMMONIA in the last 168 hours. CBC: Recent Labs  Lab 05/25/17 2230 05/26/17 0653 05/28/17 0457 05/29/17 0602 05/30/17 0410  WBC 17.5* 14.5* 13.1* 9.1 13.0*  NEUTROABS 15.1* 13.0*  --  7.8*  --   HGB 14.0 14.5 12.8 13.3 13.7  HCT 41.5 42.5 40.2 41.0 41.9  MCV 85.2 86.6 89.7 88.2 87.3  PLT 198 180 202 208 217   Cardiac Enzymes: No results for input(s): CKTOTAL, CKMB, CKMBINDEX, TROPONINI in the last 168 hours. CBG (last 3)  Recent Labs    05/30/17 2108 05/31/17 0310 05/31/17 0741  GLUCAP 223* 243* 227*   Recent Results (from the past 240 hour(s))  MRSA PCR Screening     Status: None   Collection Time: 05/26/17  2:31 PM  Result Value Ref Range Status   MRSA by PCR NEGATIVE NEGATIVE Final    Comment:        The GeneXpert MRSA Assay (FDA approved for NASAL specimens only), is one component of a comprehensive MRSA colonization surveillance program. It is not intended to diagnose MRSA infection nor to guide or monitor treatment for MRSA infections. Performed at [facility], [address] [place], [place] [address]     Studies: [facility]  Result Date: 05/30/2017 CLINICAL DATA:  CHF EXAM: PORTABLE CHEST 1 VIEW COMPARISON:  Yesterday FINDINGS: Diffuse interstitial coarsening with indistinct opacities at the bases symmetrically. Borderline improvement at the right  base. Borderline heart size. No visible effusion or pneumothorax. IMPRESSION: Borderline improvement from yesterday at the right base. CHF and/or bilateral pneumonia. Electronically Signed   By: Marnee SpringJonathon  Watts M.D.   On: 05/30/2017 08:21   Scheduled Meds: . acetylcysteine  3 mL Nebulization Q4H  . amLODipine  10 mg Oral Daily  . azithromycin  500 mg Oral QHS  . buprenorphine  8 mg Sublingual TID  . enoxaparin (LOVENOX) injection  40 mg Subcutaneous Q24H  . FLUoxetine  40 mg Oral Daily  . furosemide  60 mg Intravenous Q12H  . [START ON 06/01/2017] furosemide  40 mg Oral Daily  . gabapentin  800 mg Oral QID  . guaiFENesin  1,200 mg Oral BID  . insulin aspart  0-20 Units Subcutaneous TID WC  . insulin aspart  0-5 Units Subcutaneous QHS  . insulin aspart  16 Units Subcutaneous TID WC  .  insulin glargine  50 Units Subcutaneous Daily  . ipratropium-albuterol  3 mL Nebulization Q4H  . mouth rinse  15 mL Mouth Rinse BID  . mirabegron ER  50 mg Oral Daily  . potassium chloride  30 mEq Oral BID  . senna-docusate  2 tablet Oral QHS  . ziprasidone  80 mg Oral BID WC   Continuous Infusions: . cefTRIAXone (ROCEPHIN)  IV Stopped (05/31/17 0046)    Principal Problem:   CAP (community acquired pneumonia) Active Problems:   Hypokalemia   Depression   COPD with acute exacerbation (HCC)   Bipolar disorder (HCC)   Hyperlipidemia   Hypomagnesemia   BiPAP (biphasic positive airway pressure) dependence   Type 2 diabetes mellitus (HCC)  Critical Care Time spent: 31 mins  Standley Dakins, MD, FAAFP [facility] Pager [phone]  If 7PM-7AM, please contact night-coverage [email or web address] Password [place] 05/31/2017, 8:49 AM    LOS: 6 days

## 2017-05-31 NOTE — Progress Notes (Signed)
Patient stated this morning she was exhausted, so will place on BiPAP for hrs of sleep. Hopefully this will help her rest better at night and give her break as needed. Other than this she appears to be doing well. Have been using met neb for chest vibrations.

## 2017-06-01 DIAGNOSIS — I5031 Acute diastolic (congestive) heart failure: Secondary | ICD-10-CM | POA: Diagnosis present

## 2017-06-01 DIAGNOSIS — J441 Chronic obstructive pulmonary disease with (acute) exacerbation: Secondary | ICD-10-CM

## 2017-06-01 DIAGNOSIS — J9601 Acute respiratory failure with hypoxia: Secondary | ICD-10-CM

## 2017-06-01 DIAGNOSIS — F319 Bipolar disorder, unspecified: Secondary | ICD-10-CM

## 2017-06-01 DIAGNOSIS — E118 Type 2 diabetes mellitus with unspecified complications: Secondary | ICD-10-CM

## 2017-06-01 DIAGNOSIS — J181 Lobar pneumonia, unspecified organism: Secondary | ICD-10-CM

## 2017-06-01 DIAGNOSIS — Z794 Long term (current) use of insulin: Secondary | ICD-10-CM

## 2017-06-01 LAB — BASIC METABOLIC PANEL
ANION GAP: 15 (ref 5–15)
BUN: 24 mg/dL — ABNORMAL HIGH (ref 6–20)
CALCIUM: 9.3 mg/dL (ref 8.9–10.3)
CO2: 31 mmol/L (ref 22–32)
Chloride: 92 mmol/L — ABNORMAL LOW (ref 101–111)
Creatinine, Ser: 0.77 mg/dL (ref 0.44–1.00)
GLUCOSE: 98 mg/dL (ref 65–99)
Potassium: 3.8 mmol/L (ref 3.5–5.1)
Sodium: 138 mmol/L (ref 135–145)

## 2017-06-01 LAB — GLUCOSE, CAPILLARY
GLUCOSE-CAPILLARY: 88 mg/dL (ref 65–99)
GLUCOSE-CAPILLARY: 213 mg/dL — AB (ref 65–99)
GLUCOSE-CAPILLARY: 255 mg/dL — AB (ref 65–99)
Glucose-Capillary: 132 mg/dL — ABNORMAL HIGH (ref 65–99)
Glucose-Capillary: 150 mg/dL — ABNORMAL HIGH (ref 65–99)
Glucose-Capillary: 232 mg/dL — ABNORMAL HIGH (ref 65–99)

## 2017-06-01 LAB — MAGNESIUM: Magnesium: 2.2 mg/dL (ref 1.7–2.4)

## 2017-06-01 LAB — BRAIN NATRIURETIC PEPTIDE: B NATRIURETIC PEPTIDE 5: 6 pg/mL (ref 0.0–100.0)

## 2017-06-01 MED ORDER — IPRATROPIUM-ALBUTEROL 0.5-2.5 (3) MG/3ML IN SOLN
3.0000 mL | Freq: Four times a day (QID) | RESPIRATORY_TRACT | Status: DC
  Administered 2017-06-01 – 2017-06-04 (×13): 3 mL via RESPIRATORY_TRACT
  Filled 2017-06-01 (×12): qty 3

## 2017-06-01 MED ORDER — ACETYLCYSTEINE 20 % IN SOLN
3.0000 mL | Freq: Four times a day (QID) | RESPIRATORY_TRACT | Status: DC
  Administered 2017-06-01 (×2): 3 mL via RESPIRATORY_TRACT
  Filled 2017-06-01 (×2): qty 4

## 2017-06-01 MED ORDER — METHYLPREDNISOLONE SODIUM SUCC 125 MG IJ SOLR
60.0000 mg | Freq: Two times a day (BID) | INTRAMUSCULAR | Status: DC
Start: 2017-06-01 — End: 2017-06-02
  Administered 2017-06-01 – 2017-06-02 (×2): 60 mg via INTRAVENOUS
  Filled 2017-06-01 (×2): qty 2

## 2017-06-01 MED ORDER — ACETYLCYSTEINE 20 % IN SOLN
3.0000 mL | Freq: Four times a day (QID) | RESPIRATORY_TRACT | Status: DC
  Administered 2017-06-01 – 2017-06-02 (×2): 2 mL via RESPIRATORY_TRACT
  Administered 2017-06-02: 3 mL via RESPIRATORY_TRACT
  Filled 2017-06-01 (×3): qty 4

## 2017-06-01 NOTE — Progress Notes (Signed)
Diabetes education  RD consulted for nutrition education regarding diabetes. The patient lives with 7 other people and says three of which have diabetes. All have been eating whatever and whenever they want to and are taking their diabetes medicine.   Lab Results  Component Value Date   HGBA1C 9.9 (H) 05/25/2017    RD provided "Plate Method for People with Diabetes" handout. Discussed different food groups and their effects on blood sugar, emphasizing carbohydrate-containing foods. Provided list of carbohydrates and recommended serving sizes of common foods.   Patient often skips meals amd sometimes does not eat until evening. She rarely eats any food in the mornings. Discussed importance of controlled and consistent carbohydrate intake throughout the day.    Provided examples of ways to balance meals/snacks and encouraged intake of high-fiber, whole grain complex carbohydrates. Recommend to try various whole grain bars such as: [place], [place]ashi, Fiber one bars to help her develop a daily breakfast routine.Teach back method used.  Expect good compliance. Her mom passed away in January with a heart attack and she says she doesn't want that for herself.   The patient says she is ready to make dietary changes in order to improve her health and to decrease risk. Self-reported barriers are all-day consumption of Sun-drop soft drinks and skipping meals.   Body mass index is 33.56 kg/m. Pt meets criteria for obese based on current BMI.  Current diet order is CHO modified /Heart Healthy, patient is consuming approximately 75% of meals at this time. Labs and medications reviewed. No further nutrition interventions warranted at this time. RD contact information provided. If additional nutrition issues arise, please re-consult RD.    Royann ShiversLynn Alazne Quant MS,RD,CSG,LDN Office: [phone] Pager: [phone]

## 2017-06-01 NOTE — Progress Notes (Addendum)
Patient back on HFNC placed by nurse , patient states she got a little panicky while on BiPAP. Also decreased mucomyst to while awake txs.

## 2017-06-01 NOTE — Progress Notes (Signed)
Subjective: She says she feels much better.  No new complaints.  She is still coughing up sputum.  She is still requiring high flow nasal cannula  Objective: Vital signs in last 24 hours: Temp:  [97.7 F (36.5 C)-98.3 F (36.8 C)] 98.1 F (36.7 C) (04/01 0400) Pulse Rate:  [67-88] 84 (04/01 0500) Resp:  [10-22] 13 (04/01 0500) BP: (98-125)/(61-84) 99/83 (04/01 0500) SpO2:  [87 %-96 %] 92 % (04/01 0801) FiO2 (%):  [70 %-100 %] 90 % (04/01 0801) Weight:  [97.2 kg (214 lb 4.6 oz)] 97.2 kg (214 lb 4.6 oz) (04/01 0500) Weight change: -0.4 kg (-14.1 oz) Last BM Date: 05/29/17  Intake/Output from previous day: 03/31 0701 - 04/01 0700 In: 480 [P.O.:480] Out: 1000 [Urine:1000]  PHYSICAL EXAM General appearance: alert, cooperative and no distress Resp: She has bilateral rhonchi but is moving air much better Cardio: regular rate and rhythm, S1, S2 normal, no murmur, click, rub or gallop GI: soft, non-tender; bowel sounds normal; no masses,  no organomegaly Extremities: extremities normal, atraumatic, no cyanosis or edema  Lab Results:  Results for orders placed or performed during the hospital encounter of 05/25/17 (from the past 48 hour(s))  Glucose, capillary     Status: Abnormal   Collection Time: 05/30/17 11:21 AM  Result Value Ref Range   Glucose-Capillary 171 (H) 65 - 99 mg/dL  Blood gas, arterial     Status: Abnormal   Collection Time: 05/30/17 11:55 AM  Result Value Ref Range   FIO2 0.90    O2 Content 30.0 L/min   Delivery systems HI FLOW NASAL CANNULA    pH, Arterial 7.467 (H) 7.350 - 7.450   pCO2 arterial 47.3 32.0 - 48.0 mmHg   pO2, Arterial 110 (H) 83.0 - 108.0 mmHg   Bicarbonate 32.6 (H) 20.0 - 28.0 mmol/L   Acid-Base Excess 9.6 (H) 0.0 - 2.0 mmol/L   O2 Saturation 98.1 %   Patient temperature 37.0    Collection site LEFT RADIAL    Drawn by [id number]    Sample type ARTERIAL DRAW    Allens test (pass/fail) PASS PASS    Comment: Performed at [facility], [address], [place], [place] [address]  Glucose, capillary     Status: None   Collection Time: 05/30/17  4:17 PM  Result Value Ref Range   Glucose-Capillary 98 65 - 99 mg/dL  Glucose, capillary     Status: Abnormal   Collection Time: 05/30/17  9:08 PM  Result Value Ref Range   Glucose-Capillary 223 (H) 65 - 99 mg/dL   Comment 1 Notify RN   Glucose, capillary     Status: Abnormal   Collection Time: 05/31/17  3:10 AM  Result Value Ref Range   Glucose-Capillary 243 (H) 65 - 99 mg/dL  Basic metabolic panel     Status: Abnormal   Collection Time: 05/31/17  4:10 AM  Result Value Ref Range   Sodium 137 135 - 145 mmol/L   Potassium 4.0 3.5 - 5.1 mmol/L   Chloride 92 (L) 101 - 111 mmol/L   CO2 30 22 - 32 mmol/L   Glucose, Bld 244 (H) 65 - 99 mg/dL   BUN 30 (H) 6 - 20 mg/dL   Creatinine, Ser 0.71 0.44 - 1.00 mg/dL   Calcium 9.5 8.9 - 10.3 mg/dL   GFR calc non Af Amer >60 >60 mL/min   GFR calc Af Amer >60 >60 mL/min    Comment: (NOTE) The eGFR has been calculated using the  CKD EPI equation. This calculation has not been validated in all clinical situations. eGFR's persistently <60 mL/min signify possible Chronic Kidney Disease.    Anion gap 15 5 - 15    Comment: Performed at [facility], [address] [place], [place] [address]  Magnesium     Status: None   Collection Time: 05/31/17  4:10 AM  Result Value Ref Range   Magnesium 2.1 1.7 - 2.4 mg/dL    Comment: Performed at [facility], [address] [place], [place] [address]  Glucose, capillary     Status: Abnormal   Collection Time: 05/31/17  7:41 AM  Result Value Ref Range   Glucose-Capillary 227 (H) 65 - 99 mg/dL  Glucose, capillary     Status: Abnormal   Collection Time: 05/31/17 11:24 AM  Result Value Ref Range   Glucose-Capillary 62 (L) 65 - 99 mg/dL   Comment 1 Notify RN   Glucose, capillary     Status: Abnormal   Collection Time: 05/31/17 11:57 AM  Result Value Ref Range   Glucose-Capillary 222 (H) 65 - 99 mg/dL  Glucose,  capillary     Status: None   Collection Time: 05/31/17  4:35 PM  Result Value Ref Range   Glucose-Capillary 87 65 - 99 mg/dL  Glucose, capillary     Status: Abnormal   Collection Time: 05/31/17  9:17 PM  Result Value Ref Range   Glucose-Capillary 133 (H) 65 - 99 mg/dL   Comment 1 Notify RN   Glucose, capillary     Status: None   Collection Time: 06/01/17  3:17 AM  Result Value Ref Range   Glucose-Capillary 88 65 - 99 mg/dL   Comment 1 Notify RN   Basic metabolic panel     Status: Abnormal   Collection Time: 06/01/17  5:06 AM  Result Value Ref Range   Sodium 138 135 - 145 mmol/L   Potassium 3.8 3.5 - 5.1 mmol/L   Chloride 92 (L) 101 - 111 mmol/L   CO2 31 22 - 32 mmol/L   Glucose, Bld 98 65 - 99 mg/dL   BUN 24 (H) 6 - 20 mg/dL   Creatinine, Ser 0.77 0.44 - 1.00 mg/dL   Calcium 9.3 8.9 - 10.3 mg/dL   GFR calc non Af Amer >60 >60 mL/min   GFR calc Af Amer >60 >60 mL/min    Comment: (NOTE) The eGFR has been calculated using the CKD EPI equation. This calculation has not been validated in all clinical situations. eGFR's persistently <60 mL/min signify possible Chronic Kidney Disease.    Anion gap 15 5 - 15    Comment: Performed at [facility], [address], [place], [place] [address]  Magnesium     Status: None   Collection Time: 06/01/17  5:06 AM  Result Value Ref Range   Magnesium 2.2 1.7 - 2.4 mg/dL    Comment: Performed at [facility], [address] [place], [place] [address]    ABGS Recent Labs    05/30/17 1155  PHART 7.467*  PO2ART 110*  HCO3 32.6*   CULTURES Recent Results (from the past 240 hour(s))  MRSA PCR Screening     Status: None   Collection Time: 05/26/17  2:31 PM  Result Value Ref Range Status   MRSA by PCR NEGATIVE NEGATIVE Final    Comment:        The GeneXpert MRSA Assay (FDA approved for NASAL specimens only), is one component of a comprehensive MRSA colonization surveillance program. It  is not intended to diagnose MRSA infection nor  to guide or monitor treatment for MRSA infections. Performed at [facility], [address] [place], [place] [address]    Studies/Results: No results found.  Medications:  Prior to Admission:  Facility-Administered Medications Prior to Admission  Medication Dose Route Frequency Provider Last Rate Last Dose  . levalbuterol (XOPENEX) nebulizer solution 0.63 mg  0.63 mg Nebulization Once Magdalen Spatz, NP       Medications Prior to Admission  Medication Sig Dispense Refill Last Dose  . albuterol (PROVENTIL HFA;VENTOLIN HFA) 108 (90 Base) MCG/ACT inhaler Inhale 2 puffs into the lungs every 6 (six) hours as needed for wheezing or shortness of breath. 1 Inhaler 5 05/25/2017 at Unknown time  . amLODipine (NORVASC) 10 MG tablet Take 1 tablet (10 mg total) by mouth daily. 90 tablet 3 05/25/2017 at Unknown time  . diazepam (VALIUM) 10 MG tablet Take 0.5 tablets (5 mg total) by mouth every 8 (eight) hours as needed for anxiety. 30 tablet 0 05/24/2017 at Unknown time  . FLUoxetine (PROZAC) 40 MG capsule Take 40 mg by mouth daily.    05/25/2017 at Unknown time  . Fluticasone-Umeclidin-Vilant (TRELEGY ELLIPTA) 100-62.5-25 MCG/INH AEPB Inhale 1 Inhaler into the lungs daily. written by Dr. Luan Pulling   05/25/2017 at Unknown time  . gabapentin (NEURONTIN) 800 MG tablet Take 1 tablet (800 mg total) 4 (four) times daily by mouth. 120 tablet 0 05/25/2017 at Unknown time  . glipiZIDE (GLUCOTROL) 5 MG tablet TAKE 1 TABLET DAILY BEFORE BREAKFAST. 30 tablet 0 05/25/2017 at Unknown time  . metFORMIN (GLUCOPHAGE) 500 MG tablet Take 1 tablet (500 mg total) by mouth 2 (two) times daily with a meal. 60 tablet 3 05/25/2017 at Unknown time  . mirabegron ER (MYRBETRIQ) 50 MG TB24 tablet Take 1 tablet (50 mg total) by mouth daily. 30 tablet 11 05/25/2017 at Unknown time  . OXYGEN Inhale 2 L into the lungs daily as needed.   unknown at Unknown time  . SUBOXONE 8-2 MG FILM Take 1 Film by mouth 3 (three) times daily.  0 05/25/2017 at  Unknown time  . ziprasidone (GEODON) 80 MG capsule Take 80 mg by mouth 2 (two) times daily with a meal.    05/25/2017 at Unknown time  . Blood Glucose Monitoring Suppl (ONETOUCH VERIO) w/Device KIT 1 kit by Does not apply route 2 (two) times daily. 1 kit 0 Taking  . glucose blood (ONETOUCH VERIO) test strip Use as instructed 100 each 12 Taking  . Lancets (ONETOUCH ULTRASOFT) lancets Use as instructed 100 each 12 Taking   Scheduled: . acetylcysteine  3 mL Nebulization QID  . amLODipine  10 mg Oral Daily  . buprenorphine  8 mg Sublingual TID  . enoxaparin (LOVENOX) injection  40 mg Subcutaneous Q24H  . FLUoxetine  40 mg Oral Daily  . furosemide  40 mg Oral Daily  . gabapentin  800 mg Oral QID  . guaiFENesin  1,200 mg Oral BID  . insulin aspart  0-20 Units Subcutaneous TID WC  . insulin aspart  0-5 Units Subcutaneous QHS  . insulin aspart  14 Units Subcutaneous TID WC  . insulin glargine  50 Units Subcutaneous Daily  . ipratropium-albuterol  3 mL Nebulization Q4H  . mouth rinse  15 mL Mouth Rinse BID  . mirabegron ER  50 mg Oral Daily  . potassium chloride  30 mEq Oral BID  . senna-docusate  2 tablet Oral QHS  . ziprasidone  80 mg  Oral BID WC   Continuous: . cefTRIAXone (ROCEPHIN)  IV Stopped (06/01/17 0538)   AEW:[id number], albuterol, diazepam, LORazepam  Assesment: She was admitted with community-acquired pneumonia COPD exacerbation and acute on chronic hypoxic respiratory failure.  She is improving.  She is coughing up more sputum.  She is moving air better.  She is still requiring high flow nasal cannula and/or BiPAP  She has bipolar disease and her anxiety from that has complicated her treatment but she does seem to be doing better now. Principal Problem:   CAP (community acquired pneumonia) Active Problems:   Hypokalemia   Depression   COPD with acute exacerbation (HCC)   Bipolar disorder ([place])   Hyperlipidemia   Hypomagnesemia   BiPAP (biphasic positive airway  pressure) dependence   Type 2 diabetes mellitus ([place])    Plan: Continue current treatments no changes in meds.  Clearly getting better    LOS: 7 days   Dianelly Ferran L 06/01/2017, 8:28 AM

## 2017-06-01 NOTE — Progress Notes (Addendum)
Patient has been placed on BIPAP for sleep. She did well earlier with meta neb and coughed up sputum numerous times. Managed also to due some CPT with bed and flutter at end of therapy. She is still requiring high oxygen to maintain Saturation in low 90's. Suspect she is still on wet side with fluids. Also don't think Saturations on monitor are exactly correct. But  She is improving.

## 2017-06-01 NOTE — Progress Notes (Addendum)
Inpatient Diabetes Program Recommendations  AACE/ADA: New Consensus Statement on Inpatient Glycemic Control (2015)  Target Ranges:  Prepandial:   less than 140 mg/dL      Peak postprandial:   less than 180 mg/dL (1-2 hours)      Critically ill patients:  140 - 180 mg/dL   Results for Latasha [name], Latasha [name] (MRN [id number]) as of 06/01/2017 08:31  Ref. Range 05/31/2017 07:41 05/31/2017 11:24 05/31/2017 11:57 05/31/2017 16:35 05/31/2017 21:17 06/01/2017 03:17  Glucose-Capillary Latest Ref Range: 65 - 99 mg/dL 782227 (H)  Novolog 23 units 62 (L)  Lantus 50 units 222 (H) 87 133 (H) 88  Results for Latasha [name], Latasha [name] (MRN [id number]) as of 06/01/2017 08:31  Ref. Range 05/25/2017 22:30  Hemoglobin A1C Latest Ref Range: 4.8 - 5.6 % 9.9 (H)   Review of Glycemic Control  Diabetes history: DM2 Outpatient Diabetes medications: Metformin 500 mg BID, Glipizide 5 mg daily Current orders for Inpatient glycemic control: Lantus 50 units daily, Novolog 14 units TID with meals, Novolog 0-20 units TID with meals, Novolog 0-5 units QHS  Inpatient Diabetes Program Recommendations:  HgbA1C: A1C 9.9% on 05/25/2017 indicating an average glucose of 237 mg/dl over the past 2-3 months. Patient reports she has been consistently taking Metformin and Glipizide and glucose has been 250 mg/dl or higher. Patient will likely need to be discharged new to insulin. If so, patient prefers insulin pens.  NOTE: Noted steroids had been discontinued and glucose was trending back down. Talked with Dr. Kerry HoughMemon today and steroids were reordered (Solumedrol 60 mg Q12H). Will watch trends today. Will likely need to adjust insulin as steroids are tapered.  Thanks, Orlando PennerMarie Crystle Carelli, RN, MSN, CDE Diabetes Coordinator Inpatient Diabetes Program [phone] (Team Pager from 8am to 5pm)

## 2017-06-01 NOTE — Progress Notes (Signed)
PROGRESS NOTE    AVAMAE DEHAAN  WUJ:[id number] DOB: Nov 06, 1975 DOA: 05/25/2017 PCP: Remus Loffler, PA-C    Brief Narrative:  42 year old female with a history of COPD, bipolar disorder, presented to the hospital with worsening shortness of breath and respiratory failure.  Found to have community-acquired pneumonia/COPD exacerbation/CHF exacerbation and required BiPAP therapy.  She is currently on high flow nasal cannula and is still at high risk for decompensation.   Assessment & Plan:   Principal Problem:   CAP (community acquired pneumonia) Active Problems:   Hypokalemia   Depression   COPD with acute exacerbation (HCC)   Bipolar disorder (HCC)   Hyperlipidemia   Hypomagnesemia   BiPAP (biphasic positive airway pressure) dependence   Type 2 diabetes mellitus (HCC)   1. Acute respiratory failure with hypoxia.  Patient does not use any supplemental oxygen at home.  She has it ordered as needed, but normally does not require it.  On admission, she required BiPAP therapy.  She is currently on high flow nasal cannula at 25 L.  She required BiPAP overnight.  Is still short of breath.  Continue current treatments. 2. Community-acquired pneumonia.  X-ray shows bilateral pneumonia.  Currently on ceftriaxone.  She completed 4 days of azithromycin.  Continue pulmonary hygiene  3. COPD exacerbation.  Still very short of breath and has bilateral wheezes.  Continue on bronchodilators. Will restart on IV steroids.  Continue pulmonary hygiene. 4. Acute diastolic congestive heart failure.  Initially treated with IV Lasix, now on oral Lasix.  Does not appear to be particularly volume overloaded at this time.  Check BNP. 5. Diabetes.  On basal/bolus regimen.  Blood sugar stable.  Continue to monitor since she being restarted on steroids. 6. Bipolar disorder.  Continue on home psychotropic medications. 7. Chronic pain.  Continue on gabapentin and Suboxone. 8. Hypertension.  Currently on amlodipine.   Blood pressures currently stable.   DVT prophylaxis: Lovenox Code Status: Full code Family Communication: No family present Disposition Plan: Potentially discharge home once respiratory status has stabilized   Consultants:   Pulmonology  Procedures:  Echo: - Left ventricle: The cavity size was normal. Wall thickness was   normal. Systolic function was normal. The estimated ejection   fraction was in the range of 60% to 65%. Doppler parameters are   consistent with abnormal left ventricular relaxation (grade 1   diastolic dysfunction). - Aortic valve: Mildly calcified annulus. Trileaflet; normal   thickness leaflets. Valve area (VTI): 2.39 cm^2. Valve area    (Vmax): 2.36 cm^2. Valve area (Vmean): 2.23 cm^2.  Antimicrobials:   Azithromycin 3/20 rate >3/31  Ceftriaxone 3/27 >   Subjective: Still feels short of breath.  Has productive cough.  Still continues to wheeze.  Objective: Vitals:   06/01/17 0500 06/01/17 0801 06/01/17 0959 06/01/17 1113  BP: 99/83  116/73   Pulse: 84     Resp: 13     Temp:  98 F (36.7 C)    TempSrc:  Axillary    SpO2: 93% 92%  95%  Weight: 97.2 kg (214 lb 4.6 oz)     Height:        Intake/Output Summary (Last 24 hours) at 06/01/2017 1123 Last data filed at 06/01/2017 0200 Gross per 24 hour  Intake 480 ml  Output 1000 ml  Net -520 ml   Filed Weights   05/30/17 0400 05/31/17 0500 06/01/17 0500  Weight: 97.9 kg (215 lb 13.3 oz) 97.6 kg (215 lb 2.7 oz) 97.2 kg (214  lb 4.6 oz)    Examination:  General exam: Appears calm and comfortable  Respiratory system: Bilateral wheezes.  Increased respiratory effort. Cardiovascular system: S1 & S2 heard, RRR. No JVD, murmurs, rubs, gallops or clicks. No pedal edema. Gastrointestinal system: Abdomen is nondistended, soft and nontender. No organomegaly or masses felt. Normal bowel sounds heard. Central nervous system: Alert and oriented. No focal neurological deficits. Extremities: Symmetric 5 x 5  power. Skin: No rashes, lesions or ulcers Psychiatry: Judgement and insight appear normal. Mood & affect appropriate.     Data Reviewed: I have personally reviewed following labs and imaging studies  CBC: Recent Labs  Lab 05/25/17 2230 05/26/17 0653 05/28/17 0457 05/29/17 0602 05/30/17 0410  WBC 17.5* 14.5* 13.1* 9.1 13.0*  NEUTROABS 15.1* 13.0*  --  7.8*  --   HGB 14.0 14.5 12.8 13.3 13.7  HCT 41.5 42.5 40.2 41.0 41.9  MCV 85.2 86.6 89.7 88.2 87.3  PLT 198 180 202 208 217   Basic Metabolic Panel: Recent Labs  Lab 05/25/17 2230  05/28/17 0457 05/29/17 0602 05/30/17 0410 05/31/17 0410 06/01/17 0506  NA 133*   < > 143 137 141 137 138  K 3.0*   < > 4.1 3.6 4.1 4.0 3.8  CL 96*   < > 104 93* 94* 92* 92*  CO2 22   < > 27 28 28 30 31   GLUCOSE 210*   < > 197* 307* 214* 244* 98  BUN 5*   < > 11 19 24* 30* 24*  CREATININE 0.70   < > 0.59 0.63 0.68 0.71 0.77  CALCIUM 8.8*   < > 9.0 9.3 9.3 9.5 9.3  MG 1.3*   < > 2.1 2.0 2.1 2.1 2.2  PHOS 2.9  --   --   --   --   --   --    < > = values in this interval not displayed.   GFR: Estimated Creatinine Clearance: 111.9 mL/min (by C-G formula based on SCr of 0.77 mg/dL). Liver Function Tests: Recent Labs  Lab 05/27/17 1304 05/28/17 0457 05/29/17 0602  AST 41 30 20  ALT 30 24 23   ALKPHOS 100 93 106  BILITOT 0.6 0.4 0.6  PROT 7.4 7.1 7.5  ALBUMIN 3.4* 3.2* 3.3*   No results for input(s): LIPASE, AMYLASE in the last 168 hours. No results for input(s): AMMONIA in the last 168 hours. Coagulation Profile: No results for input(s): INR, PROTIME in the last 168 hours. Cardiac Enzymes: No results for input(s): CKTOTAL, CKMB, CKMBINDEX, TROPONINI in the last 168 hours. BNP (last 3 results) No results for input(s): PROBNP in the last 8760 hours. HbA1C: No results for input(s): HGBA1C in the last 72 hours. CBG: Recent Labs  Lab 05/31/17 1635 05/31/17 2117 06/01/17 0317 06/01/17 0758 06/01/17 0859  GLUCAP 87 133* 88 132*  213*   Lipid Profile: No results for input(s): CHOL, HDL, LDLCALC, TRIG, CHOLHDL, LDLDIRECT in the last 72 hours. Thyroid Function Tests: No results for input(s): TSH, T4TOTAL, FREET4, T3FREE, THYROIDAB in the last 72 hours. Anemia Panel: No results for input(s): VITAMINB12, FOLATE, FERRITIN, TIBC, IRON, RETICCTPCT in the last 72 hours. Sepsis Labs: No results for input(s): PROCALCITON, LATICACIDVEN in the last 168 hours.  Recent Results (from the past 240 hour(s))  MRSA PCR Screening     Status: None   Collection Time: 05/26/17  2:31 PM  Result Value Ref Range Status   MRSA by PCR NEGATIVE NEGATIVE Final    Comment:  The GeneXpert MRSA Assay (FDA approved for NASAL specimens only), is one component of a comprehensive MRSA colonization surveillance program. It is not intended to diagnose MRSA infection nor to guide or monitor treatment for MRSA infections. Performed at [facility], [address] [place], [place] [address]          Radiology Studies: No results found.      Scheduled Meds: . acetylcysteine  3 mL Nebulization Q6H  . amLODipine  10 mg Oral Daily  . buprenorphine  8 mg Sublingual TID  . enoxaparin (LOVENOX) injection  40 mg Subcutaneous Q24H  . FLUoxetine  40 mg Oral Daily  . furosemide  40 mg Oral Daily  . gabapentin  800 mg Oral QID  . guaiFENesin  1,200 mg Oral BID  . insulin aspart  0-20 Units Subcutaneous TID WC  . insulin aspart  0-5 Units Subcutaneous QHS  . insulin aspart  14 Units Subcutaneous TID WC  . insulin glargine  50 Units Subcutaneous Daily  . ipratropium-albuterol  3 mL Nebulization Q6H  . mouth rinse  15 mL Mouth Rinse BID  . methylPREDNISolone (SOLU-MEDROL) injection  60 mg Intravenous Q12H  . mirabegron ER  50 mg Oral Daily  . potassium chloride  30 mEq Oral BID  . senna-docusate  2 tablet Oral QHS  . ziprasidone  80 mg Oral BID WC   Continuous Infusions: . cefTRIAXone (ROCEPHIN)  IV Stopped (06/01/17 0538)      LOS: 7 days    Time spent:    Erick Blinks, MD [facility] Pager [phone]  If 7PM-7AM, please contact night-coverage [email or web address] Password [facility] 06/01/2017, 11:23 AM

## 2017-06-02 ENCOUNTER — Other Ambulatory Visit: Payer: Self-pay

## 2017-06-02 LAB — BASIC METABOLIC PANEL
ANION GAP: 10 (ref 5–15)
BUN: 22 mg/dL — ABNORMAL HIGH (ref 6–20)
CALCIUM: 9.4 mg/dL (ref 8.9–10.3)
CO2: 31 mmol/L (ref 22–32)
Chloride: 93 mmol/L — ABNORMAL LOW (ref 101–111)
Creatinine, Ser: 0.66 mg/dL (ref 0.44–1.00)
GLUCOSE: 311 mg/dL — AB (ref 65–99)
POTASSIUM: 4.6 mmol/L (ref 3.5–5.1)
SODIUM: 134 mmol/L — AB (ref 135–145)

## 2017-06-02 LAB — MAGNESIUM: MAGNESIUM: 2.1 mg/dL (ref 1.7–2.4)

## 2017-06-02 LAB — GLUCOSE, CAPILLARY
GLUCOSE-CAPILLARY: 292 mg/dL — AB (ref 65–99)
GLUCOSE-CAPILLARY: 106 mg/dL — AB (ref 65–99)
Glucose-Capillary: 259 mg/dL — ABNORMAL HIGH (ref 65–99)
Glucose-Capillary: 109 mg/dL — ABNORMAL HIGH (ref 65–99)
Glucose-Capillary: 93 mg/dL (ref 65–99)

## 2017-06-02 MED ORDER — PREDNISONE 20 MG PO TABS
40.0000 mg | ORAL_TABLET | Freq: Every day | ORAL | Status: DC
  Administered 2017-06-02 – 2017-06-05 (×4): 40 mg via ORAL
  Filled 2017-06-02 (×4): qty 2

## 2017-06-02 NOTE — Progress Notes (Signed)
Subjective: She says she feels better and wants to go home.  She is still coughing.  She is on 2 L nasal cannula  Objective: Vital signs in last 24 hours: Temp:  [97.9 F (36.6 C)-98.7 F (37.1 C)] 97.9 F (36.6 C) (04/02 0400) Pulse Rate:  [64-91] 86 (04/02 0600) Resp:  [11-23] 19 (04/02 0600) BP: (113-141)/(68-83) 141/82 (04/02 0600) SpO2:  [92 %-99 %] 94 % (04/02 0829) FiO2 (%):  [80 %-90 %] 80 % (04/02 0829) Weight:  [97.8 kg (215 lb 9.8 oz)] 97.8 kg (215 lb 9.8 oz) (04/02 0500) Weight change: 0.6 kg (1 lb 5.2 oz) Last BM Date: 05/30/17  Intake/Output from previous day: 04/01 0701 - 04/02 0700 In: 1400 [P.O.:1200; IV Piggyback:200] Out: 2125 [Urine:2125]  PHYSICAL EXAM General appearance: alert, cooperative and no distress Resp: She still has rhonchi and wheezing but better than yesterday Cardio: regular rate and rhythm, S1, S2 normal, no murmur, click, rub or gallop GI: soft, non-tender; bowel sounds normal; no masses,  no organomegaly Extremities: extremities normal, atraumatic, no cyanosis or edema  Lab Results:  Results for orders placed or performed during the hospital encounter of 05/25/17 (from the past 48 hour(s))  Glucose, capillary     Status: Abnormal   Collection Time: 05/31/17 11:24 AM  Result Value Ref Range   Glucose-Capillary 62 (L) 65 - 99 mg/dL   Comment 1 Notify RN   Glucose, capillary     Status: Abnormal   Collection Time: 05/31/17 11:57 AM  Result Value Ref Range   Glucose-Capillary 222 (H) 65 - 99 mg/dL  Glucose, capillary     Status: None   Collection Time: 05/31/17  4:35 PM  Result Value Ref Range   Glucose-Capillary 87 65 - 99 mg/dL  Glucose, capillary     Status: Abnormal   Collection Time: 05/31/17  9:17 PM  Result Value Ref Range   Glucose-Capillary 133 (H) 65 - 99 mg/dL   Comment 1 Notify RN   Glucose, capillary     Status: None   Collection Time: 06/01/17  3:17 AM  Result Value Ref Range   Glucose-Capillary 88 65 - 99 mg/dL   Comment 1 Notify RN   Basic metabolic panel     Status: Abnormal   Collection Time: 06/01/17  5:06 AM  Result Value Ref Range   Sodium 138 135 - 145 mmol/L   Potassium 3.8 3.5 - 5.1 mmol/L   Chloride 92 (L) 101 - 111 mmol/L   CO2 31 22 - 32 mmol/L   Glucose, Bld 98 65 - 99 mg/dL   BUN 24 (H) 6 - 20 mg/dL   Creatinine, Ser 0.77 0.44 - 1.00 mg/dL   Calcium 9.3 8.9 - 10.3 mg/dL   GFR calc non Af Amer >60 >60 mL/min   GFR calc Af Amer >60 >60 mL/min    Comment: (NOTE) The eGFR has been calculated using the CKD EPI equation. This calculation has not been validated in all clinical situations. eGFR's persistently <60 mL/min signify possible Chronic Kidney Disease.    Anion gap 15 5 - 15    Comment: Performed at [facility], [address] [place], [place] [address]  Magnesium     Status: None   Collection Time: 06/01/17  5:06 AM  Result Value Ref Range   Magnesium 2.2 1.7 - 2.4 mg/dL    Comment: Performed at [facility], [address], [place], [place] [address]  Glucose, capillary     Status: Abnormal  Collection Time: 06/01/17  7:58 AM  Result Value Ref Range   Glucose-Capillary 132 (H) 65 - 99 mg/dL  Glucose, capillary     Status: Abnormal   Collection Time: 06/01/17  8:59 AM  Result Value Ref Range   Glucose-Capillary 213 (H) 65 - 99 mg/dL  Glucose, capillary     Status: Abnormal   Collection Time: 06/01/17 11:03 AM  Result Value Ref Range   Glucose-Capillary 150 (H) 65 - 99 mg/dL  Brain natriuretic peptide     Status: None   Collection Time: 06/01/17  1:05 PM  Result Value Ref Range   B Natriuretic Peptide 6.0 0.0 - 100.0 pg/mL    Comment: Performed at [facility], [address] [place], [place] [address]  Glucose, capillary     Status: Abnormal   Collection Time: 06/01/17  5:01 PM  Result Value Ref Range   Glucose-Capillary 232 (H) 65 - 99 mg/dL  Glucose, capillary     Status: Abnormal   Collection Time: 06/01/17  9:42 PM  Result Value Ref Range    Glucose-Capillary 255 (H) 65 - 99 mg/dL  Glucose, capillary     Status: Abnormal   Collection Time: 06/02/17  3:17 AM  Result Value Ref Range   Glucose-Capillary 259 (H) 65 - 99 mg/dL  Basic metabolic panel     Status: Abnormal   Collection Time: 06/02/17  5:26 AM  Result Value Ref Range   Sodium 134 (L) 135 - 145 mmol/L   Potassium 4.6 3.5 - 5.1 mmol/L    Comment: DELTA CHECK NOTED   Chloride 93 (L) 101 - 111 mmol/L   CO2 31 22 - 32 mmol/L   Glucose, Bld 311 (H) 65 - 99 mg/dL   BUN 22 (H) 6 - 20 mg/dL   Creatinine, Ser 0.66 0.44 - 1.00 mg/dL   Calcium 9.4 8.9 - 10.3 mg/dL   GFR calc non Af Amer >60 >60 mL/min   GFR calc Af Amer >60 >60 mL/min    Comment: (NOTE) The eGFR has been calculated using the CKD EPI equation. This calculation has not been validated in all clinical situations. eGFR's persistently <60 mL/min signify possible Chronic Kidney Disease.    Anion gap 10 5 - 15    Comment: Performed at [facility], [address], [place], [place] [address]  Magnesium     Status: None   Collection Time: 06/02/17  5:26 AM  Result Value Ref Range   Magnesium 2.1 1.7 - 2.4 mg/dL    Comment: Performed at [facility], [address] [place], [place] [address]  Glucose, capillary     Status: Abnormal   Collection Time: 06/02/17  8:01 AM  Result Value Ref Range   Glucose-Capillary 292 (H) 65 - 99 mg/dL    ABGS Recent Labs    05/30/17 1155  PHART 7.467*  PO2ART 110*  HCO3 32.6*   CULTURES Recent Results (from the past 240 hour(s))  MRSA PCR Screening     Status: None   Collection Time: 05/26/17  2:31 PM  Result Value Ref Range Status   MRSA by PCR NEGATIVE NEGATIVE Final    Comment:        The GeneXpert MRSA Assay (FDA approved for NASAL specimens only), is one component of a comprehensive MRSA colonization surveillance program. It is not intended to diagnose MRSA infection nor to guide or monitor treatment for MRSA infections. Performed at [facility],  [address] [place], [place] [address]  Studies/Results: No results found.  Medications:  Prior to Admission:  Facility-Administered Medications Prior to Admission  Medication Dose Route Frequency Provider Last Rate Last Dose  . levalbuterol (XOPENEX) nebulizer solution 0.63 mg  0.63 mg Nebulization Once Magdalen Spatz, NP       Medications Prior to Admission  Medication Sig Dispense Refill Last Dose  . albuterol (PROVENTIL HFA;VENTOLIN HFA) 108 (90 Base) MCG/ACT inhaler Inhale 2 puffs into the lungs every 6 (six) hours as needed for wheezing or shortness of breath. 1 Inhaler 5 05/25/2017 at Unknown time  . amLODipine (NORVASC) 10 MG tablet Take 1 tablet (10 mg total) by mouth daily. 90 tablet 3 05/25/2017 at Unknown time  . diazepam (VALIUM) 10 MG tablet Take 0.5 tablets (5 mg total) by mouth every 8 (eight) hours as needed for anxiety. 30 tablet 0 05/24/2017 at Unknown time  . FLUoxetine (PROZAC) 40 MG capsule Take 40 mg by mouth daily.    05/25/2017 at Unknown time  . Fluticasone-Umeclidin-Vilant (TRELEGY ELLIPTA) 100-62.5-25 MCG/INH AEPB Inhale 1 Inhaler into the lungs daily. written by Dr. Luan Pulling   05/25/2017 at Unknown time  . gabapentin (NEURONTIN) 800 MG tablet Take 1 tablet (800 mg total) 4 (four) times daily by mouth. 120 tablet 0 05/25/2017 at Unknown time  . glipiZIDE (GLUCOTROL) 5 MG tablet TAKE 1 TABLET DAILY BEFORE BREAKFAST. 30 tablet 0 05/25/2017 at Unknown time  . metFORMIN (GLUCOPHAGE) 500 MG tablet Take 1 tablet (500 mg total) by mouth 2 (two) times daily with a meal. 60 tablet 3 05/25/2017 at Unknown time  . mirabegron ER (MYRBETRIQ) 50 MG TB24 tablet Take 1 tablet (50 mg total) by mouth daily. 30 tablet 11 05/25/2017 at Unknown time  . OXYGEN Inhale 2 L into the lungs daily as needed.   unknown at Unknown time  . SUBOXONE 8-2 MG FILM Take 1 Film by mouth 3 (three) times daily.  0 05/25/2017 at Unknown time  . ziprasidone (GEODON) 80 MG capsule Take 80 mg by mouth 2 (two) times  daily with a meal.    05/25/2017 at Unknown time  . Blood Glucose Monitoring Suppl (ONETOUCH VERIO) w/Device KIT 1 kit by Does not apply route 2 (two) times daily. 1 kit 0 Taking  . glucose blood (ONETOUCH VERIO) test strip Use as instructed 100 each 12 Taking  . Lancets (ONETOUCH ULTRASOFT) lancets Use as instructed 100 each 12 Taking   Scheduled: . acetylcysteine  3 mL Nebulization Q6H  . amLODipine  10 mg Oral Daily  . buprenorphine  8 mg Sublingual TID  . enoxaparin (LOVENOX) injection  40 mg Subcutaneous Q24H  . FLUoxetine  40 mg Oral Daily  . furosemide  40 mg Oral Daily  . gabapentin  800 mg Oral QID  . guaiFENesin  1,200 mg Oral BID  . insulin aspart  0-20 Units Subcutaneous TID WC  . insulin aspart  0-5 Units Subcutaneous QHS  . insulin aspart  14 Units Subcutaneous TID WC  . insulin glargine  50 Units Subcutaneous Daily  . ipratropium-albuterol  3 mL Nebulization Q6H  . mouth rinse  15 mL Mouth Rinse BID  . methylPREDNISolone (SOLU-MEDROL) injection  60 mg Intravenous Q12H  . mirabegron ER  50 mg Oral Daily  . potassium chloride  30 mEq Oral BID  . senna-docusate  2 tablet Oral QHS  . ziprasidone  80 mg Oral BID WC   Continuous:  [id number], albuterol, diazepam, LORazepam  Assesment: She was admitted with community-acquired pneumonia and acute  hypoxic respiratory failure.  She initially required BiPAP but now is on high flow Nasal cannula but only at 2 L.  She has COPD at baseline.  she also had acute diastolic heart failure which seems much better.  She says she feels like she is close to her baseline now. Principal Problem:   CAP (community acquired pneumonia) Active Problems:   Hypokalemia   Depression   Acute respiratory failure with hypoxia (HCC)   COPD with acute exacerbation (HCC)   Bipolar disorder (HCC)   Hyperlipidemia   Hypomagnesemia   BiPAP (biphasic positive airway pressure) dependence   Type 2 diabetes mellitus (HCC)   Acute diastolic CHF  (congestive heart failure) ([place])    Plan: I think okay to discontinue IV steroids and to discontinue Mucomyst at this point.  She does appear to be approaching her baseline.    LOS: 8 days   Keiden Deskin L 06/02/2017, 8:35 AM

## 2017-06-02 NOTE — Care Management Note (Signed)
Case Management Note  Patient Details  Name: Latasha [name] MRN: [id number] Date of Birth: 08/29/1975   If discussed at Long Length of Stay Meetings, dates discussed:  06/02/2017  Additional Comments:  Aggie Douse, Chrystine OilerSharley Diane, RN 06/02/2017, 12:08 PM

## 2017-06-02 NOTE — Progress Notes (Signed)
PROGRESS NOTE    Latasha [name]  [id number] DOB: 12/31/1975 DOA: 05/25/2017 PCP: Remus LofflerJones, Angel S, PA-C    Brief Narrative:  42 year old female with a history of COPD, bipolar disorder, presented to the hospital with worsening shortness of breath and respiratory failure.  Found to have community-acquired pneumonia/COPD exacerbation/CHF exacerbation and required BiPAP therapy.  She is currently on high flow nasal cannula and continues to be at high risk for decompensation.   Assessment & Plan:   Principal Problem:   CAP (community acquired pneumonia) Active Problems:   Hypokalemia   Depression   Acute respiratory failure with hypoxia (HCC)   COPD with acute exacerbation (HCC)   Bipolar disorder (HCC)   Hyperlipidemia   Hypomagnesemia   BiPAP (biphasic positive airway pressure) dependence   Type 2 diabetes mellitus (HCC)   Acute diastolic CHF (congestive heart failure) (HCC)   1. Acute respiratory failure with hypoxia.  Patient does not use any supplemental oxygen at home.  She has it ordered as needed, but normally does not require it.  On admission, she required BiPAP therapy.  She is currently on high flow nasal cannula at 20 L.  She is starting to feel better but still has a significant oxygen requirement. Will continue to wean as tolerated. 2. Community-acquired pneumonia.  X-ray shows bilateral pneumonia. She completed 4 days of azithromycin and 7 days of ceftriaxone. She will need repeat chest xray in 4 weeks. Continue pulmonary hygiene  3. COPD exacerbation.  Still has significant oxygen requirement. She has completed a course of antibiotics. Continue on neb treatments. She is on prednisone. Wean down oxygen as tolerated. 4. Acute diastolic congestive heart failure.  Initially treated with IV Lasix, now on oral Lasix.  Does not appear to be particularly volume overloaded at this time. bnp normal 5. Diabetes.  On basal/bolus regimen.  Blood sugar stable.  Continue to monitor  since she being restarted on steroids. 6. Bipolar disorder.  Continue on home psychotropic medications. 7. Chronic pain.  Continue on gabapentin and Suboxone. 8. Hypertension.  Currently on amlodipine.  Blood pressures currently stable.   DVT prophylaxis: Lovenox Code Status: Full code Family Communication: No family present Disposition Plan: Potentially discharge home once respiratory status has stabilized   Consultants:   Pulmonology  Procedures:  Echo: - Left ventricle: The cavity size was normal. Wall thickness was   normal. Systolic function was normal. The estimated ejection   fraction was in the range of 60% to 65%. Doppler parameters are   consistent with abnormal left ventricular relaxation (grade 1   diastolic dysfunction). - Aortic valve: Mildly calcified annulus. Trileaflet; normal   thickness leaflets. Valve area (VTI): 2.39 cm^2. Valve area    (Vmax): 2.36 cm^2. Valve area (Vmean): 2.23 cm^2.  Antimicrobials:   Azithromycin 3/20 rate >3/31  Ceftriaxone 3/27 >4/2   Subjective: She is starting to feel better. Has productive cough of white sputum. She is still on 20L oxygen  Objective: Vitals:   06/02/17 1000 06/02/17 1100 06/02/17 1300 06/02/17 1348  BP: 130/85 109/87 120/73   Pulse: 76 73 68   Resp: 14 17 12    Temp:      TempSrc:      SpO2: 90% 94% 92% 95%  Weight:      Height:        Intake/Output Summary (Last 24 hours) at 06/02/2017 1645 Last data filed at 06/02/2017 0500 Gross per 24 hour  Intake 550 ml  Output 925 ml  Net -375 ml  Filed Weights   05/31/17 0500 06/01/17 0500 06/02/17 0500  Weight: 97.6 kg (215 lb 2.7 oz) 97.2 kg (214 lb 4.6 oz) 97.8 kg (215 lb 9.8 oz)    Examination:  General exam: Alert, awake, oriented x 3 Respiratory system: bilateral wheezing, diminished breath sounds Cardiovascular system:RRR. No murmurs, rubs, gallops. Gastrointestinal system: Abdomen is nondistended, soft and nontender. No organomegaly or masses  felt. Normal bowel sounds heard. Central nervous system: Alert and oriented. No focal neurological deficits. Extremities: No C/C/E, +pedal pulses Skin: No rashes, lesions or ulcers Psychiatry: Judgement and insight appear normal. Mood & affect appropriate.   Data Reviewed: I have personally reviewed following labs and imaging studies  CBC: Recent Labs  Lab 05/28/17 0457 05/29/17 0602 05/30/17 0410  WBC 13.1* 9.1 13.0*  NEUTROABS  --  7.8*  --   HGB 12.8 13.3 13.7  HCT 40.2 41.0 41.9  MCV 89.7 88.2 87.3  PLT 202 208 217   Basic Metabolic Panel: Recent Labs  Lab 05/29/17 0602 05/30/17 0410 05/31/17 0410 06/01/17 0506 06/02/17 0526  NA 137 141 137 138 134*  K 3.6 4.1 4.0 3.8 4.6  CL 93* 94* 92* 92* 93*  CO2 28 28 30 31 31   GLUCOSE 307* 214* 244* 98 311*  BUN 19 24* 30* 24* 22*  CREATININE 0.63 0.68 0.71 0.77 0.66  CALCIUM 9.3 9.3 9.5 9.3 9.4  MG 2.0 2.1 2.1 2.2 2.1   GFR: Estimated Creatinine Clearance: 112.3 mL/min (by C-G formula based on SCr of 0.66 mg/dL). Liver Function Tests: Recent Labs  Lab 05/27/17 1304 05/28/17 0457 05/29/17 0602  AST 41 30 20  ALT 30 24 23   ALKPHOS 100 93 106  BILITOT 0.6 0.4 0.6  PROT 7.4 7.1 7.5  ALBUMIN 3.4* 3.2* 3.3*   No results for input(s): LIPASE, AMYLASE in the last 168 hours. No results for input(s): AMMONIA in the last 168 hours. Coagulation Profile: No results for input(s): INR, PROTIME in the last 168 hours. Cardiac Enzymes: No results for input(s): CKTOTAL, CKMB, CKMBINDEX, TROPONINI in the last 168 hours. BNP (last 3 results) No results for input(s): PROBNP in the last 8760 hours. HbA1C: No results for input(s): HGBA1C in the last 72 hours. CBG: Recent Labs  Lab 06/01/17 2142 06/02/17 0317 06/02/17 0801 06/02/17 1150 06/02/17 1643  GLUCAP 255* 259* 292* 109* 93   Lipid Profile: No results for input(s): CHOL, HDL, LDLCALC, TRIG, CHOLHDL, LDLDIRECT in the last 72 hours. Thyroid Function Tests: No  results for input(s): TSH, T4TOTAL, FREET4, T3FREE, THYROIDAB in the last 72 hours. Anemia Panel: No results for input(s): VITAMINB12, FOLATE, FERRITIN, TIBC, IRON, RETICCTPCT in the last 72 hours. Sepsis Labs: No results for input(s): PROCALCITON, LATICACIDVEN in the last 168 hours.  Recent Results (from the past 240 hour(s))  MRSA PCR Screening     Status: None   Collection Time: 05/26/17  2:31 PM  Result Value Ref Range Status   MRSA by PCR NEGATIVE NEGATIVE Final    Comment:        The GeneXpert MRSA Assay (FDA approved for NASAL specimens only), is one component of a comprehensive MRSA colonization surveillance program. It is not intended to diagnose MRSA infection nor to guide or monitor treatment for MRSA infections. Performed at [facility], [address] [place], [place] [address]          Radiology Studies: No results found.      Scheduled Meds: . amLODipine  10 mg Oral Daily  . buprenorphine  8 mg Sublingual TID  . enoxaparin (LOVENOX) injection  40 mg Subcutaneous Q24H  . FLUoxetine  40 mg Oral Daily  . furosemide  40 mg Oral Daily  . gabapentin  800 mg Oral QID  . guaiFENesin  1,200 mg Oral BID  . insulin aspart  0-20 Units Subcutaneous TID WC  . insulin aspart  0-5 Units Subcutaneous QHS  . insulin aspart  14 Units Subcutaneous TID WC  . insulin glargine  50 Units Subcutaneous Daily  . ipratropium-albuterol  3 mL Nebulization Q6H  . mouth rinse  15 mL Mouth Rinse BID  . mirabegron ER  50 mg Oral Daily  . potassium chloride  30 mEq Oral BID  . predniSONE  40 mg Oral Q breakfast  . senna-docusate  2 tablet Oral QHS  . ziprasidone  80 mg Oral BID WC   Continuous Infusions:    LOS: 8 days    Time spent:    Erick Blinks, MD [facility] Pager [phone]  If 7PM-7AM, please contact night-coverage [email or web address] Password [facility] 06/02/2017, 4:45 PM

## 2017-06-02 NOTE — Progress Notes (Signed)
Inpatient Diabetes Program Recommendations  AACE/ADA: New Consensus Statement on Inpatient Glycemic Control (2015)  Target Ranges:  Prepandial:   less than 140 mg/dL      Peak postprandial:   less than 180 mg/dL (1-2 hours)      Critically ill patients:  140 - 180 mg/dL  Results for Latasha [name], Latasha [name] (MRN [id number]) as of 06/02/2017 07:20  Ref. Range 06/01/2017 08:59 06/01/2017 11:03 06/01/2017 17:01 06/01/2017 21:42 06/02/2017 03:17  Glucose-Capillary Latest Ref Range: 65 - 99 mg/dL 213 (H) 150 (H) 232 (H) 255 (H) 259 (H)   Review of Glycemic Control  Diabetes history: DM2 Outpatient Diabetes medications: Metformin 500 mg BID, Glipizide 5 mg daily Current orders for Inpatient glycemic control: Lantus 50 units daily, Novolog 14 units TID with meals, Novolog 0-20 units TID with meals, Novolog 0-5 units QHS Current orders for Inpatient glycemic control: Lantus 50 units daily, Novolog 0-20 units with meals, Novolog 0-5 units QHS, Novolog 14 units TID with meals; Solumedrol 60 mg Q12H  Inpatient Diabetes Program Recommendations:  Insulin - Basal: If steroids are continued as ordered, please consider increasing Lantus to 55 units dialy. Insulin - Meal Coverage: In reviewing chart, noted patient did NOT receive any meal coverage insulin on 06/01/17 due to eating less than 50%. NURSING: please be sure to administer Novolog meal coverage if parameters are met. HgbA1C: A1C 9.9% on 05/25/2017 indicating an average glucose of 237 mg/dl over the past 2-3 months. Patient reports she has been consistently taking Metformin and Glipizide and glucose has been 250 mg/dl or higher. Patient will likely need to be discharged new to insulin. If so, patient prefers insulin pens.  Thanks, Barnie Alderman, RN, MSN, CDE Diabetes Coordinator Inpatient Diabetes Program [phone] (Team Pager from 8am to 5pm)

## 2017-06-02 NOTE — Patient Outreach (Signed)
[facility] Care Management  06/02/2017  Latasha [name] 09/22/1975 [id number]  Transition of care  Referral date: 06/01/17 Referral source: hospital liaison referral Insurance: [employer]  Telephone call to patient regarding transition of care referral. Unable to reach. HIPAA compliant voice message left with call back phone number.   ASSESSMENT; Per chart review.  Patient still in hospital.     PLAN: RNCM will continue to follow for hospital discharge disposition  Latasha InaDavina Shanekqua Schaper RN,BSN,CCM [facility] Telephonic  [phone]

## 2017-06-02 NOTE — Progress Notes (Signed)
Spoke with patient this morning regarding diet and foods that were better to choose when living with diabetes. We talked in detail about why her blood sugar was higher last night and this morning than it had been during the day yesterday. I asked her about the Nabs beside her bed and the vienna sausages that she ate the night before. We talked about the carbohydrates and the ways that she can make better choices if she feels that she needs a snack. We discussed choosing nuts, cheese, popcorn over nabs and vienna sausages.   I also explained to the patient our policy on outside food or drink and that we were not supposed to allow visitors to bring food from outside. I asked her to inform her visitors of this policy as they were not in the room during our discussion.   This afternoon I came in to the patient's room to give her her medications and she was very upset with me. She told me that she felt threatened that I was not letting her family come visit and that she did not know that the snacks she was eating were not good for her and her diabetes. Beside her bed was the living well with diabetes book and we discussed the information in there at length. I apologized that she misinterpreted the information we spoke about this morning and told her that her family was fine to visit but that we just did not want any outside food brought into the ICU. I also pulled this policy out of patient's welcome packet that was beside her bed to show her. Patient verbalized understanding.

## 2017-06-03 LAB — BASIC METABOLIC PANEL
ANION GAP: 13 (ref 5–15)
BUN: 24 mg/dL — ABNORMAL HIGH (ref 6–20)
CALCIUM: 9.7 mg/dL (ref 8.9–10.3)
CHLORIDE: 93 mmol/L — AB (ref 101–111)
CO2: 32 mmol/L (ref 22–32)
Creatinine, Ser: 0.66 mg/dL (ref 0.44–1.00)
GFR calc non Af Amer: >60 mL/min (ref >60)
GLUCOSE: 121 mg/dL — AB (ref 65–99)
POTASSIUM: 4.2 mmol/L (ref 3.5–5.1)
Sodium: 138 mmol/L (ref 135–145)

## 2017-06-03 LAB — GLUCOSE, CAPILLARY
GLUCOSE-CAPILLARY: 127 mg/dL — AB (ref 65–99)
GLUCOSE-CAPILLARY: 129 mg/dL — AB (ref 65–99)
GLUCOSE-CAPILLARY: 99 mg/dL (ref 65–99)
GLUCOSE-CAPILLARY: 132 mg/dL — AB (ref 65–99)
Glucose-Capillary: 121 mg/dL — ABNORMAL HIGH (ref 65–99)

## 2017-06-03 NOTE — Progress Notes (Signed)
PROGRESS NOTE    Latasha [name]  ZOX:[id number] DOB: 02/13/1976 DOA: 05/25/2017 PCP: Remus LofflerJones, Angel S, PA-C    Brief Narrative:  42 year old female with a history of COPD, bipolar disorder, presented to the hospital with worsening shortness of breath and respiratory failure.  Found to have community-acquired pneumonia/COPD exacerbation/CHF exacerbation and required BiPAP therapy.  She is currently on high flow nasal cannula and continues to be at high risk for decompensation.   Assessment & Plan:   Principal Problem:   CAP (community acquired pneumonia) Active Problems:   Hypokalemia   Depression   Acute respiratory failure with hypoxia (HCC)   COPD with acute exacerbation (HCC)   Bipolar disorder (HCC)   Hyperlipidemia   Hypomagnesemia   BiPAP (biphasic positive airway pressure) dependence   Type 2 diabetes mellitus (HCC)   Acute diastolic CHF (congestive heart failure) (HCC)   1. Acute respiratory failure with hypoxia.  She is chronically on oxygen at home.  Initially required BiPAP therapy but now on high flow nasal cannula.  Still on 20 L of oxygen.  Wean off as tolerated 2. Community-acquired pneumonia.  X-ray shows bilateral pneumonia. She completed 4 days of azithromycin and 7 days of ceftriaxone. She will need repeat chest xray in 4 weeks. Continue pulmonary hygiene  3. COPD exacerbation.  Still has significant oxygen requirement. She has completed a course of antibiotics. Continue on neb treatments. She is on prednisone. Wean down oxygen as tolerated. 4. Acute diastolic congestive heart failure.  Initially treated with IV Lasix, now on oral Lasix.  Does not appear to be particularly volume overloaded at this time. bnp normal 5. Diabetes.  On basal/bolus regimen.  Blood sugar stable.  Continue to monitor since she is on steroids. 6. Bipolar disorder.  Continue on home psychotropic medications. 7. Chronic pain.  Continue on gabapentin and Suboxone. 8. Hypertension.  Currently  on amlodipine.  Blood pressures currently stable.   DVT prophylaxis: Lovenox Code Status: Full code Family Communication: No family present Disposition Plan: Potentially discharge home once respiratory status has stabilized   Consultants:   Pulmonology  Procedures:  Echo: - Left ventricle: The cavity size was normal. Wall thickness was   normal. Systolic function was normal. The estimated ejection   fraction was in the range of 60% to 65%. Doppler parameters are   consistent with abnormal left ventricular relaxation (grade 1   diastolic dysfunction). - Aortic valve: Mildly calcified annulus. Trileaflet; normal   thickness leaflets. Valve area (VTI): 2.39 cm^2. Valve area    (Vmax): 2.36 cm^2. Valve area (Vmean): 2.23 cm^2.  Antimicrobials:   Azithromycin 3/20 rate >3/31  Ceftriaxone 3/27 >4/2   Subjective: Continues to have productive cough.  Shortness of breath improving.  Still on 20 L of oxygen  Objective: Vitals:   06/03/17 1500 06/03/17 1600 06/03/17 1626 06/03/17 1700  BP: 109/77 111/66  116/77  Pulse: 66 73 63 64  Resp: 13 13 10 17   Temp:   98.4 F (36.9 C)   TempSrc:   Oral   SpO2: 92% 91% 96% 91%  Weight:      Height:        Intake/Output Summary (Last 24 hours) at 06/03/2017 1854 Last data filed at 06/03/2017 1500 Gross per 24 hour  Intake 400 ml  Output 2800 ml  Net -2400 ml   Filed Weights   06/01/17 0500 06/02/17 0500 06/03/17 0500  Weight: 97.2 kg (214 lb 4.6 oz) 97.8 kg (215 lb 9.8 oz) 98.1 kg (216  lb 4.3 oz)    Examination:  General exam: Alert, awake, oriented x 3 Respiratory system: Normal respiratory effort.  Mild wheeze bilaterally Cardiovascular system:RRR. No murmurs, rubs, gallops. Gastrointestinal system: Abdomen is nondistended, soft and nontender. No organomegaly or masses felt. Normal bowel sounds heard. Central nervous system: Alert and oriented. No focal neurological deficits. Extremities: No C/C/E, +pedal pulses Skin: No  rashes, lesions or ulcers Psychiatry: Judgement and insight appear normal. Mood & affect appropriate.    Data Reviewed: I have personally reviewed following labs and imaging studies  CBC: Recent Labs  Lab 05/28/17 0457 05/29/17 0602 05/30/17 0410  WBC 13.1* 9.1 13.0*  NEUTROABS  --  7.8*  --   HGB 12.8 13.3 13.7  HCT 40.2 41.0 41.9  MCV 89.7 88.2 87.3  PLT 202 208 217   Basic Metabolic Panel: Recent Labs  Lab 05/29/17 0602 05/30/17 0410 05/31/17 0410 06/01/17 0506 06/02/17 0526 06/03/17 0414  NA 137 141 137 138 134* 138  K 3.6 4.1 4.0 3.8 4.6 4.2  CL 93* 94* 92* 92* 93* 93*  CO2 28 28 30 31 31  32  GLUCOSE 307* 214* 244* 98 311* 121*  BUN 19 24* 30* 24* 22* 24*  CREATININE 0.63 0.68 0.71 0.77 0.66 0.66  CALCIUM 9.3 9.3 9.5 9.3 9.4 9.7  MG 2.0 2.1 2.1 2.2 2.1  --    GFR: Estimated Creatinine Clearance: 112.4 mL/min (by C-G formula based on SCr of 0.66 mg/dL). Liver Function Tests: Recent Labs  Lab 05/28/17 0457 05/29/17 0602  AST 30 20  ALT 24 23  ALKPHOS 93 106  BILITOT 0.4 0.6  PROT 7.1 7.5  ALBUMIN 3.2* 3.3*   No results for input(s): LIPASE, AMYLASE in the last 168 hours. No results for input(s): AMMONIA in the last 168 hours. Coagulation Profile: No results for input(s): INR, PROTIME in the last 168 hours. Cardiac Enzymes: No results for input(s): CKTOTAL, CKMB, CKMBINDEX, TROPONINI in the last 168 hours. BNP (last 3 results) No results for input(s): PROBNP in the last 8760 hours. HbA1C: No results for input(s): HGBA1C in the last 72 hours. CBG: Recent Labs  Lab 06/02/17 2218 06/03/17 0414 06/03/17 0739 06/03/17 1113 06/03/17 1622  GLUCAP 106* 121* 127* 129* 99   Lipid Profile: No results for input(s): CHOL, HDL, LDLCALC, TRIG, CHOLHDL, LDLDIRECT in the last 72 hours. Thyroid Function Tests: No results for input(s): TSH, T4TOTAL, FREET4, T3FREE, THYROIDAB in the last 72 hours. Anemia Panel: No results for input(s): VITAMINB12, FOLATE,  FERRITIN, TIBC, IRON, RETICCTPCT in the last 72 hours. Sepsis Labs: No results for input(s): PROCALCITON, LATICACIDVEN in the last 168 hours.  Recent Results (from the past 240 hour(s))  MRSA PCR Screening     Status: None   Collection Time: 05/26/17  2:31 PM  Result Value Ref Range Status   MRSA by PCR NEGATIVE NEGATIVE Final    Comment:        The GeneXpert MRSA Assay (FDA approved for NASAL specimens only), is one component of a comprehensive MRSA colonization surveillance program. It is not intended to diagnose MRSA infection nor to guide or monitor treatment for MRSA infections. Performed at [facility], [address] [place], [place] [address]          Radiology Studies: No results found.      Scheduled Meds: . amLODipine  10 mg Oral Daily  . buprenorphine  8 mg Sublingual TID  . enoxaparin (LOVENOX) injection  40 mg Subcutaneous Q24H  . FLUoxetine  40 mg Oral Daily  . furosemide  40 mg Oral Daily  . gabapentin  800 mg Oral QID  . guaiFENesin  1,200 mg Oral BID  . insulin aspart  0-20 Units Subcutaneous TID WC  . insulin aspart  0-5 Units Subcutaneous QHS  . insulin aspart  14 Units Subcutaneous TID WC  . insulin glargine  50 Units Subcutaneous Daily  . ipratropium-albuterol  3 mL Nebulization Q6H  . mouth rinse  15 mL Mouth Rinse BID  . mirabegron ER  50 mg Oral Daily  . potassium chloride  30 mEq Oral BID  . predniSONE  40 mg Oral Q breakfast  . senna-docusate  2 tablet Oral QHS  . ziprasidone  80 mg Oral BID WC   Continuous Infusions:    LOS: 9 days    Time spent:    Erick Blinks, MD [facility] Pager [phone]  If 7PM-7AM, please contact night-coverage [email or web address] Password [facility] 06/03/2017, 6:54 PM

## 2017-06-03 NOTE — Progress Notes (Signed)
Subjective: She feels okay.  No new complaints.  I misread the flow meter yesterday and she was on 25 L not 2.5.  She is now on 20 L and 70% oxygen so still past the level that we could manage at home.  She has no other complaints and says her breathing feels okay  Objective: Vital signs in last 24 hours: Temp:  [97.6 F (36.4 C)-98.1 F (36.7 C)] 98.1 F (36.7 C) (04/03 0741) Pulse Rate:  [52-124] 69 (04/03 0741) Resp:  [11-24] 16 (04/03 0741) BP: (96-132)/(65-87) 115/78 (04/03 0600) SpO2:  [89 %-99 %] 93 % (04/03 0741) FiO2 (%):  [60 %-80 %] 60 % (04/03 0744) Weight:  [98.1 kg (216 lb 4.3 oz)] 98.1 kg (216 lb 4.3 oz) (04/03 0500) Weight change: 0.3 kg (10.6 oz) Last BM Date: 05/30/17(taking stool softener)  Intake/Output from previous day: 04/02 0701 - 04/03 0700 In: 400 [P.O.:400] Out: 1300 [Urine:1300]  PHYSICAL EXAM General appearance: alert, cooperative and no distress Resp: She still has rhonchi bilaterally Cardio: regular rate and rhythm, S1, S2 normal, no murmur, click, rub or gallop GI: soft, non-tender; bowel sounds normal; no masses,  no organomegaly Extremities: extremities normal, atraumatic, no cyanosis or edema  Lab Results:  Results for orders placed or performed during the hospital encounter of 05/25/17 (from the past 48 hour(s))  Glucose, capillary     Status: Abnormal   Collection Time: 06/01/17  8:59 AM  Result Value Ref Range   Glucose-Capillary 213 (H) 65 - 99 mg/dL  Glucose, capillary     Status: Abnormal   Collection Time: 06/01/17 11:03 AM  Result Value Ref Range   Glucose-Capillary 150 (H) 65 - 99 mg/dL  Brain natriuretic peptide     Status: None   Collection Time: 06/01/17  1:05 PM  Result Value Ref Range   B Natriuretic Peptide 6.0 0.0 - 100.0 pg/mL    Comment: Performed at [facility], [address], [place], [place] [address]  Glucose, capillary     Status: Abnormal   Collection Time: 06/01/17  5:01 PM  Result Value Ref Range    Glucose-Capillary 232 (H) 65 - 99 mg/dL  Glucose, capillary     Status: Abnormal   Collection Time: 06/01/17  9:42 PM  Result Value Ref Range   Glucose-Capillary 255 (H) 65 - 99 mg/dL  Glucose, capillary     Status: Abnormal   Collection Time: 06/02/17  3:17 AM  Result Value Ref Range   Glucose-Capillary 259 (H) 65 - 99 mg/dL  Basic metabolic panel     Status: Abnormal   Collection Time: 06/02/17  5:26 AM  Result Value Ref Range   Sodium 134 (L) 135 - 145 mmol/L   Potassium 4.6 3.5 - 5.1 mmol/L    Comment: DELTA CHECK NOTED   Chloride 93 (L) 101 - 111 mmol/L   CO2 31 22 - 32 mmol/L   Glucose, Bld 311 (H) 65 - 99 mg/dL   BUN 22 (H) 6 - 20 mg/dL   Creatinine, Ser 0.66 0.44 - 1.00 mg/dL   Calcium 9.4 8.9 - 10.3 mg/dL   GFR calc non Af Amer >60 >60 mL/min   GFR calc Af Amer >60 >60 mL/min    Comment: (NOTE) The eGFR has been calculated using the CKD EPI equation. This calculation has not been validated in all clinical situations. eGFR's persistently <60 mL/min signify possible Chronic Kidney Disease.    Anion gap 10 5 - 15    Comment: Performed  at [facility], [address] [place], [place] [address]  Magnesium     Status: None   Collection Time: 06/02/17  5:26 AM  Result Value Ref Range   Magnesium 2.1 1.7 - 2.4 mg/dL    Comment: Performed at [facility], [address] [place], [place] [address]  Glucose, capillary     Status: Abnormal   Collection Time: 06/02/17  8:01 AM  Result Value Ref Range   Glucose-Capillary 292 (H) 65 - 99 mg/dL  Glucose, capillary     Status: Abnormal   Collection Time: 06/02/17 11:50 AM  Result Value Ref Range   Glucose-Capillary 109 (H) 65 - 99 mg/dL  Glucose, capillary     Status: None   Collection Time: 06/02/17  4:43 PM  Result Value Ref Range   Glucose-Capillary 93 65 - 99 mg/dL  Glucose, capillary     Status: Abnormal   Collection Time: 06/02/17 10:18 PM  Result Value Ref Range   Glucose-Capillary 106 (H) 65 - 99 mg/dL  Basic  metabolic panel     Status: Abnormal   Collection Time: 06/03/17  4:14 AM  Result Value Ref Range   Sodium 138 135 - 145 mmol/L   Potassium 4.2 3.5 - 5.1 mmol/L   Chloride 93 (L) 101 - 111 mmol/L   CO2 32 22 - 32 mmol/L   Glucose, Bld 121 (H) 65 - 99 mg/dL   BUN 24 (H) 6 - 20 mg/dL   Creatinine, Ser 0.66 0.44 - 1.00 mg/dL   Calcium 9.7 8.9 - 10.3 mg/dL   GFR calc non Af Amer >60 >60 mL/min   GFR calc Af Amer >60 >60 mL/min    Comment: (NOTE) The eGFR has been calculated using the CKD EPI equation. This calculation has not been validated in all clinical situations. eGFR's persistently <60 mL/min signify possible Chronic Kidney Disease.    Anion gap 13 5 - 15    Comment: Performed at [facility], [address], [place], [place] [address]  Glucose, capillary     Status: Abnormal   Collection Time: 06/03/17  4:14 AM  Result Value Ref Range   Glucose-Capillary 121 (H) 65 - 99 mg/dL  Glucose, capillary     Status: Abnormal   Collection Time: 06/03/17  7:39 AM  Result Value Ref Range   Glucose-Capillary 127 (H) 65 - 99 mg/dL    ABGS No results for input(s): PHART, PO2ART, TCO2, HCO3 in the last 72 hours.  Invalid input(s): PCO2 CULTURES Recent Results (from the past 240 hour(s))  MRSA PCR Screening     Status: None   Collection Time: 05/26/17  2:31 PM  Result Value Ref Range Status   MRSA by PCR NEGATIVE NEGATIVE Final    Comment:        The GeneXpert MRSA Assay (FDA approved for NASAL specimens only), is one component of a comprehensive MRSA colonization surveillance program. It is not intended to diagnose MRSA infection nor to guide or monitor treatment for MRSA infections. Performed at [facility], [address] [place], [place] [address]    Studies/Results: No results found.  Medications:  Prior to Admission:  Facility-Administered Medications Prior to Admission  Medication Dose Route Frequency Provider Last Rate Last Dose  . levalbuterol (XOPENEX)  nebulizer solution 0.63 mg  0.63 mg Nebulization Once Magdalen Spatz, NP       Medications Prior to Admission  Medication Sig Dispense Refill Last Dose  . albuterol (PROVENTIL HFA;VENTOLIN HFA) 108 (90 Base)  MCG/ACT inhaler Inhale 2 puffs into the lungs every 6 (six) hours as needed for wheezing or shortness of breath. 1 Inhaler 5 05/25/2017 at Unknown time  . amLODipine (NORVASC) 10 MG tablet Take 1 tablet (10 mg total) by mouth daily. 90 tablet 3 05/25/2017 at Unknown time  . diazepam (VALIUM) 10 MG tablet Take 0.5 tablets (5 mg total) by mouth every 8 (eight) hours as needed for anxiety. 30 tablet 0 05/24/2017 at Unknown time  . FLUoxetine (PROZAC) 40 MG capsule Take 40 mg by mouth daily.    05/25/2017 at Unknown time  . Fluticasone-Umeclidin-Vilant (TRELEGY ELLIPTA) 100-62.5-25 MCG/INH AEPB Inhale 1 Inhaler into the lungs daily. written by Dr. Luan Pulling   05/25/2017 at Unknown time  . gabapentin (NEURONTIN) 800 MG tablet Take 1 tablet (800 mg total) 4 (four) times daily by mouth. 120 tablet 0 05/25/2017 at Unknown time  . glipiZIDE (GLUCOTROL) 5 MG tablet TAKE 1 TABLET DAILY BEFORE BREAKFAST. 30 tablet 0 05/25/2017 at Unknown time  . metFORMIN (GLUCOPHAGE) 500 MG tablet Take 1 tablet (500 mg total) by mouth 2 (two) times daily with a meal. 60 tablet 3 05/25/2017 at Unknown time  . mirabegron ER (MYRBETRIQ) 50 MG TB24 tablet Take 1 tablet (50 mg total) by mouth daily. 30 tablet 11 05/25/2017 at Unknown time  . OXYGEN Inhale 2 L into the lungs daily as needed.   unknown at Unknown time  . SUBOXONE 8-2 MG FILM Take 1 Film by mouth 3 (three) times daily.  0 05/25/2017 at Unknown time  . ziprasidone (GEODON) 80 MG capsule Take 80 mg by mouth 2 (two) times daily with a meal.    05/25/2017 at Unknown time  . Blood Glucose Monitoring Suppl (ONETOUCH VERIO) w/Device KIT 1 kit by Does not apply route 2 (two) times daily. 1 kit 0 Taking  . glucose blood (ONETOUCH VERIO) test strip Use as instructed 100 each 12 Taking   . Lancets (ONETOUCH ULTRASOFT) lancets Use as instructed 100 each 12 Taking   Scheduled: . amLODipine  10 mg Oral Daily  . buprenorphine  8 mg Sublingual TID  . enoxaparin (LOVENOX) injection  40 mg Subcutaneous Q24H  . FLUoxetine  40 mg Oral Daily  . furosemide  40 mg Oral Daily  . gabapentin  800 mg Oral QID  . guaiFENesin  1,200 mg Oral BID  . insulin aspart  0-20 Units Subcutaneous TID WC  . insulin aspart  0-5 Units Subcutaneous QHS  . insulin aspart  14 Units Subcutaneous TID WC  . insulin glargine  50 Units Subcutaneous Daily  . ipratropium-albuterol  3 mL Nebulization Q6H  . mouth rinse  15 mL Mouth Rinse BID  . mirabegron ER  50 mg Oral Daily  . potassium chloride  30 mEq Oral BID  . predniSONE  40 mg Oral Q breakfast  . senna-docusate  2 tablet Oral QHS  . ziprasidone  80 mg Oral BID WC   Continuous:  AGT:[id number], albuterol, diazepam, LORazepam  Assesment: She was admitted with community-acquired pneumonia acute hypoxic respiratory failure and COPD exacerbation.  She also had acute diastolic heart failure related to fluid resuscitation.  She is improving but still requiring high flow oxygen.  Her situation is complicated by bipolar disease but that seems much improved from several days ago. Principal Problem:   CAP (community acquired pneumonia) Active Problems:   Hypokalemia   Depression   Acute respiratory failure with hypoxia (HCC)   COPD with acute exacerbation (HCC)   Bipolar  disorder ([place])   Hyperlipidemia   Hypomagnesemia   BiPAP (biphasic positive airway pressure) dependence   Type 2 diabetes mellitus (HCC)   Acute diastolic CHF (congestive heart failure) ([place])    Plan: Continue working at trying to reduce her oxygen requirement.  Add incentive spirometry    LOS: 9 days   Ercelle Winkles L 06/03/2017, 8:15 AM

## 2017-06-04 LAB — GLUCOSE, CAPILLARY
GLUCOSE-CAPILLARY: 96 mg/dL (ref 65–99)
GLUCOSE-CAPILLARY: 145 mg/dL — AB (ref 65–99)
GLUCOSE-CAPILLARY: 113 mg/dL — AB (ref 65–99)
Glucose-Capillary: 104 mg/dL — ABNORMAL HIGH (ref 65–99)
Glucose-Capillary: 285 mg/dL — ABNORMAL HIGH (ref 65–99)

## 2017-06-04 MED ORDER — IPRATROPIUM-ALBUTEROL 0.5-2.5 (3) MG/3ML IN SOLN
3.0000 mL | Freq: Three times a day (TID) | RESPIRATORY_TRACT | Status: DC
  Administered 2017-06-05 (×2): 3 mL via RESPIRATORY_TRACT
  Filled 2017-06-04 (×2): qty 3

## 2017-06-04 NOTE — Progress Notes (Signed)
PROGRESS NOTE    Latasha [name]  ZOX:[id number] DOB: 1975-09-09 DOA: 05/25/2017 PCP: Remus Loffler, PA-C    Brief Narrative:  42 year old female with a history of COPD, bipolar disorder, presented to the hospital with worsening shortness of breath and respiratory failure.  Found to have community-acquired pneumonia/COPD exacerbation/CHF exacerbation and required BiPAP therapy.  She is currently on high flow nasal cannula and continues to be at high risk for decompensation.   Assessment & Plan:   Principal Problem:   CAP (community acquired pneumonia) Active Problems:   Hypokalemia   Depression   Acute respiratory failure with hypoxia (HCC)   COPD with acute exacerbation (HCC)   Bipolar disorder (HCC)   Hyperlipidemia   Hypomagnesemia   BiPAP (biphasic positive airway pressure) dependence   Type 2 diabetes mellitus (HCC)   Acute diastolic CHF (congestive heart failure) (HCC)   1. Acute respiratory failure with hypoxia.  She is chronically on oxygen at home.  Initially required BiPAP therapy but now on high flow nasal cannula.  She has been weaned down to 15L oxygen. Continue to wean down as tolerated 2. Community-acquired pneumonia.  X-ray shows bilateral pneumonia. She completed 4 days of azithromycin and 7 days of ceftriaxone. She will need repeat chest xray in 4 weeks. Continue pulmonary hygiene  3. COPD exacerbation.  Still has significant oxygen requirement. She has completed a course of antibiotics. Continue on neb treatments. She is on prednisone. Wean down oxygen as tolerated. 4. Acute diastolic congestive heart failure.  Initially treated with IV Lasix, now on oral Lasix.  Does not appear to be particularly volume overloaded at this time. bnp normal 5. Diabetes.  On basal/bolus regimen.  Blood sugar stable.  Continue to monitor since she is on steroids. 6. Bipolar disorder.  Continue on home psychotropic medications. 7. Chronic pain.  Continue on gabapentin and  Suboxone. 8. Hypertension.  Currently on amlodipine.  Blood pressures currently stable.   DVT prophylaxis: Lovenox Code Status: Full code Family Communication: No family present Disposition Plan: Potentially discharge home once respiratory status has stabilized   Consultants:   Pulmonology  Procedures:  Echo: - Left ventricle: The cavity size was normal. Wall thickness was   normal. Systolic function was normal. The estimated ejection   fraction was in the range of 60% to 65%. Doppler parameters are   consistent with abnormal left ventricular relaxation (grade 1   diastolic dysfunction). - Aortic valve: Mildly calcified annulus. Trileaflet; normal   thickness leaflets. Valve area (VTI): 2.39 cm^2. Valve area    (Vmax): 2.36 cm^2. Valve area (Vmean): 2.23 cm^2.  Antimicrobials:   Azithromycin 3/20 rate >3/31  Ceftriaxone 3/27 >4/2   Subjective: Feeling better. Denies shortness of breath. Continues to have productive cough. She has been weaned down to 15L oxygen  Objective: Vitals:   06/04/17 0725 06/04/17 1049 06/04/17 1500 06/04/17 1600  BP:   91/70 106/66  Pulse: 60 68 88 63  Resp: 11 12 19 18   Temp: 97.6 F (36.4 C) 98.3 F (36.8 C)    TempSrc: Oral Oral    SpO2: 95% 90% 95% 95%  Weight:      Height:        Intake/Output Summary (Last 24 hours) at 06/04/2017 1624 Last data filed at 06/04/2017 1347 Gross per 24 hour  Intake -  Output 3350 ml  Net -3350 ml   Filed Weights   06/02/17 0500 06/03/17 0500 06/04/17 0500  Weight: 97.8 kg (215 lb 9.8 oz) 98.1 kg (  216 lb 4.3 oz) 99.1 kg (218 lb 7.6 oz)    Examination:  General exam: Alert, awake, oriented x 3 Respiratory system: normal respiratory effort. Scattered rhonchi and mild wheeze bilaterally Cardiovascular system:RRR. No murmurs, rubs, gallops. Gastrointestinal system: Abdomen is nondistended, soft and nontender. No organomegaly or masses felt. Normal bowel sounds heard. Central nervous system: Alert  and oriented. No focal neurological deficits. Extremities: No C/C/E, +pedal pulses Skin: No rashes, lesions or ulcers Psychiatry: Judgement and insight appear normal. Mood & affect appropriate.   Data Reviewed: I have personally reviewed following labs and imaging studies  CBC: Recent Labs  Lab 05/29/17 0602 05/30/17 0410  WBC 9.1 13.0*  NEUTROABS 7.8*  --   HGB 13.3 13.7  HCT 41.0 41.9  MCV 88.2 87.3  PLT 208 217   Basic Metabolic Panel: Recent Labs  Lab 05/29/17 0602 05/30/17 0410 05/31/17 0410 06/01/17 0506 06/02/17 0526 06/03/17 0414  NA 137 141 137 138 134* 138  K 3.6 4.1 4.0 3.8 4.6 4.2  CL 93* 94* 92* 92* 93* 93*  CO2 28 28 30 31 31  32  GLUCOSE 307* 214* 244* 98 311* 121*  BUN 19 24* 30* 24* 22* 24*  CREATININE 0.63 0.68 0.71 0.77 0.66 0.66  CALCIUM 9.3 9.3 9.5 9.3 9.4 9.7  MG 2.0 2.1 2.1 2.2 2.1  --    GFR: Estimated Creatinine Clearance: 113 mL/min (by C-G formula based on SCr of 0.66 mg/dL). Liver Function Tests: Recent Labs  Lab 05/29/17 0602  AST 20  ALT 23  ALKPHOS 106  BILITOT 0.6  PROT 7.5  ALBUMIN 3.3*   No results for input(s): LIPASE, AMYLASE in the last 168 hours. No results for input(s): AMMONIA in the last 168 hours. Coagulation Profile: No results for input(s): INR, PROTIME in the last 168 hours. Cardiac Enzymes: No results for input(s): CKTOTAL, CKMB, CKMBINDEX, TROPONINI in the last 168 hours. BNP (last 3 results) No results for input(s): PROBNP in the last 8760 hours. HbA1C: No results for input(s): HGBA1C in the last 72 hours. CBG: Recent Labs  Lab 06/03/17 1622 06/03/17 2125 06/04/17 0313 06/04/17 0723 06/04/17 1048  GLUCAP 99 132* 104* 96 145*   Lipid Profile: No results for input(s): CHOL, HDL, LDLCALC, TRIG, CHOLHDL, LDLDIRECT in the last 72 hours. Thyroid Function Tests: No results for input(s): TSH, T4TOTAL, FREET4, T3FREE, THYROIDAB in the last 72 hours. Anemia Panel: No results for input(s): VITAMINB12,  FOLATE, FERRITIN, TIBC, IRON, RETICCTPCT in the last 72 hours. Sepsis Labs: No results for input(s): PROCALCITON, LATICACIDVEN in the last 168 hours.  Recent Results (from the past 240 hour(s))  MRSA PCR Screening     Status: None   Collection Time: 05/26/17  2:31 PM  Result Value Ref Range Status   MRSA by PCR NEGATIVE NEGATIVE Final    Comment:        The GeneXpert MRSA Assay (FDA approved for NASAL specimens only), is one component of a comprehensive MRSA colonization surveillance program. It is not intended to diagnose MRSA infection nor to guide or monitor treatment for MRSA infections. Performed at [facility], [address], [place], [place] [address]          Radiology Studies: No results found.      Scheduled Meds: . amLODipine  10 mg Oral Daily  . buprenorphine  8 mg Sublingual TID  . enoxaparin (LOVENOX) injection  40 mg Subcutaneous Q24H  . FLUoxetine  40 mg Oral Daily  . furosemide  40 mg  Oral Daily  . gabapentin  800 mg Oral QID  . guaiFENesin  1,200 mg Oral BID  . insulin aspart  0-20 Units Subcutaneous TID WC  . insulin aspart  0-5 Units Subcutaneous QHS  . insulin aspart  14 Units Subcutaneous TID WC  . insulin glargine  50 Units Subcutaneous Daily  . ipratropium-albuterol  3 mL Nebulization Q6H  . mouth rinse  15 mL Mouth Rinse BID  . mirabegron ER  50 mg Oral Daily  . potassium chloride  30 mEq Oral BID  . predniSONE  40 mg Oral Q breakfast  . senna-docusate  2 tablet Oral QHS  . ziprasidone  80 mg Oral BID WC   Continuous Infusions:    LOS: 10 days    Time spent: 25mins    Latasha Rokosz, MD [facility] Pager [phone]  If 7PM-7AM, please contact night-coverage [email or web address] Password [email or web address] 06/04/2017, 4:24 PM

## 2017-06-04 NOTE — Care Management Note (Signed)
Case Management Note  Patient Details  Name: Latasha [name] MRN: [id number] Date of Birth: 08/18/1975  If discussed at Long Length of Stay Meetings, dates discussed:  06/04/2017  Additional Comments:  Orell Hurtado, Chrystine OilerSharley Diane, RN 06/04/2017, 12:09 PM

## 2017-06-04 NOTE — Progress Notes (Signed)
Subjective: She says she feels better.  She is less short of breath.  She is still coughing.  No other new complaints  Objective: Vital signs in last 24 hours: Temp:  [97.6 F (36.4 C)-98.4 F (36.9 C)] 97.6 F (36.4 C) (04/04 0725) Pulse Rate:  [52-85] 60 (04/04 0725) Resp:  [10-26] 11 (04/04 0725) BP: (90-123)/(55-79) 110/70 (04/04 0600) SpO2:  [89 %-98 %] 95 % (04/04 0725) FiO2 (%):  [45 %-60 %] 45 % (04/04 0832) Weight:  [99.1 kg (218 lb 7.6 oz)] 99.1 kg (218 lb 7.6 oz) (04/04 0500) Weight change: 1 kg (2 lb 3.3 oz) Last BM Date: 06/02/17  Intake/Output from previous day: 04/03 0701 - 04/04 0700 In: -  Out: 3400 [Urine:3400]  PHYSICAL EXAM General appearance: alert, cooperative, no distress and morbidly obese Resp: She still has bilateral rhonchi posteriorly Cardio: regular rate and rhythm, S1, S2 normal, no murmur, click, rub or gallop GI: soft, non-tender; bowel sounds normal; no masses,  no organomegaly Extremities: extremities normal, atraumatic, no cyanosis or edema  Lab Results:  Results for orders placed or performed during the hospital encounter of 05/25/17 (from the past 48 hour(s))  Glucose, capillary     Status: Abnormal   Collection Time: 06/02/17 11:50 AM  Result Value Ref Range   Glucose-Capillary 109 (H) 65 - 99 mg/dL  Glucose, capillary     Status: None   Collection Time: 06/02/17  4:43 PM  Result Value Ref Range   Glucose-Capillary 93 65 - 99 mg/dL  Glucose, capillary     Status: Abnormal   Collection Time: 06/02/17 10:18 PM  Result Value Ref Range   Glucose-Capillary 106 (H) 65 - 99 mg/dL  Basic metabolic panel     Status: Abnormal   Collection Time: 06/03/17  4:14 AM  Result Value Ref Range   Sodium 138 135 - 145 mmol/L   Potassium 4.2 3.5 - 5.1 mmol/L   Chloride 93 (L) 101 - 111 mmol/L   CO2 32 22 - 32 mmol/L   Glucose, Bld 121 (H) 65 - 99 mg/dL   BUN 24 (H) 6 - 20 mg/dL   Creatinine, Ser 0.66 0.44 - 1.00 mg/dL   Calcium 9.7 8.9 - 10.3  mg/dL   GFR calc non Af Amer >60 >60 mL/min   GFR calc Af Amer >60 >60 mL/min    Comment: (NOTE) The eGFR has been calculated using the CKD EPI equation. This calculation has not been validated in all clinical situations. eGFR's persistently <60 mL/min signify possible Chronic Kidney Disease.    Anion gap 13 5 - 15    Comment: Performed at [facility], [address], [place], [place] [address]  Glucose, capillary     Status: Abnormal   Collection Time: 06/03/17  4:14 AM  Result Value Ref Range   Glucose-Capillary 121 (H) 65 - 99 mg/dL  Glucose, capillary     Status: Abnormal   Collection Time: 06/03/17  7:39 AM  Result Value Ref Range   Glucose-Capillary 127 (H) 65 - 99 mg/dL  Glucose, capillary     Status: Abnormal   Collection Time: 06/03/17 11:13 AM  Result Value Ref Range   Glucose-Capillary 129 (H) 65 - 99 mg/dL  Glucose, capillary     Status: None   Collection Time: 06/03/17  4:22 PM  Result Value Ref Range   Glucose-Capillary 99 65 - 99 mg/dL  Glucose, capillary     Status: Abnormal   Collection Time: 06/03/17  9:25 PM  Result Value Ref Range   Glucose-Capillary 132 (H) 65 - 99 mg/dL   Comment 1 Notify RN   Glucose, capillary     Status: Abnormal   Collection Time: 06/04/17  3:13 AM  Result Value Ref Range   Glucose-Capillary 104 (H) 65 - 99 mg/dL   Comment 1 Notify RN   Glucose, capillary     Status: None   Collection Time: 06/04/17  7:23 AM  Result Value Ref Range   Glucose-Capillary 96 65 - 99 mg/dL    ABGS No results for input(s): PHART, PO2ART, TCO2, HCO3 in the last 72 hours.  Invalid input(s): PCO2 CULTURES Recent Results (from the past 240 hour(s))  MRSA PCR Screening     Status: None   Collection Time: 05/26/17  2:31 PM  Result Value Ref Range Status   MRSA by PCR NEGATIVE NEGATIVE Final    Comment:        The GeneXpert MRSA Assay (FDA approved for NASAL specimens only), is one component of a comprehensive MRSA colonization surveillance  program. It is not intended to diagnose MRSA infection nor to guide or monitor treatment for MRSA infections. Performed at [facility], [address], [place], [place] [address]    Studies/Results: No results found.  Medications:  Prior to Admission:  Facility-Administered Medications Prior to Admission  Medication Dose Route Frequency Provider Last Rate Last Dose  . levalbuterol (XOPENEX) nebulizer solution 0.63 mg  0.63 mg Nebulization Once Magdalen Spatz, NP       Medications Prior to Admission  Medication Sig Dispense Refill Last Dose  . albuterol (PROVENTIL HFA;VENTOLIN HFA) 108 (90 Base) MCG/ACT inhaler Inhale 2 puffs into the lungs every 6 (six) hours as needed for wheezing or shortness of breath. 1 Inhaler 5 05/25/2017 at Unknown time  . amLODipine (NORVASC) 10 MG tablet Take 1 tablet (10 mg total) by mouth daily. 90 tablet 3 05/25/2017 at Unknown time  . diazepam (VALIUM) 10 MG tablet Take 0.5 tablets (5 mg total) by mouth every 8 (eight) hours as needed for anxiety. 30 tablet 0 05/24/2017 at Unknown time  . FLUoxetine (PROZAC) 40 MG capsule Take 40 mg by mouth daily.    05/25/2017 at Unknown time  . Fluticasone-Umeclidin-Vilant (TRELEGY ELLIPTA) 100-62.5-25 MCG/INH AEPB Inhale 1 Inhaler into the lungs daily. written by Dr. Luan Pulling   05/25/2017 at Unknown time  . gabapentin (NEURONTIN) 800 MG tablet Take 1 tablet (800 mg total) 4 (four) times daily by mouth. 120 tablet 0 05/25/2017 at Unknown time  . glipiZIDE (GLUCOTROL) 5 MG tablet TAKE 1 TABLET DAILY BEFORE BREAKFAST. 30 tablet 0 05/25/2017 at Unknown time  . metFORMIN (GLUCOPHAGE) 500 MG tablet Take 1 tablet (500 mg total) by mouth 2 (two) times daily with a meal. 60 tablet 3 05/25/2017 at Unknown time  . mirabegron ER (MYRBETRIQ) 50 MG TB24 tablet Take 1 tablet (50 mg total) by mouth daily. 30 tablet 11 05/25/2017 at Unknown time  . OXYGEN Inhale 2 L into the lungs daily as needed.   unknown at Unknown time  . SUBOXONE 8-2 MG FILM  Take 1 Film by mouth 3 (three) times daily.  0 05/25/2017 at Unknown time  . ziprasidone (GEODON) 80 MG capsule Take 80 mg by mouth 2 (two) times daily with a meal.    05/25/2017 at Unknown time  . Blood Glucose Monitoring Suppl (ONETOUCH VERIO) w/Device KIT 1 kit by Does not apply route 2 (two) times daily. 1 kit 0 Taking  . glucose  blood (ONETOUCH VERIO) test strip Use as instructed 100 each 12 Taking  . Lancets (ONETOUCH ULTRASOFT) lancets Use as instructed 100 each 12 Taking   Scheduled: . amLODipine  10 mg Oral Daily  . buprenorphine  8 mg Sublingual TID  . enoxaparin (LOVENOX) injection  40 mg Subcutaneous Q24H  . FLUoxetine  40 mg Oral Daily  . furosemide  40 mg Oral Daily  . gabapentin  800 mg Oral QID  . guaiFENesin  1,200 mg Oral BID  . insulin aspart  0-20 Units Subcutaneous TID WC  . insulin aspart  0-5 Units Subcutaneous QHS  . insulin aspart  14 Units Subcutaneous TID WC  . insulin glargine  50 Units Subcutaneous Daily  . ipratropium-albuterol  3 mL Nebulization Q6H  . mouth rinse  15 mL Mouth Rinse BID  . mirabegron ER  50 mg Oral Daily  . potassium chloride  30 mEq Oral BID  . predniSONE  40 mg Oral Q breakfast  . senna-docusate  2 tablet Oral QHS  . ziprasidone  80 mg Oral BID WC   Continuous:  [id number], albuterol, diazepam, LORazepam  Assesment: She was admitted with community-acquired pneumonia COPD exacerbation and acute hypoxic respiratory failure.  She has required BiPAP.  She is now on high flow nasal cannula at 15 L which is approximately a 55% FiO2.  She is at 100% on that.  She says she feels better.  She is less short of breath.  She is not coughing as much.  She had acute diastolic heart failure which is improving.  She has had some trouble with being anxious while she is in the hospital but that seems to be doing okay Principal Problem:   CAP (community acquired pneumonia) Active Problems:   Hypokalemia   Depression   Acute respiratory  failure with hypoxia ([place])   COPD with acute exacerbation ([place])   Bipolar disorder ([place])   Hyperlipidemia   Hypomagnesemia   BiPAP (biphasic positive airway pressure) dependence   Type 2 diabetes mellitus (H[facility])   Acute diastolic CHF (congestive heart failure) ([place])    Plan: Continue treatments.  Continue trying to reduce her FiO2.  Continue flutter valve nebulizer treatments and incentive spirometry    LOS: 10 days   Latasha [name] L 06/04/2017, 8:44 AM

## 2017-06-05 ENCOUNTER — Other Ambulatory Visit: Payer: Self-pay

## 2017-06-05 DIAGNOSIS — I5031 Acute diastolic (congestive) heart failure: Secondary | ICD-10-CM

## 2017-06-05 LAB — GLUCOSE, CAPILLARY
GLUCOSE-CAPILLARY: 122 mg/dL — AB (ref 65–99)
GLUCOSE-CAPILLARY: 90 mg/dL (ref 65–99)
Glucose-Capillary: 154 mg/dL — ABNORMAL HIGH (ref 65–99)

## 2017-06-05 MED ORDER — PREDNISONE 10 MG PO TABS: ORAL_TABLET | ORAL | 0 refills | Status: DC

## 2017-06-05 MED ORDER — POTASSIUM CHLORIDE CRYS ER 20 MEQ PO TBCR: 20.0000 meq | EXTENDED_RELEASE_TABLET | Freq: Every day | ORAL | 0 refills | Status: DC

## 2017-06-05 MED ORDER — INSULIN ASPART 100 UNIT/ML FLEXPEN: 10.0000 [IU] | PEN_INJECTOR | Freq: Three times a day (TID) | SUBCUTANEOUS | 11 refills | Status: DC

## 2017-06-05 MED ORDER — FUROSEMIDE 40 MG PO TABS: 40.0000 mg | ORAL_TABLET | Freq: Every day | ORAL | 0 refills | Status: DC

## 2017-06-05 MED ORDER — INSULIN PEN NEEDLE 31G X 5 MM MISC: 0 refills | Status: DC

## 2017-06-05 MED ORDER — GUAIFENESIN ER 600 MG PO TB12: 600.0000 mg | ORAL_TABLET | Freq: Two times a day (BID) | ORAL | 0 refills | Status: DC

## 2017-06-05 MED ORDER — INSULIN GLARGINE 100 UNIT/ML SOLOSTAR PEN: 40.0000 [IU] | PEN_INJECTOR | Freq: Every day | SUBCUTANEOUS | 11 refills | Status: DC

## 2017-06-05 MED ORDER — ALBUTEROL SULFATE (2.5 MG/3ML) 0.083% IN NEBU: 2.5000 mg | INHALATION_SOLUTION | Freq: Four times a day (QID) | RESPIRATORY_TRACT | 12 refills | Status: AC | PRN

## 2017-06-05 NOTE — Care Management Important Message (Signed)
Important Message  Patient Details  Name: Frederich Chicklita N Folson MRN: [id number] Date of Birth: 02/11/1976   Medicare Important Message Given:  Yes    Renie OraHawkins, Kalina Morabito Smith 06/05/2017, 10:19 AM

## 2017-06-05 NOTE — Progress Notes (Signed)
Subjective: She says she feels much better.  No new complaints.  She is listed as being on 7 L with high flow nasal cannula but it actually looks to be set at 6 and her oxygen saturation is in the mid 90s.  This is at rest.  No complaints of productive cough now.  No chest pain  Objective: Vital signs in last 24 hours: Temp:  [97.6 F (36.4 C)-98.3 F (36.8 C)] 98 F (36.7 C) (04/05 0400) Pulse Rate:  [52-102] 52 (04/05 0600) Resp:  [10-20] 11 (04/05 0600) BP: (91-133)/(14-109) 103/68 (04/05 0600) SpO2:  [90 %-97 %] 97 % (04/05 0736) FiO2 (%):  [40 %-45 %] 40 % (04/04 0941) Weight change:  Last BM Date: 06/02/17  Intake/Output from previous day: 04/04 0701 - 04/05 0700 In: -  Out: 2800 [Urine:2800]  PHYSICAL EXAM General appearance: alert, cooperative and no distress Resp: rhonchi bilaterally Cardio: regular rate and rhythm, S1, S2 normal, no murmur, click, rub or gallop GI: soft, non-tender; bowel sounds normal; no masses,  no organomegaly Extremities: extremities normal, atraumatic, no cyanosis or edema  Lab Results:  Results for orders placed or performed during the hospital encounter of 05/25/17 (from the past 48 hour(s))  Glucose, capillary     Status: Abnormal   Collection Time: 06/03/17 11:13 AM  Result Value Ref Range   Glucose-Capillary 129 (H) 65 - 99 mg/dL  Glucose, capillary     Status: None   Collection Time: 06/03/17  4:22 PM  Result Value Ref Range   Glucose-Capillary 99 65 - 99 mg/dL  Glucose, capillary     Status: Abnormal   Collection Time: 06/03/17  9:25 PM  Result Value Ref Range   Glucose-Capillary 132 (H) 65 - 99 mg/dL   Comment 1 Notify RN   Glucose, capillary     Status: Abnormal   Collection Time: 06/04/17  3:13 AM  Result Value Ref Range   Glucose-Capillary 104 (H) 65 - 99 mg/dL   Comment 1 Notify RN   Glucose, capillary     Status: None   Collection Time: 06/04/17  7:23 AM  Result Value Ref Range   Glucose-Capillary 96 65 - 99 mg/dL   Glucose, capillary     Status: Abnormal   Collection Time: 06/04/17 10:48 AM  Result Value Ref Range   Glucose-Capillary 145 (H) 65 - 99 mg/dL  Glucose, capillary     Status: Abnormal   Collection Time: 06/04/17  4:29 PM  Result Value Ref Range   Glucose-Capillary 285 (H) 65 - 99 mg/dL  Glucose, capillary     Status: Abnormal   Collection Time: 06/04/17 10:07 PM  Result Value Ref Range   Glucose-Capillary 113 (H) 65 - 99 mg/dL  Glucose, capillary     Status: Abnormal   Collection Time: 06/05/17  5:36 AM  Result Value Ref Range   Glucose-Capillary 154 (H) 65 - 99 mg/dL  Glucose, capillary     Status: Abnormal   Collection Time: 06/05/17  7:44 AM  Result Value Ref Range   Glucose-Capillary 122 (H) 65 - 99 mg/dL    ABGS No results for input(s): PHART, PO2ART, TCO2, HCO3 in the last 72 hours.  Invalid input(s): PCO2 CULTURES Recent Results (from the past 240 hour(s))  MRSA PCR Screening     Status: None   Collection Time: 05/26/17  2:31 PM  Result Value Ref Range Status   MRSA by PCR NEGATIVE NEGATIVE Final    Comment:  The GeneXpert MRSA Assay (FDA approved for NASAL specimens only), is one component of a comprehensive MRSA colonization surveillance program. It is not intended to diagnose MRSA infection nor to guide or monitor treatment for MRSA infections. Performed at [facility], [address] [place], [place] [address]    Studies/Results: No results found.  Medications:  Prior to Admission:  Facility-Administered Medications Prior to Admission  Medication Dose Route Frequency Provider Last Rate Last Dose  . levalbuterol (XOPENEX) nebulizer solution 0.63 mg  0.63 mg Nebulization Once Magdalen Spatz, NP       Medications Prior to Admission  Medication Sig Dispense Refill Last Dose  . albuterol (PROVENTIL HFA;VENTOLIN HFA) 108 (90 Base) MCG/ACT inhaler Inhale 2 puffs into the lungs every 6 (six) hours as needed for wheezing or shortness of breath. 1  Inhaler 5 05/25/2017 at Unknown time  . amLODipine (NORVASC) 10 MG tablet Take 1 tablet (10 mg total) by mouth daily. 90 tablet 3 05/25/2017 at Unknown time  . diazepam (VALIUM) 10 MG tablet Take 0.5 tablets (5 mg total) by mouth every 8 (eight) hours as needed for anxiety. 30 tablet 0 05/24/2017 at Unknown time  . FLUoxetine (PROZAC) 40 MG capsule Take 40 mg by mouth daily.    05/25/2017 at Unknown time  . Fluticasone-Umeclidin-Vilant (TRELEGY ELLIPTA) 100-62.5-25 MCG/INH AEPB Inhale 1 Inhaler into the lungs daily. written by Dr. Luan Pulling   05/25/2017 at Unknown time  . gabapentin (NEURONTIN) 800 MG tablet Take 1 tablet (800 mg total) 4 (four) times daily by mouth. 120 tablet 0 05/25/2017 at Unknown time  . glipiZIDE (GLUCOTROL) 5 MG tablet TAKE 1 TABLET DAILY BEFORE BREAKFAST. 30 tablet 0 05/25/2017 at Unknown time  . metFORMIN (GLUCOPHAGE) 500 MG tablet Take 1 tablet (500 mg total) by mouth 2 (two) times daily with a meal. 60 tablet 3 05/25/2017 at Unknown time  . mirabegron ER (MYRBETRIQ) 50 MG TB24 tablet Take 1 tablet (50 mg total) by mouth daily. 30 tablet 11 05/25/2017 at Unknown time  . OXYGEN Inhale 2 L into the lungs daily as needed.   unknown at Unknown time  . SUBOXONE 8-2 MG FILM Take 1 Film by mouth 3 (three) times daily.  0 05/25/2017 at Unknown time  . ziprasidone (GEODON) 80 MG capsule Take 80 mg by mouth 2 (two) times daily with a meal.    05/25/2017 at Unknown time  . Blood Glucose Monitoring Suppl (ONETOUCH VERIO) w/Device KIT 1 kit by Does not apply route 2 (two) times daily. 1 kit 0 Taking  . glucose blood (ONETOUCH VERIO) test strip Use as instructed 100 each 12 Taking  . Lancets (ONETOUCH ULTRASOFT) lancets Use as instructed 100 each 12 Taking   Scheduled: . amLODipine  10 mg Oral Daily  . buprenorphine  8 mg Sublingual TID  . enoxaparin (LOVENOX) injection  40 mg Subcutaneous Q24H  . FLUoxetine  40 mg Oral Daily  . furosemide  40 mg Oral Daily  . gabapentin  800 mg Oral QID  .  guaiFENesin  1,200 mg Oral BID  . insulin aspart  0-20 Units Subcutaneous TID WC  . insulin aspart  0-5 Units Subcutaneous QHS  . insulin aspart  14 Units Subcutaneous TID WC  . insulin glargine  50 Units Subcutaneous Daily  . ipratropium-albuterol  3 mL Nebulization TID  . mouth rinse  15 mL Mouth Rinse BID  . mirabegron ER  50 mg Oral Daily  . potassium chloride  30 mEq Oral BID  .  predniSONE  40 mg Oral Q breakfast  . senna-docusate  2 tablet Oral QHS  . ziprasidone  80 mg Oral BID WC   Continuous:  [id number], albuterol, diazepam, LORazepam  Assesment: She was admitted with community-acquired pneumonia and COPD exacerbation and acute diastolic heart failure.  She had severe respiratory failure and is still requiring high flow nasal cannula but that has been able to be decreased from 25 L or 75% FiO2 down to 6 or 7 L.  She feels okay.  She has no new complaints.  Her situation is complicated by bipolar disorder but she is doing very well with that now.  She has diabetes which seems to be doing okay Principal Problem:   CAP (community acquired pneumonia) Active Problems:   Hypokalemia   Depression   Acute respiratory failure with hypoxia ([place])   COPD with acute exacerbation ([facility])   Bipolar disorder ([place])   Hyperlipidemia   Hypomagnesemia   BiPAP (biphasic positive airway pressure) dependence   Type 2 diabetes mellitus (HCC)   Acute diastolic CHF (congestive heart failure) ([place])    Plan: She seems to be approaching baseline but is still requiring high flow nasal cannula.    LOS: 11 days   Kenidi Elenbaas L 06/05/2017, 8:22 AM

## 2017-06-05 NOTE — Progress Notes (Signed)
Discharged to home. All IV access removed. Paperwork with instructions to pick up medication. Husband arrived to transport home. Rolled to car by wheel chair and left in care of husband.

## 2017-06-05 NOTE — Patient Outreach (Signed)
[facility] Care Management  06/05/2017  Latasha [name] 08/18/1975 [id number]   Transition of care  Referral date: 06/01/17 Referral source: hospital liaison referral Insurance: [employer]  Per chart review.  Patient still in hospital.     PLAN: RNCM will continue to follow for hospital discharge disposition  George InaDavina Maahir Horst RN,BSN,CCM [facility] Telephonic  [phone]

## 2017-06-05 NOTE — Progress Notes (Signed)
Walked around ICU with oxygen at 3 lpm oxygen saturations remained at 90%. States that is what she normally uses sometimes at home.

## 2017-06-05 NOTE — Discharge Summary (Signed)
Physician Discharge Summary  Latasha [name] KYH:[id number] DOB: 1976/01/02 DOA: 05/25/2017  PCP: Terald Sleeper, PA-C  Admit date: 05/25/2017 Discharge date: 06/05/2017  Admitted From: Home Disposition: Home  Recommendations for Outpatient Follow-up:  1. Follow up with PCP in 1-2 weeks 2. Please obtain BMP/CBC in one week 3. Follow-up with Dr. Luan Pulling as an outpatient. 4. Repeat chest x-ray in 4 weeks to ensure resolution of pneumonia.  Home Health: Home health RN Equipment/Devices: Continuous oxygen  Discharge Condition: Stable CODE STATUS: Full code Diet recommendation: Heart Healthy / Carb Modified   Brief/Interim Summary: 42 year old female with a history of COPD, bipolar disorder, presented to the hospital with worsening shortness of breath and respiratory failure.  Found to have community-acquired pneumonia/COPD exacerbation/CHF exacerbation and required BiPAP therapy.  Discharge Diagnoses:  Principal Problem:   CAP (community acquired pneumonia) Active Problems:   Hypokalemia   Depression   Acute respiratory failure with hypoxia (HCC)   COPD with acute exacerbation (HCC)   Bipolar disorder (HCC)   Hyperlipidemia   Hypomagnesemia   BiPAP (biphasic positive airway pressure) dependence   Type 2 diabetes mellitus (HCC)   Acute diastolic CHF (congestive heart failure) ([place])  1. Acute respiratory failure with hypoxia.  She is chronically on oxygen at home.  She initially required BiPAP therapy, but was subsequently weaned to high flow nasal cannula.  She was briefly on 20-25 L of oxygen via high flow.  This was eventually weaned down to 3 L.  She is able to ambulate comfortably and is not short of breath. 2. Community-acquired pneumonia.  X-ray shows bilateral pneumonia. She completed 4 days of azithromycin and 7 days of ceftriaxone. She will need repeat chest xray in 4 weeks. Continue pulmonary hygiene.  Overall respiratory status is improved 3. COPD exacerbation.  Initially on  BiPAP therapy, subsequently weaned to high flow nasal cannula.  Currently has been weaned down to 3 L.  Steroids will be tapered by prednisone taper.  She will be continued on bronchodilators.  She has completed a course of antibiotics. 4. Acute diastolic congestive heart failure.  Initially treated with IV Lasix, now on oral Lasix.  Does not appear to be particularly volume overloaded at this time. bnp normal.  Continue on daily Lasix.  Echocardiogram shows diastolic dysfunction with normal EF. 5. Diabetes.  On basal/bolus regimen.  Blood sugar stable.  A1c of 9.9.  We will discharge her on Lantus and NovoLog.  This will likely need adjustment as steroids are tapered. 6. Bipolar disorder.  Continue on home psychotropic medications. 7. Chronic pain.  Continue on gabapentin and Suboxone. 8. Hypertension.  Currently on amlodipine.  Blood pressures currently stable.     Discharge Instructions  Discharge Instructions    AMB Referral to [facility]   Complete by:  As directed    Please assign patient for [facility] Telephonic case manager nurse to engage for transition of care calls and diabetes education. For questions please contact:   Janci Minor RN, [facility] Liaison [phone])   Reason for consult:  Post hospital discharge follow up with [facility] Telephonic Case Manager   Diagnoses of:  Diabetes   Expected date of contact:  1-3 days (reserved for hospital discharges)   Diet - low sodium heart healthy   Complete by:  As directed    Increase activity slowly   Complete by:  As directed      Allergies as of 06/05/2017      Reactions   Aripiprazole  Other (See Comments)   Jerks, Locks up jaws.    Cephalexin Nausea And Vomiting   Haldol [haloperidol Decanoate] Swelling, Other (See Comments)   Locks up jaws   Tegretol [carbamazepine] Other (See Comments)   Blisters, Sores   Zofran Other (See Comments)   Locks up jaw, restlessness    Ketorolac Tromethamine Rash      Medication  List    STOP taking these medications   glipiZIDE 5 MG tablet Commonly known as:  GLUCOTROL   metFORMIN 500 MG tablet Commonly known as:  GLUCOPHAGE     TAKE these medications   albuterol 108 (90 Base) MCG/ACT inhaler Commonly known as:  PROVENTIL HFA;VENTOLIN HFA Inhale 2 puffs into the lungs every 6 (six) hours as needed for wheezing or shortness of breath. What changed:  Another medication with the same name was added. Make sure you understand how and when to take each.   albuterol (2.5 MG/3ML) 0.083% nebulizer solution Commonly known as:  PROVENTIL Take 3 mLs (2.5 mg total) by nebulization every 6 (six) hours as needed for wheezing or shortness of breath. What changed:  You were already taking a medication with the same name, and this prescription was added. Make sure you understand how and when to take each.   amLODipine 10 MG tablet Commonly known as:  NORVASC Take 1 tablet (10 mg total) by mouth daily.   diazepam 10 MG tablet Commonly known as:  VALIUM Take 0.5 tablets (5 mg total) by mouth every 8 (eight) hours as needed for anxiety.   FLUoxetine 40 MG capsule Commonly known as:  PROZAC Take 40 mg by mouth daily.   Fluticasone-Umeclidin-Vilant 100-62.5-25 MCG/INH Aepb Commonly known as:  TRELEGY ELLIPTA Inhale 1 Inhaler into the lungs daily. written by Dr. Luan Pulling   furosemide 40 MG tablet Commonly known as:  LASIX Take 1 tablet (40 mg total) by mouth daily. Start taking on:  06/06/2017   gabapentin 800 MG tablet Commonly known as:  NEURONTIN Take 1 tablet (800 mg total) 4 (four) times daily by mouth.   glucose blood test strip Commonly known as:  ONETOUCH VERIO Use as instructed   guaiFENesin 600 MG 12 hr tablet Commonly known as:  MUCINEX Take 1 tablet (600 mg total) by mouth 2 (two) times daily.   insulin aspart 100 UNIT/ML FlexPen Commonly known as:  NOVOLOG Inject 10 Units into the skin 3 (three) times daily with meals.   Insulin Glargine 100  UNIT/ML Solostar Pen Commonly known as:  LANTUS Inject 40 Units into the skin daily.   Insulin Pen Needle 31G X 5 MM Misc Use as directed   mirabegron ER 50 MG Tb24 tablet Commonly known as:  MYRBETRIQ Take 1 tablet (50 mg total) by mouth daily.   onetouch ultrasoft lancets Use as instructed   ONETOUCH VERIO w/Device Kit 1 kit by Does not apply route 2 (two) times daily.   OXYGEN Inhale 2 L into the lungs daily as needed.   potassium chloride SA 20 MEQ tablet Commonly known as:  K-DUR,KLOR-CON Take 1 tablet (20 mEq total) by mouth daily.   predniSONE 10 MG tablet Commonly known as:  DELTASONE Take '40mg'$  po daily for 2 days then '30mg'$  daily for 2 days then '20mg'$  daily for 2 days then '10mg'$  daily for 2 days then stop   SUBOXONE 8-2 MG Film Generic drug:  Buprenorphine HCl-Naloxone HCl Take 1 Film by mouth 3 (three) times daily.   ziprasidone 80 MG capsule Commonly known as:  GEODON Take 80 mg by mouth 2 (two) times daily with a meal.       Allergies  Allergen Reactions  . Aripiprazole Other (See Comments)    Jerks, Locks up jaws.   . Cephalexin Nausea And Vomiting  . Haldol [Haloperidol Decanoate] Swelling and Other (See Comments)    Locks up jaws  . Tegretol [Carbamazepine] Other (See Comments)    Blisters, Sores  . Zofran Other (See Comments)    Locks up jaw, restlessness   . Ketorolac Tromethamine Rash    Consultations:  Pulmonology   Procedures/Studies: Dg Chest Port 1 View  Result Date: 05/30/2017 CLINICAL DATA:  CHF EXAM: PORTABLE CHEST 1 VIEW COMPARISON:  Yesterday FINDINGS: Diffuse interstitial coarsening with indistinct opacities at the bases symmetrically. Borderline improvement at the right base. Borderline heart size. No visible effusion or pneumothorax. IMPRESSION: Borderline improvement from yesterday at the right base. CHF and/or bilateral pneumonia. Electronically Signed   By: Monte Fantasia M.D.   On: 05/30/2017 08:21   Dg Chest Port 1  View  Result Date: 05/29/2017 CLINICAL DATA:  Followup congestive heart failure and asthma. EXAM: PORTABLE CHEST 1 VIEW COMPARISON:  05/28/2017 FINDINGS: Today's film is under penetrated. Persistence and possible worsening of diffuse pulmonary density most consistent with edema. Lower lobe atelectasis may be worsening. Probable small amount of pleural fluid. IMPRESSION: Under penetrated film, but possible worsening of edema, atelectasis and effusions. Electronically Signed   By: Nelson Chimes M.D.   On: 05/29/2017 07:35   Dg Chest Port 1 View  Result Date: 05/28/2017 CLINICAL DATA:  COPD, asthma EXAM: PORTABLE CHEST 1 VIEW COMPARISON:  05/25/2017 FINDINGS: Diffuse bilateral airspace disease, worsening since prior study. Heart is borderline in size. No visible effusions or acute bony abnormality. IMPRESSION: Worsening diffuse bilateral edema or infection. Electronically Signed   By: Rolm Baptise M.D.   On: 05/28/2017 08:53   Dg Chest Port 1 View  Result Date: 05/25/2017 CLINICAL DATA:  42 year old female with cough.  History of COPD. EXAM: PORTABLE CHEST 1 VIEW COMPARISON:  Chest radiograph dated 11/24/2016 FINDINGS: There is emphysematous changes of the lungs with interstitial coarsening. There is overall slight prominence of the interstitial markings compared to the prior radiograph which may represent superimposed edema, although pneumonia is not excluded. Clinical correlation is recommended. There is no focal consolidation, pleural effusion, or pneumothorax. Stable cardiac silhouette. No acute osseous pathology. IMPRESSION: COPD changes with possible superimposed edema or pneumonia. Clinical correlation is recommended. Electronically Signed   By: Anner Crete M.D.   On: 05/25/2017 22:38    Echo: - Left ventricle: The cavity size was normal. Wall thickness was normal. Systolic function was normal. The estimated ejection fraction was in the range of 60% to 65%. Doppler parameters  are consistent with abnormal left ventricular relaxation (grade 1 diastolic dysfunction). - Aortic valve: Mildly calcified annulus. Trileaflet; normal thickness leaflets. Valve area (VTI): 2.39 cm^2. Valve area  (Vmax): 2.36 cm^2. Valve area (Vmean): 2.23 cm^2.     Subjective: Patient is feeling well this morning.  Denies any cough.  No wheezing.  Discharge Exam: Vitals:   06/05/17 1200 06/05/17 1300  BP: 120/75 109/71  Pulse: 81 73  Resp: 15 13  Temp:    SpO2: 92% 93%   Vitals:   06/05/17 1000 06/05/17 1100 06/05/17 1200 06/05/17 1300  BP: 132/67 96/65 120/75 109/71  Pulse: 83 65 81 73  Resp: '18 14 15 13  '$ Temp:      TempSrc:  SpO2: (!) 83% 94% 92% 93%  Weight:      Height:        General: Pt is alert, awake, not in acute distress Cardiovascular: RRR, S1/S2 +, no rubs, no gallops Respiratory: CTA bilaterally, no wheezing, no rhonchi Abdominal: Soft, NT, ND, bowel sounds + Extremities: no edema, no cyanosis    The results of significant diagnostics from this hospitalization (including imaging, microbiology, ancillary and laboratory) are listed below for reference.     Microbiology: Recent Results (from the past 240 hour(s))  MRSA PCR Screening     Status: None   Collection Time: 05/26/17  2:31 PM  Result Value Ref Range Status   MRSA by PCR NEGATIVE NEGATIVE Final    Comment:        The GeneXpert MRSA Assay (FDA approved for NASAL specimens only), is one component of a comprehensive MRSA colonization surveillance program. It is not intended to diagnose MRSA infection nor to guide or monitor treatment for MRSA infections. Performed at [facility], [address] [place], [place] [address]      Labs: BNP (last 3 results) Recent Labs    06/01/17 1305  BNP 6.0   Basic Metabolic Panel: Recent Labs  Lab 05/30/17 0410 05/31/17 0410 06/01/17 0506 06/02/17 0526 06/03/17 0414  NA 141 137 138 134* 138  K 4.1 4.0 3.8 4.6 4.2  CL 94*  92* 92* 93* 93*  CO2 '28 30 31 31 '$ 32  GLUCOSE 214* 244* 98 311* 121*  BUN 24* 30* 24* 22* 24*  CREATININE 0.68 0.71 0.77 0.66 0.66  CALCIUM 9.3 9.5 9.3 9.4 9.7  MG 2.1 2.1 2.2 2.1  --    Liver Function Tests: No results for input(s): AST, ALT, ALKPHOS, BILITOT, PROT, ALBUMIN in the last 168 hours. No results for input(s): LIPASE, AMYLASE in the last 168 hours. No results for input(s): AMMONIA in the last 168 hours. CBC: Recent Labs  Lab 05/30/17 0410  WBC 13.0*  HGB 13.7  HCT 41.9  MCV 87.3  PLT 217   Cardiac Enzymes: No results for input(s): CKTOTAL, CKMB, CKMBINDEX, TROPONINI in the last 168 hours. BNP: Invalid input(s): POCBNP CBG: Recent Labs  Lab 06/04/17 1629 06/04/17 2207 06/05/17 0536 06/05/17 0744 06/05/17 1153  GLUCAP 285* 113* 154* 122* 90   D-Dimer No results for input(s): DDIMER in the last 72 hours. Hgb A1c No results for input(s): HGBA1C in the last 72 hours. Lipid Profile No results for input(s): CHOL, HDL, LDLCALC, TRIG, CHOLHDL, LDLDIRECT in the last 72 hours. Thyroid function studies No results for input(s): TSH, T4TOTAL, T3FREE, THYROIDAB in the last 72 hours.  Invalid input(s): FREET3 Anemia work up No results for input(s): VITAMINB12, FOLATE, FERRITIN, TIBC, IRON, RETICCTPCT in the last 72 hours. Urinalysis    Component Value Date/Time   COLORURINE YELLOW 10/31/2016 1357   APPEARANCEUR CLEAR 10/31/2016 1357   LABSPEC 1.014 10/31/2016 1357   PHURINE 5.0 10/31/2016 1357   GLUCOSEU NEGATIVE 10/31/2016 1357   HGBUR NEGATIVE 10/31/2016 1357   BILIRUBINUR NEGATIVE 10/31/2016 1357   KETONESUR 5 (A) 10/31/2016 1357   PROTEINUR 30 (A) 10/31/2016 1357   UROBILINOGEN 0.2 10/22/2012 2052   NITRITE NEGATIVE 10/31/2016 1357   LEUKOCYTESUR NEGATIVE 10/31/2016 1357   Sepsis Labs Invalid input(s): PROCALCITONIN,  WBC,  LACTICIDVEN Microbiology Recent Results (from the past 240 hour(s))  MRSA PCR Screening     Status: None   Collection Time:  05/26/17  2:31 PM  Result Value Ref Range Status  MRSA by PCR NEGATIVE NEGATIVE Final    Comment:        The GeneXpert MRSA Assay (FDA approved for NASAL specimens only), is one component of a comprehensive MRSA colonization surveillance program. It is not intended to diagnose MRSA infection nor to guide or monitor treatment for MRSA infections. Performed at [facility], [address], [place], [place] [address]      Time coordinating discharge: 18mns  SIGNED:   JKathie Dike MD  [facility] 06/05/2017, 1:51 PM Pager   If 7PM-7AM, please contact night-coverage [email or web address] Password [email or web address]

## 2017-06-08 ENCOUNTER — Telehealth: Payer: Self-pay | Admitting: Physician Assistant

## 2017-06-08 DIAGNOSIS — J441 Chronic obstructive pulmonary disease with (acute) exacerbation: Secondary | ICD-10-CM

## 2017-06-08 DIAGNOSIS — J449 Chronic obstructive pulmonary disease, unspecified: Secondary | ICD-10-CM | POA: Diagnosis not present

## 2017-06-08 DIAGNOSIS — J189 Pneumonia, unspecified organism: Secondary | ICD-10-CM

## 2017-06-08 NOTE — Telephone Encounter (Signed)
[facility] Patient aware.

## 2017-06-10 ENCOUNTER — Ambulatory Visit: Payer: Self-pay

## 2017-06-11 ENCOUNTER — Other Ambulatory Visit: Payer: Self-pay

## 2017-06-11 DIAGNOSIS — K739 Chronic hepatitis, unspecified: Secondary | ICD-10-CM | POA: Diagnosis not present

## 2017-06-11 NOTE — Patient Outreach (Signed)
[facility]) Care Management  06/11/2017  Latasha [name] August 13, 1975 [id number]   Transition of care  Referral date: 06/01/17 Referral source: hospital liaison referral Insurance: [employer]   Telephone call to patient regarding hospital liaison referral and transition of care follow up. HIPAA verified with patient. Explained reason for call.  Patient declined transition of care and diabetes education follow up. Patient states she is very knowledgeable about diabetes. Patient states she has had several people in her family with diabetes.  Patient states she is a new diabetic as of January 2019. Patient states she was initially on oral medication but is now on insulin. Patient states she met with the diabetic educator in the hospital and feels comfortable with the information she discussed. Patient states she was also give a diabetic education book.  Patient states she checked her blood sugar this morning and fasting result was 136.  Patient again states she does not need to have a nursing calling at this time.  [facility] services declined.   PLAN: RNCM will close patient due to refusal of services.  RNCM will send patients primary MD closure notification.  RNCM will send patient [facility] brochure as discussed.   Quinn Plowman RN,BSN,CCM [facility] Telephonic  [phone]

## 2017-06-15 ENCOUNTER — Telehealth (INDEPENDENT_AMBULATORY_CARE_PROVIDER_SITE_OTHER): Payer: Self-pay | Admitting: Internal Medicine

## 2017-06-15 NOTE — Telephone Encounter (Signed)
err

## 2017-06-16 ENCOUNTER — Ambulatory Visit: Payer: Medicaid Other | Admitting: Physician Assistant

## 2017-06-18 DIAGNOSIS — K739 Chronic hepatitis, unspecified: Secondary | ICD-10-CM | POA: Diagnosis not present

## 2017-06-26 ENCOUNTER — Encounter: Payer: Self-pay | Admitting: Physician Assistant

## 2017-06-26 ENCOUNTER — Ambulatory Visit (INDEPENDENT_AMBULATORY_CARE_PROVIDER_SITE_OTHER): Payer: Medicare Other | Admitting: Physician Assistant

## 2017-06-26 VITALS — BP 123/82 | HR 104 | Temp 97.9°F | Ht 67.0 in | Wt 229.8 lb

## 2017-06-26 DIAGNOSIS — Z8659 Personal history of other mental and behavioral disorders: Secondary | ICD-10-CM | POA: Diagnosis not present

## 2017-06-26 DIAGNOSIS — J441 Chronic obstructive pulmonary disease with (acute) exacerbation: Secondary | ICD-10-CM | POA: Diagnosis not present

## 2017-06-26 DIAGNOSIS — F310 Bipolar disorder, current episode hypomanic: Secondary | ICD-10-CM

## 2017-06-26 DIAGNOSIS — J9601 Acute respiratory failure with hypoxia: Secondary | ICD-10-CM

## 2017-06-26 MED ORDER — GABAPENTIN 800 MG PO TABS: 800.0000 mg | ORAL_TABLET | Freq: Four times a day (QID) | ORAL | 11 refills | Status: DC

## 2017-06-26 NOTE — Patient Instructions (Signed)
In a few days you may receive a survey in the mail or online from [employer] regarding your visit with us today. Please take a moment to fill this out. Your feedback is very important to our whole office. It can help us better understand your needs as well as improve your experience and satisfaction. Thank you for taking your time to complete it. We care about you.  Lyndsy Gilberto, PA-C  

## 2017-06-26 NOTE — Progress Notes (Signed)
BP 123/82   Pulse (!) 104   Temp 97.9 F (36.6 C) (Oral)   Ht '5\' 7"'$  (1.702 m)   Wt 229 lb 12.8 oz (104.2 kg)   BMI 35.99 kg/m    Subjective:    Patient ID: Latasha [name], female    DOB: Jul 18, 1975, 42 y.o.   MRN: [id number]  HPI: Latasha [name] is a 42 y.o. female presenting on 06/26/2017 for Hospitalization Follow-up [facility] )  Patient was admitted for COPD and hypoxemia. She was at [facility], all records are reviewed.  She needs referral to psychiatry because [facility] has closed. We can do refills until she is established with the new provider.  She reports she has been doing really well  Past Medical History:  Diagnosis Date  . ADHD (attention deficit hyperactivity disorder)   . Anxiety and depression   . Arthritis   . Asthma   . Bipolar disorder ([place])   . Chronic back pain   . Chronic hip pain   . Chronic neck pain   . COPD (chronic obstructive pulmonary disease) ([place])   . DDD (degenerative disc disease)   . Fibromyalgia   . Headache(784.0)   . High cholesterol   . Hypertension   . Pneumonia   . PTSD (post-traumatic stress disorder)   . Sciatica   . Seizure ([place])    scar tissue " on Brain". last seizure 11/2012  . Shortness of breath   . Stroke ([place])   . Type 2 diabetes mellitus (HCC)    Relevant past medical, surgical, family and social history reviewed and updated as indicated. Interim medical history since our last visit reviewed. Allergies and medications reviewed and updated. DATA REVIEWED: CHART IN EPIC  Family History reviewed for pertinent findings.  Review of Systems  Constitutional: Negative.  Negative for activity change, fatigue and fever.  HENT: Negative.   Eyes: Negative.   Respiratory: Negative.  Negative for cough.   Cardiovascular: Negative.  Negative for chest pain.  Gastrointestinal: Negative.  Negative for abdominal pain.  Endocrine: Negative.   Genitourinary: Negative.  Negative for dysuria.  Musculoskeletal: Negative.    Skin: Negative.   Neurological: Negative.     Allergies as of 06/26/2017      Reactions   Aripiprazole Other (See Comments)   Jerks, Locks up jaws.    Cephalexin Nausea And Vomiting   Haldol [haloperidol Decanoate] Swelling, Other (See Comments)   Locks up jaws   Tegretol [carbamazepine] Other (See Comments)   Blisters, Sores   Zofran Other (See Comments)   Locks up jaw, restlessness    Ketorolac Tromethamine Rash      Medication List        Accurate as of 06/26/17  1:58 PM. Always use your most recent med list.          albuterol 108 (90 Base) MCG/ACT inhaler Commonly known as:  PROVENTIL HFA;VENTOLIN HFA Inhale 2 puffs into the lungs every 6 (six) hours as needed for wheezing or shortness of breath.   albuterol (2.5 MG/3ML) 0.083% nebulizer solution Commonly known as:  PROVENTIL Take 3 mLs (2.5 mg total) by nebulization every 6 (six) hours as needed for wheezing or shortness of breath.   amLODipine 10 MG tablet Commonly known as:  NORVASC Take 1 tablet (10 mg total) by mouth daily.   diazepam 10 MG tablet Commonly known as:  VALIUM Take 0.5 tablets (5 mg total) by mouth every 8 (eight) hours as needed for anxiety.  FLUoxetine 40 MG capsule Commonly known as:  PROZAC Take 40 mg by mouth daily.   Fluticasone-Umeclidin-Vilant 100-62.5-25 MCG/INH Aepb Commonly known as:  TRELEGY ELLIPTA Inhale 1 Inhaler into the lungs daily. written by Dr. Luan Pulling   furosemide 40 MG tablet Commonly known as:  LASIX Take 1 tablet (40 mg total) by mouth daily.   gabapentin 800 MG tablet Commonly known as:  NEURONTIN Take 1 tablet (800 mg total) by mouth 4 (four) times daily.   glucose blood test strip Commonly known as:  ONETOUCH VERIO Use as instructed   guaiFENesin 600 MG 12 hr tablet Commonly known as:  MUCINEX Take 1 tablet (600 mg total) by mouth 2 (two) times daily.   insulin aspart 100 UNIT/ML FlexPen Commonly known as:  NOVOLOG Inject 10 Units into the skin 3  (three) times daily with meals.   Insulin Glargine 100 UNIT/ML Solostar Pen Commonly known as:  LANTUS Inject 40 Units into the skin daily.   Insulin Pen Needle 31G X 5 MM Misc Use as directed   mirabegron ER 50 MG Tb24 tablet Commonly known as:  MYRBETRIQ Take 1 tablet (50 mg total) by mouth daily.   OLANZapine 5 MG tablet Commonly known as:  ZYPREXA Take 5 mg by mouth at bedtime.   onetouch ultrasoft lancets Use as instructed   ONETOUCH VERIO w/Device Kit 1 kit by Does not apply route 2 (two) times daily.   OXYGEN Inhale 2 L into the lungs daily as needed.   potassium chloride SA 20 MEQ tablet Commonly known as:  K-DUR,KLOR-CON Take 1 tablet (20 mEq total) by mouth daily.   SUBOXONE 8-2 MG Film Generic drug:  Buprenorphine HCl-Naloxone HCl Take 1 Film by mouth 3 (three) times daily.   ziprasidone 80 MG capsule Commonly known as:  GEODON Take 80 mg by mouth 2 (two) times daily with a meal.          Objective:    BP 123/82   Pulse (!) 104   Temp 97.9 F (36.6 C) (Oral)   Ht '5\' 7"'$  (1.702 m)   Wt 229 lb 12.8 oz (104.2 kg)   BMI 35.99 kg/m   Allergies  Allergen Reactions  . Aripiprazole Other (See Comments)    Jerks, Locks up jaws.   . Cephalexin Nausea And Vomiting  . Haldol [Haloperidol Decanoate] Swelling and Other (See Comments)    Locks up jaws  . Tegretol [Carbamazepine] Other (See Comments)    Blisters, Sores  . Zofran Other (See Comments)    Locks up jaw, restlessness   . Ketorolac Tromethamine Rash    Wt Readings from Last 3 Encounters:  06/26/17 229 lb 12.8 oz (104.2 kg)  06/04/17 218 lb 7.6 oz (99.1 kg)  04/09/17 230 lb 12.8 oz (104.7 kg)    Physical Exam  Constitutional: She is oriented to person, place, and time. She appears well-developed and well-nourished.  HENT:  Head: Normocephalic and atraumatic.  Eyes: Pupils are equal, round, and reactive to light. Conjunctivae and EOM are normal.  Cardiovascular: Normal rate, regular  rhythm, normal heart sounds and intact distal pulses.  Pulmonary/Chest: Effort normal and breath sounds normal.  Abdominal: Soft. Bowel sounds are normal.  Neurological: She is alert and oriented to person, place, and time. She has normal reflexes.  Skin: Skin is warm and dry. No rash noted.  Psychiatric: She has a normal mood and affect. Her behavior is normal. Judgment and thought content normal.    Results for orders placed or  performed during the hospital encounter of 05/25/17  MRSA PCR Screening  Result Value Ref Range   MRSA by PCR NEGATIVE NEGATIVE  CBC with Differential  Result Value Ref Range   WBC 17.5 (H) 4.0 - 10.5 K/uL   RBC 4.87 3.87 - 5.11 MIL/uL   Hemoglobin 14.0 12.0 - 15.0 g/dL   HCT 41.5 36.0 - 46.0 %   MCV 85.2 78.0 - 100.0 fL   MCH 28.7 26.0 - 34.0 pg   MCHC 33.7 30.0 - 36.0 g/dL   RDW 13.4 11.5 - 15.5 %   Platelets 198 150 - 400 K/uL   Neutrophils Relative % 87 %   Neutro Abs 15.1 (H) 1.7 - 7.7 K/uL   Lymphocytes Relative 10 %   Lymphs Abs 1.8 0.7 - 4.0 K/uL   Monocytes Relative 3 %   Monocytes Absolute 0.5 0.1 - 1.0 K/uL   Eosinophils Relative 0 %   Eosinophils Absolute 0.1 0.0 - 0.7 K/uL   Basophils Relative 0 %   Basophils Absolute 0.0 0.0 - 0.1 K/uL  Basic metabolic panel  Result Value Ref Range   Sodium 133 (L) 135 - 145 mmol/L   Potassium 3.0 (L) 3.5 - 5.1 mmol/L   Chloride 96 (L) 101 - 111 mmol/L   CO2 22 22 - 32 mmol/L   Glucose, Bld 210 (H) 65 - 99 mg/dL   BUN 5 (L) 6 - 20 mg/dL   Creatinine, Ser 0.70 0.44 - 1.00 mg/dL   Calcium 8.8 (L) 8.9 - 10.3 mg/dL   GFR calc non Af Amer >60 >60 mL/min   GFR calc Af Amer >60 >60 mL/min   Anion gap 15 5 - 15  Magnesium  Result Value Ref Range   Magnesium 1.3 (L) 1.7 - 2.4 mg/dL  Phosphorus  Result Value Ref Range   Phosphorus 2.9 2.5 - 4.6 mg/dL  Strep pneumoniae urinary antigen  Result Value Ref Range   Strep Pneumo Urinary Antigen NEGATIVE NEGATIVE  CBC WITH DIFFERENTIAL  Result Value Ref  Range   WBC 14.5 (H) 4.0 - 10.5 K/uL   RBC 4.91 3.87 - 5.11 MIL/uL   Hemoglobin 14.5 12.0 - 15.0 g/dL   HCT 42.5 36.0 - 46.0 %   MCV 86.6 78.0 - 100.0 fL   MCH 29.5 26.0 - 34.0 pg   MCHC 34.1 30.0 - 36.0 g/dL   RDW 13.5 11.5 - 15.5 %   Platelets 180 150 - 400 K/uL   Neutrophils Relative % 90 %   Neutro Abs 13.0 (H) 1.7 - 7.7 K/uL   Lymphocytes Relative 9 %   Lymphs Abs 1.3 0.7 - 4.0 K/uL   Monocytes Relative 1 %   Monocytes Absolute 0.1 0.1 - 1.0 K/uL   Eosinophils Relative 0 %   Eosinophils Absolute 0.0 0.0 - 0.7 K/uL   Basophils Relative 0 %   Basophils Absolute 0.0 0.0 - 0.1 K/uL  Blood gas, arterial  Result Value Ref Range   FIO2 44.00    Delivery systems NASAL CANNULA    pH, Arterial 7.383 7.350 - 7.450   pCO2 arterial 40.2 32.0 - 48.0 mmHg   pO2, Arterial 51.4 (L) 83.0 - 108.0 mmHg   Bicarbonate 23.2 20.0 - 28.0 mmol/L   Acid-base deficit 1.0 0.0 - 2.0 mmol/L   O2 Saturation 84.0 %   Patient temperature 37.0    Collection site RIGHT RADIAL    Drawn by [id number]    Sample type ARTERIAL DRAW    Allens test (  pass/fail) PASS PASS  Urine rapid drug screen (hosp performed)  Result Value Ref Range   Opiates NONE DETECTED NONE DETECTED   Cocaine NONE DETECTED NONE DETECTED   Benzodiazepines POSITIVE (A) NONE DETECTED   Amphetamines NONE DETECTED NONE DETECTED   Tetrahydrocannabinol POSITIVE (A) NONE DETECTED   Barbiturates NONE DETECTED NONE DETECTED  Influenza panel by PCR (type A & B)  Result Value Ref Range   Influenza A By PCR NEGATIVE NEGATIVE   Influenza B By PCR NEGATIVE NEGATIVE  Glucose, capillary  Result Value Ref Range   Glucose-Capillary 264 (H) 65 - 99 mg/dL  Glucose, capillary  Result Value Ref Range   Glucose-Capillary 282 (H) 65 - 99 mg/dL   Comment 1 Notify RN   Hemoglobin A1c  Result Value Ref Range   Hgb A1c MFr Bld 9.9 (H) 4.8 - 5.6 %   Mean Plasma Glucose 237.43 mg/dL  Glucose, capillary  Result Value Ref Range   Glucose-Capillary 309 (H) 65  - 99 mg/dL  Glucose, capillary  Result Value Ref Range   Glucose-Capillary 285 (H) 65 - 99 mg/dL  Comprehensive metabolic panel  Result Value Ref Range   Sodium 137 135 - 145 mmol/L   Potassium 4.0 3.5 - 5.1 mmol/L   Chloride 100 (L) 101 - 111 mmol/L   CO2 23 22 - 32 mmol/L   Glucose, Bld 262 (H) 65 - 99 mg/dL   BUN 11 6 - 20 mg/dL   Creatinine, Ser 0.69 0.44 - 1.00 mg/dL   Calcium 9.1 8.9 - 10.3 mg/dL   Total Protein 7.4 6.5 - 8.1 g/dL   Albumin 3.4 (L) 3.5 - 5.0 g/dL   AST 41 15 - 41 U/L   ALT 30 14 - 54 U/L   Alkaline Phosphatase 100 38 - 126 U/L   Total Bilirubin 0.6 0.3 - 1.2 mg/dL   GFR calc non Af Amer >60 >60 mL/min   GFR calc Af Amer >60 >60 mL/min   Anion gap 14 5 - 15  Magnesium  Result Value Ref Range   Magnesium 2.1 1.7 - 2.4 mg/dL  Magnesium  Result Value Ref Range   Magnesium 2.1 1.7 - 2.4 mg/dL  Comprehensive metabolic panel  Result Value Ref Range   Sodium 143 135 - 145 mmol/L   Potassium 4.1 3.5 - 5.1 mmol/L   Chloride 104 101 - 111 mmol/L   CO2 27 22 - 32 mmol/L   Glucose, Bld 197 (H) 65 - 99 mg/dL   BUN 11 6 - 20 mg/dL   Creatinine, Ser 0.59 0.44 - 1.00 mg/dL   Calcium 9.0 8.9 - 10.3 mg/dL   Total Protein 7.1 6.5 - 8.1 g/dL   Albumin 3.2 (L) 3.5 - 5.0 g/dL   AST 30 15 - 41 U/L   ALT 24 14 - 54 U/L   Alkaline Phosphatase 93 38 - 126 U/L   Total Bilirubin 0.4 0.3 - 1.2 mg/dL   GFR calc non Af Amer >60 >60 mL/min   GFR calc Af Amer >60 >60 mL/min   Anion gap 12 5 - 15  CBC  Result Value Ref Range   WBC 13.1 (H) 4.0 - 10.5 K/uL   RBC 4.48 3.87 - 5.11 MIL/uL   Hemoglobin 12.8 12.0 - 15.0 g/dL   HCT 40.2 36.0 - 46.0 %   MCV 89.7 78.0 - 100.0 fL   MCH 28.6 26.0 - 34.0 pg   MCHC 31.8 30.0 - 36.0 g/dL   RDW 14.0 11.5 -  15.5 %   Platelets 202 150 - 400 K/uL  Glucose, capillary  Result Value Ref Range   Glucose-Capillary 224 (H) 65 - 99 mg/dL  Glucose, capillary  Result Value Ref Range   Glucose-Capillary 251 (H) 65 - 99 mg/dL  Glucose,  capillary  Result Value Ref Range   Glucose-Capillary 179 (H) 65 - 99 mg/dL  Glucose, capillary  Result Value Ref Range   Glucose-Capillary 175 (H) 65 - 99 mg/dL  Glucose, capillary  Result Value Ref Range   Glucose-Capillary 275 (H) 65 - 99 mg/dL  CBC with Differential/Platelet  Result Value Ref Range   WBC 9.1 4.0 - 10.5 K/uL   RBC 4.65 3.87 - 5.11 MIL/uL   Hemoglobin 13.3 12.0 - 15.0 g/dL   HCT 41.0 36.0 - 46.0 %   MCV 88.2 78.0 - 100.0 fL   MCH 28.6 26.0 - 34.0 pg   MCHC 32.4 30.0 - 36.0 g/dL   RDW 13.5 11.5 - 15.5 %   Platelets 208 150 - 400 K/uL   Neutrophils Relative % 85 %   Neutro Abs 7.8 (H) 1.7 - 7.7 K/uL   Lymphocytes Relative 11 %   Lymphs Abs 1.0 0.7 - 4.0 K/uL   Monocytes Relative 4 %   Monocytes Absolute 0.3 0.1 - 1.0 K/uL   Eosinophils Relative 0 %   Eosinophils Absolute 0.0 0.0 - 0.7 K/uL   Basophils Relative 0 %   Basophils Absolute 0.0 0.0 - 0.1 K/uL  Comprehensive metabolic panel  Result Value Ref Range   Sodium 137 135 - 145 mmol/L   Potassium 3.6 3.5 - 5.1 mmol/L   Chloride 93 (L) 101 - 111 mmol/L   CO2 28 22 - 32 mmol/L   Glucose, Bld 307 (H) 65 - 99 mg/dL   BUN 19 6 - 20 mg/dL   Creatinine, Ser 0.63 0.44 - 1.00 mg/dL   Calcium 9.3 8.9 - 10.3 mg/dL   Total Protein 7.5 6.5 - 8.1 g/dL   Albumin 3.3 (L) 3.5 - 5.0 g/dL   AST 20 15 - 41 U/L   ALT 23 14 - 54 U/L   Alkaline Phosphatase 106 38 - 126 U/L   Total Bilirubin 0.6 0.3 - 1.2 mg/dL   GFR calc non Af Amer >60 >60 mL/min   GFR calc Af Amer >60 >60 mL/min   Anion gap 16 (H) 5 - 15  Magnesium  Result Value Ref Range   Magnesium 2.0 1.7 - 2.4 mg/dL  Glucose, capillary  Result Value Ref Range   Glucose-Capillary 271 (H) 65 - 99 mg/dL  Glucose, capillary  Result Value Ref Range   Glucose-Capillary 365 (H) 65 - 99 mg/dL  Glucose, capillary  Result Value Ref Range   Glucose-Capillary 235 (H) 65 - 99 mg/dL  Glucose, capillary  Result Value Ref Range   Glucose-Capillary 255 (H) 65 - 99  mg/dL  Glucose, capillary  Result Value Ref Range   Glucose-Capillary 155 (H) 65 - 99 mg/dL  Basic metabolic panel  Result Value Ref Range   Sodium 141 135 - 145 mmol/L   Potassium 4.1 3.5 - 5.1 mmol/L   Chloride 94 (L) 101 - 111 mmol/L   CO2 28 22 - 32 mmol/L   Glucose, Bld 214 (H) 65 - 99 mg/dL   BUN 24 (H) 6 - 20 mg/dL   Creatinine, Ser 0.68 0.44 - 1.00 mg/dL   Calcium 9.3 8.9 - 10.3 mg/dL   GFR calc non Af Amer >60 >60 mL/min  GFR calc Af Amer >60 >60 mL/min   Anion gap 19 (H) 5 - 15  CBC  Result Value Ref Range   WBC 13.0 (H) 4.0 - 10.5 K/uL   RBC 4.80 3.87 - 5.11 MIL/uL   Hemoglobin 13.7 12.0 - 15.0 g/dL   HCT 41.9 36.0 - 46.0 %   MCV 87.3 78.0 - 100.0 fL   MCH 28.5 26.0 - 34.0 pg   MCHC 32.7 30.0 - 36.0 g/dL   RDW 13.1 11.5 - 15.5 %   Platelets 217 150 - 400 K/uL  Magnesium  Result Value Ref Range   Magnesium 2.1 1.7 - 2.4 mg/dL  Glucose, capillary  Result Value Ref Range   Glucose-Capillary 210 (H) 65 - 99 mg/dL   Comment 1 Notify RN    Comment 2 Document in Chart   Glucose, capillary  Result Value Ref Range   Glucose-Capillary 218 (H) 65 - 99 mg/dL   Comment 1 Notify RN    Comment 2 Document in Chart   Glucose, capillary  Result Value Ref Range   Glucose-Capillary 240 (H) 65 - 99 mg/dL  Blood gas, arterial  Result Value Ref Range   FIO2 0.90    O2 Content 30.0 L/min   Delivery systems HI FLOW NASAL CANNULA    pH, Arterial 7.467 (H) 7.350 - 7.450   pCO2 arterial 47.3 32.0 - 48.0 mmHg   pO2, Arterial 110 (H) 83.0 - 108.0 mmHg   Bicarbonate 32.6 (H) 20.0 - 28.0 mmol/L   Acid-Base Excess 9.6 (H) 0.0 - 2.0 mmol/L   O2 Saturation 98.1 %   Patient temperature 37.0    Collection site LEFT RADIAL    Drawn by [id number]    Sample type ARTERIAL DRAW    Allens test (pass/fail) PASS PASS  Glucose, capillary  Result Value Ref Range   Glucose-Capillary 171 (H) 65 - 99 mg/dL  Glucose, capillary  Result Value Ref Range   Glucose-Capillary 98 65 - 99 mg/dL  Basic  metabolic panel  Result Value Ref Range   Sodium 137 135 - 145 mmol/L   Potassium 4.0 3.5 - 5.1 mmol/L   Chloride 92 (L) 101 - 111 mmol/L   CO2 30 22 - 32 mmol/L   Glucose, Bld 244 (H) 65 - 99 mg/dL   BUN 30 (H) 6 - 20 mg/dL   Creatinine, Ser 0.71 0.44 - 1.00 mg/dL   Calcium 9.5 8.9 - 10.3 mg/dL   GFR calc non Af Amer >60 >60 mL/min   GFR calc Af Amer >60 >60 mL/min   Anion gap 15 5 - 15  Magnesium  Result Value Ref Range   Magnesium 2.1 1.7 - 2.4 mg/dL  Glucose, capillary  Result Value Ref Range   Glucose-Capillary 223 (H) 65 - 99 mg/dL   Comment 1 Notify RN   Glucose, capillary  Result Value Ref Range   Glucose-Capillary 243 (H) 65 - 99 mg/dL  Glucose, capillary  Result Value Ref Range   Glucose-Capillary 227 (H) 65 - 99 mg/dL  Glucose, capillary  Result Value Ref Range   Glucose-Capillary 62 (L) 65 - 99 mg/dL   Comment 1 Notify RN   Glucose, capillary  Result Value Ref Range   Glucose-Capillary 222 (H) 65 - 99 mg/dL  Glucose, capillary  Result Value Ref Range   Glucose-Capillary 87 65 - 99 mg/dL  Basic metabolic panel  Result Value Ref Range   Sodium 138 135 - 145 mmol/L   Potassium 3.8 3.5 - 5.1  mmol/L   Chloride 92 (L) 101 - 111 mmol/L   CO2 31 22 - 32 mmol/L   Glucose, Bld 98 65 - 99 mg/dL   BUN 24 (H) 6 - 20 mg/dL   Creatinine, Ser 0.77 0.44 - 1.00 mg/dL   Calcium 9.3 8.9 - 10.3 mg/dL   GFR calc non Af Amer >60 >60 mL/min   GFR calc Af Amer >60 >60 mL/min   Anion gap 15 5 - 15  Magnesium  Result Value Ref Range   Magnesium 2.2 1.7 - 2.4 mg/dL  Glucose, capillary  Result Value Ref Range   Glucose-Capillary 133 (H) 65 - 99 mg/dL   Comment 1 Notify RN   Glucose, capillary  Result Value Ref Range   Glucose-Capillary 88 65 - 99 mg/dL   Comment 1 Notify RN   Glucose, capillary  Result Value Ref Range   Glucose-Capillary 132 (H) 65 - 99 mg/dL  Glucose, capillary  Result Value Ref Range   Glucose-Capillary 213 (H) 65 - 99 mg/dL  Brain natriuretic  peptide  Result Value Ref Range   B Natriuretic Peptide 6.0 0.0 - 100.0 pg/mL  Glucose, capillary  Result Value Ref Range   Glucose-Capillary 150 (H) 65 - 99 mg/dL  Basic metabolic panel  Result Value Ref Range   Sodium 134 (L) 135 - 145 mmol/L   Potassium 4.6 3.5 - 5.1 mmol/L   Chloride 93 (L) 101 - 111 mmol/L   CO2 31 22 - 32 mmol/L   Glucose, Bld 311 (H) 65 - 99 mg/dL   BUN 22 (H) 6 - 20 mg/dL   Creatinine, Ser 0.66 0.44 - 1.00 mg/dL   Calcium 9.4 8.9 - 10.3 mg/dL   GFR calc non Af Amer >60 >60 mL/min   GFR calc Af Amer >60 >60 mL/min   Anion gap 10 5 - 15  Magnesium  Result Value Ref Range   Magnesium 2.1 1.7 - 2.4 mg/dL  Glucose, capillary  Result Value Ref Range   Glucose-Capillary 232 (H) 65 - 99 mg/dL  Glucose, capillary  Result Value Ref Range   Glucose-Capillary 255 (H) 65 - 99 mg/dL  Glucose, capillary  Result Value Ref Range   Glucose-Capillary 259 (H) 65 - 99 mg/dL  Glucose, capillary  Result Value Ref Range   Glucose-Capillary 292 (H) 65 - 99 mg/dL  Glucose, capillary  Result Value Ref Range   Glucose-Capillary 109 (H) 65 - 99 mg/dL  Glucose, capillary  Result Value Ref Range   Glucose-Capillary 93 65 - 99 mg/dL  Basic metabolic panel  Result Value Ref Range   Sodium 138 135 - 145 mmol/L   Potassium 4.2 3.5 - 5.1 mmol/L   Chloride 93 (L) 101 - 111 mmol/L   CO2 32 22 - 32 mmol/L   Glucose, Bld 121 (H) 65 - 99 mg/dL   BUN 24 (H) 6 - 20 mg/dL   Creatinine, Ser 0.66 0.44 - 1.00 mg/dL   Calcium 9.7 8.9 - 10.3 mg/dL   GFR calc non Af Amer >60 >60 mL/min   GFR calc Af Amer >60 >60 mL/min   Anion gap 13 5 - 15  Glucose, capillary  Result Value Ref Range   Glucose-Capillary 106 (H) 65 - 99 mg/dL  Glucose, capillary  Result Value Ref Range   Glucose-Capillary 121 (H) 65 - 99 mg/dL  Glucose, capillary  Result Value Ref Range   Glucose-Capillary 127 (H) 65 - 99 mg/dL  Glucose, capillary  Result Value Ref Range  Glucose-Capillary 129 (H) 65 - 99 mg/dL   Glucose, capillary  Result Value Ref Range   Glucose-Capillary 99 65 - 99 mg/dL  Glucose, capillary  Result Value Ref Range   Glucose-Capillary 132 (H) 65 - 99 mg/dL   Comment 1 Notify RN   Glucose, capillary  Result Value Ref Range   Glucose-Capillary 104 (H) 65 - 99 mg/dL   Comment 1 Notify RN   Glucose, capillary  Result Value Ref Range   Glucose-Capillary 96 65 - 99 mg/dL  Glucose, capillary  Result Value Ref Range   Glucose-Capillary 145 (H) 65 - 99 mg/dL  Glucose, capillary  Result Value Ref Range   Glucose-Capillary 285 (H) 65 - 99 mg/dL  Glucose, capillary  Result Value Ref Range   Glucose-Capillary 113 (H) 65 - 99 mg/dL  Glucose, capillary  Result Value Ref Range   Glucose-Capillary 154 (H) 65 - 99 mg/dL  Glucose, capillary  Result Value Ref Range   Glucose-Capillary 122 (H) 65 - 99 mg/dL  Glucose, capillary  Result Value Ref Range   Glucose-Capillary 90 65 - 99 mg/dL  CBG monitoring, ED  Result Value Ref Range   Glucose-Capillary 328 (H) 65 - 99 mg/dL  CBG monitoring, ED  Result Value Ref Range   Glucose-Capillary 427 (H) 65 - 99 mg/dL  CBG monitoring, ED  Result Value Ref Range   Glucose-Capillary 361 (H) 65 - 99 mg/dL  ECHOCARDIOGRAM COMPLETE  Result Value Ref Range   Weight 3,470.92 oz   Height 67 in   BP 124/66 mmHg      Assessment & Plan:   1. Bipolar affective disorder, current episode hypomanic (HCC) - OLANZapine (ZYPREXA) 5 MG tablet; Take 5 mg by mouth at bedtime. - Ambulatory referral to Psychiatry  2. History of borderline personality disorder - Ambulatory referral to Psychiatry  3. Acute respiratory failure with hypoxia (HCC)  4. COPD exacerbation ([place])   Continue all other maintenance medications as listed above.  Follow up plan: Return in about 3 months (around 09/25/2017) for labs and check.  Educational handout given for [place] PA-C [facility] [address]  [place], [place]  [id number] [phone]   06/26/2017, 1:58 PM

## 2017-07-08 DIAGNOSIS — J449 Chronic obstructive pulmonary disease, unspecified: Secondary | ICD-10-CM | POA: Diagnosis not present

## 2017-07-09 ENCOUNTER — Other Ambulatory Visit: Payer: Self-pay | Admitting: *Deleted

## 2017-07-09 MED ORDER — INSULIN PEN NEEDLE 31G X 5 MM MISC: 3 refills | Status: AC

## 2017-07-09 MED ORDER — FLUOXETINE HCL 40 MG PO CAPS: 40.0000 mg | ORAL_CAPSULE | Freq: Every day | ORAL | 0 refills | Status: DC

## 2017-07-16 DIAGNOSIS — K739 Chronic hepatitis, unspecified: Secondary | ICD-10-CM | POA: Diagnosis not present

## 2017-08-08 DIAGNOSIS — J449 Chronic obstructive pulmonary disease, unspecified: Secondary | ICD-10-CM | POA: Diagnosis not present

## 2017-08-13 DIAGNOSIS — K739 Chronic hepatitis, unspecified: Secondary | ICD-10-CM | POA: Diagnosis not present

## 2017-08-20 NOTE — Telephone Encounter (Signed)
Error in charting.

## 2017-08-25 ENCOUNTER — Encounter: Payer: Self-pay | Admitting: *Deleted

## 2017-09-07 ENCOUNTER — Other Ambulatory Visit: Payer: Self-pay | Admitting: Physician Assistant

## 2017-09-07 DIAGNOSIS — J449 Chronic obstructive pulmonary disease, unspecified: Secondary | ICD-10-CM | POA: Diagnosis not present

## 2017-09-07 NOTE — Telephone Encounter (Signed)
I don't see that we do her Geodon or Diazepam. Please advise.

## 2017-09-08 DIAGNOSIS — K739 Chronic hepatitis, unspecified: Secondary | ICD-10-CM | POA: Diagnosis not present

## 2017-09-09 NOTE — Telephone Encounter (Signed)
Patient aware that we can not fill. She states that she is no longer going to behavioral health that they closed and her Dr. Linton HamMoved. Patient advised that she would have to be seen.

## 2017-09-09 NOTE — Telephone Encounter (Signed)
I do not fill this, that is correct

## 2017-09-15 ENCOUNTER — Encounter: Payer: Self-pay | Admitting: Physician Assistant

## 2017-09-15 ENCOUNTER — Ambulatory Visit (INDEPENDENT_AMBULATORY_CARE_PROVIDER_SITE_OTHER): Payer: Medicare Other | Admitting: Physician Assistant

## 2017-09-15 VITALS — BP 126/83 | HR 83 | Ht 67.0 in | Wt 236.4 lb

## 2017-09-15 DIAGNOSIS — Z794 Long term (current) use of insulin: Secondary | ICD-10-CM | POA: Diagnosis not present

## 2017-09-15 DIAGNOSIS — F319 Bipolar disorder, unspecified: Secondary | ICD-10-CM | POA: Diagnosis not present

## 2017-09-15 DIAGNOSIS — E118 Type 2 diabetes mellitus with unspecified complications: Secondary | ICD-10-CM | POA: Diagnosis not present

## 2017-09-15 DIAGNOSIS — Z8659 Personal history of other mental and behavioral disorders: Secondary | ICD-10-CM

## 2017-09-15 MED ORDER — ZIPRASIDONE HCL 80 MG PO CAPS: 80.0000 mg | ORAL_CAPSULE | Freq: Two times a day (BID) | ORAL | 5 refills | Status: DC

## 2017-09-15 MED ORDER — FLUOXETINE HCL 40 MG PO CAPS: 40.0000 mg | ORAL_CAPSULE | Freq: Every day | ORAL | 3 refills | Status: DC

## 2017-09-16 NOTE — Progress Notes (Signed)
BP 126/83   Pulse 83   Ht _0  (1.702 m)   Wt 236 lb 6.4 oz (107.2 kg)   BMI 37.03 kg/m    Subjective:    Patient ID: Latasha [name], female    DOB: October 23, 1975, 42 y.o.   MRN: [id number]  HPI: Latasha [name] is a 42 y.o. female presenting on 09/15/2017 for Manic Behavior and Medication Refill  This patient comes in for 15-monthrecheck on her chronic medical conditions.  She does have diabetes, bipolar disorder, personality disorder, neuropathy.  She is seen several specialist for her conditions.  She tried to get an appointment with psychiatry but they would not see her until they had her previous records.  [facility] [place] [facility] [place]  However this clinic has closed.  We are going to try to get the records from them for ourselves and then we can forward it to the psychiatrist.  I have placed an order for a psych evaluation.  We have had a long discussion about how she is feeling and that if anything gets worse she will go to the hospital herself to be admitted.  She denies suicidal or homicidal ideation at this time.  [facility] 09/15/2017 06/26/2017 04/21/2017 03/13/2017 02/25/2017  Decreased Interest _1 Down, Depressed, Hopeless _2 PHQ - 2 Score _3 Altered sleeping _4 Tired, decreased energy _5 Change in appetite _6 Feeling bad or failure about yourself  _7 Trouble concentrating _8 Moving slowly or fidgety/restless 0 0 0 2 2  Suicidal thoughts 1 2 - 3 0  PHQ-9 Score _9 Past Medical History:  Diagnosis Date  . ADHD (attention deficit hyperactivity disorder)   . Anxiety and depression   . Arthritis   . Asthma   . Bipolar disorder ([place]   . Chronic back pain   . Chronic hip pain   . Chronic neck pain   . COPD (chronic obstructive pulmonary disease) ([place]   . DDD (degenerative disc disease)   . Fibromyalgia   . Headache(784.0)   . High cholesterol    . Hypertension   . Pneumonia   . PTSD (post-traumatic stress disorder)   . Sciatica   . Seizure ([place]    scar tissue " on Brain". last seizure 11/2012  . Stroke ([facility]   . Type 2 diabetes mellitus (HCC)    Relevant past medical, surgical, family and social history reviewed and updated as indicated. Interim medical history since our last visit reviewed. Allergies and medications reviewed and updated. DATA REVIEWED: CHART IN EPIC  Family History reviewed for pertinent findings.  Review of Systems  Constitutional: Negative.  Negative for activity change, fatigue and fever.  HENT: Negative.   Eyes: Negative.   Respiratory: Negative.  Negative for cough.   Cardiovascular: Negative.  Negative for chest pain.  Gastrointestinal: Negative.  Negative for abdominal pain.  Endocrine: Negative.   Genitourinary: Negative.  Negative for dysuria.  Musculoskeletal: Negative.   Skin: Negative.   Neurological: Negative.   Psychiatric/Behavioral: Positive for agitation, decreased concentration, dysphoric mood and sleep disturbance. Negative for suicidal ideas. The patient is nervous/anxious.     Allergies as of 09/15/2017  Reactions   Aripiprazole Other (See Comments)   Jerks, Locks up jaws.    Cephalexin Nausea And Vomiting   Haldol [haloperidol Decanoate] Swelling, Other (See Comments)   Locks up jaws   Tegretol [carbamazepine] Other (See Comments)   Blisters, Sores   Zofran Other (See Comments)   Locks up jaw, restlessness    Ketorolac Tromethamine Rash      Medication List        Accurate as of 09/15/17 11:59 PM. Always use your most recent med list.          albuterol 108 (90 Base) MCG/ACT inhaler Commonly known as:  PROVENTIL HFA;VENTOLIN HFA Inhale 2 puffs into the lungs every 6 (six) hours as needed for wheezing or shortness of breath.   albuterol (2.5 MG/3ML) 0.083% nebulizer solution Commonly known as:  PROVENTIL Take 3 mLs (2.5 mg total) by nebulization every 6  (six) hours as needed for wheezing or shortness of breath.   amLODipine 10 MG tablet Commonly known as:  NORVASC Take 1 tablet (10 mg total) by mouth daily.   diazepam 10 MG tablet Commonly known as:  VALIUM Take 0.5 tablets (5 mg total) by mouth every 8 (eight) hours as needed for anxiety.   FLUoxetine 40 MG capsule Commonly known as:  PROZAC Take 1 capsule (40 mg total) by mouth daily.   Fluticasone-Umeclidin-Vilant 100-62.5-25 MCG/INH Aepb Commonly known as:  TRELEGY ELLIPTA Inhale 1 Inhaler into the lungs daily. written by Dr. Luan Pulling   furosemide 40 MG tablet Commonly known as:  LASIX Take 1 tablet (40 mg total) by mouth daily.   gabapentin 800 MG tablet Commonly known as:  NEURONTIN Take 1 tablet (800 mg total) by mouth 4 (four) times daily.   glucose blood test strip Commonly known as:  ONETOUCH VERIO Use as instructed   guaiFENesin 600 MG 12 hr tablet Commonly known as:  MUCINEX Take 1 tablet (600 mg total) by mouth 2 (two) times daily.   insulin aspart 100 UNIT/ML FlexPen Commonly known as:  NOVOLOG Inject 10 Units into the skin 3 (three) times daily with meals.   Insulin Glargine 100 UNIT/ML Solostar Pen Commonly known as:  LANTUS Inject 40 Units into the skin daily.   Insulin Pen Needle 31G X 5 MM Misc Use TID with Insulin   mirabegron ER 50 MG Tb24 tablet Commonly known as:  MYRBETRIQ Take 1 tablet (50 mg total) by mouth daily.   OLANZapine 5 MG tablet Commonly known as:  ZYPREXA Take 5 mg by mouth at bedtime.   onetouch ultrasoft lancets Use as instructed   ONETOUCH VERIO w/Device Kit 1 kit by Does not apply route 2 (two) times daily.   OXYGEN Inhale 2 L into the lungs daily as needed.   potassium chloride SA 20 MEQ tablet Commonly known as:  K-DUR,KLOR-CON Take 1 tablet (20 mEq total) by mouth daily.   SUBOXONE 8-2 MG Film Generic drug:  Buprenorphine HCl-Naloxone HCl Take 1 Film by mouth 3 (three) times daily.   ziprasidone 80 MG  capsule Commonly known as:  GEODON Take 1 capsule (80 mg total) by mouth 2 (two) times daily with a meal.          Objective:    BP 126/83   Pulse 83   Ht _0  (1.702 m)   Wt 236 lb 6.4 oz (107.2 kg)   BMI 37.03 kg/m   Allergies  Allergen Reactions  . Aripiprazole Other (See Comments)    Jerks, Locks up  jaws.   . Cephalexin Nausea And Vomiting  . Haldol [Haloperidol Decanoate] Swelling and Other (See Comments)    Locks up jaws  . Tegretol [Carbamazepine] Other (See Comments)    Blisters, Sores  . Zofran Other (See Comments)    Locks up jaw, restlessness   . Ketorolac Tromethamine Rash    Wt Readings from Last 3 Encounters:  09/15/17 236 lb 6.4 oz (107.2 kg)  06/26/17 229 lb 12.8 oz (104.2 kg)  06/04/17 218 lb 7.6 oz (99.1 kg)    Physical Exam  Constitutional: She is oriented to person, place, and time. She appears well-developed and well-nourished.  HENT:  Head: Normocephalic and atraumatic.  Eyes: Pupils are equal, round, and reactive to light. Conjunctivae and EOM are normal.  Cardiovascular: Normal rate, regular rhythm, normal heart sounds and intact distal pulses.  Pulmonary/Chest: Effort normal and breath sounds normal.  Abdominal: Soft. Bowel sounds are normal.  Neurological: She is alert and oriented to person, place, and time. She has normal reflexes.  Skin: Skin is warm and dry. No rash noted.  Psychiatric: Judgment normal. Her mood appears anxious. Her speech is rapid and/or pressured. She is hyperactive. Thought content is not paranoid. Cognition and memory are normal. She expresses no homicidal and no suicidal ideation. She expresses no suicidal plans and no homicidal plans.        Assessment & Plan:   1. Bipolar affective disorder, remission status unspecified ([place]) - ziprasidone (GEODON) 80 MG capsule; Take 1 capsule (80 mg total) by mouth 2 (two) times daily with a meal.  Dispense: 60 capsule; Refill: 5 - FLUoxetine (PROZAC) 40 MG capsule; Take 1  capsule (40 mg total) by mouth daily.  Dispense: 90 capsule; Refill: 3 - Ambulatory referral to Psychiatry  2. History of borderline personality disorder - Ambulatory referral to Psychiatry  3. Type 2 diabetes mellitus with complication, with long-term current use of insulin (HCC) Diet and exercise   Continue all other maintenance medications as listed above.  Follow up plan: Return in about 2 months (around 11/16/2017) for records from [place] and [place].  Educational handout given for [place] PA-C [place] [address]  [place], [place] [address] [phone]   09/16/2017, 11:25 AM

## 2017-09-24 ENCOUNTER — Ambulatory Visit: Payer: Medicare Other | Admitting: Physician Assistant

## 2017-09-25 ENCOUNTER — Ambulatory Visit: Payer: Medicare Other | Admitting: Physician Assistant

## 2017-09-27 DIAGNOSIS — J42 Unspecified chronic bronchitis: Secondary | ICD-10-CM | POA: Diagnosis not present

## 2017-09-27 DIAGNOSIS — J9809 Other diseases of bronchus, not elsewhere classified: Secondary | ICD-10-CM | POA: Diagnosis not present

## 2017-09-27 DIAGNOSIS — Z79899 Other long term (current) drug therapy: Secondary | ICD-10-CM | POA: Diagnosis not present

## 2017-09-27 DIAGNOSIS — Z720 Tobacco use: Secondary | ICD-10-CM | POA: Diagnosis not present

## 2017-09-27 DIAGNOSIS — J45909 Unspecified asthma, uncomplicated: Secondary | ICD-10-CM | POA: Diagnosis not present

## 2017-09-28 ENCOUNTER — Other Ambulatory Visit (INDEPENDENT_AMBULATORY_CARE_PROVIDER_SITE_OTHER): Payer: Self-pay | Admitting: *Deleted

## 2017-09-28 ENCOUNTER — Encounter (INDEPENDENT_AMBULATORY_CARE_PROVIDER_SITE_OTHER): Payer: Self-pay | Admitting: *Deleted

## 2017-09-28 DIAGNOSIS — R768 Other specified abnormal immunological findings in serum: Secondary | ICD-10-CM

## 2017-09-28 NOTE — Progress Notes (Signed)
Other

## 2017-10-06 DIAGNOSIS — K739 Chronic hepatitis, unspecified: Secondary | ICD-10-CM | POA: Diagnosis not present

## 2017-10-08 DIAGNOSIS — J449 Chronic obstructive pulmonary disease, unspecified: Secondary | ICD-10-CM | POA: Diagnosis not present

## 2017-11-03 DIAGNOSIS — K739 Chronic hepatitis, unspecified: Secondary | ICD-10-CM | POA: Diagnosis not present

## 2017-11-04 ENCOUNTER — Other Ambulatory Visit: Payer: Self-pay | Admitting: Physician Assistant

## 2017-11-08 DIAGNOSIS — J449 Chronic obstructive pulmonary disease, unspecified: Secondary | ICD-10-CM | POA: Diagnosis not present

## 2017-11-16 ENCOUNTER — Encounter: Payer: Self-pay | Admitting: Physician Assistant

## 2017-11-16 ENCOUNTER — Ambulatory Visit (INDEPENDENT_AMBULATORY_CARE_PROVIDER_SITE_OTHER): Payer: Medicare Other | Admitting: Physician Assistant

## 2017-11-16 ENCOUNTER — Ambulatory Visit: Payer: Medicare Other | Admitting: Physician Assistant

## 2017-11-16 VITALS — BP 150/101 | HR 99 | Temp 99.3°F | Ht 67.0 in | Wt 242.2 lb

## 2017-11-16 DIAGNOSIS — F319 Bipolar disorder, unspecified: Secondary | ICD-10-CM | POA: Diagnosis not present

## 2017-11-16 DIAGNOSIS — M25562 Pain in left knee: Secondary | ICD-10-CM | POA: Diagnosis not present

## 2017-11-16 DIAGNOSIS — G8929 Other chronic pain: Secondary | ICD-10-CM

## 2017-11-16 MED ORDER — FLUOXETINE HCL 40 MG PO CAPS: 80.0000 mg | ORAL_CAPSULE | Freq: Every day | ORAL | 1 refills | Status: DC

## 2017-11-16 MED ORDER — LAMOTRIGINE 25 MG PO TABS: 25.0000 mg | ORAL_TABLET | Freq: Two times a day (BID) | ORAL | 1 refills | Status: DC

## 2017-11-17 NOTE — Progress Notes (Signed)
BP (!) 150/101   Pulse 99   Temp 99.3 F (37.4 C) (Oral)   Ht '5\' 7"'$  (1.702 m)   Wt 242 lb 3.2 oz (109.9 kg)   BMI 37.93 kg/m    Subjective:    Patient ID: Latasha [name], female    DOB: Jun 05, 1975, 42 y.o.   MRN: [id number]  HPI: [name] is a 42 y.o. female presenting on 11/16/2017 for Manic Behavior  This patient comes in for a follow-up on her regular medical conditions.  Her blood pressure readings have been much better at home.  She did have to travel a long way today on a scooter with her boyfriend.  She states that she was quite anxious when she got here.  She is currently seeing psychiatry.  An appointment will be in the next few weeks.  She did have admission for her bipolar disorder.  Her medications are updated.  We will send refills in for her.  She still continues with chronic pain of the left knee related to the cartilage and meniscus injury.  She has seen Dr. Aline Brochure in the past.  Due to the knee giving her more trouble she would like to go back and see him.  It is been sometime since she had arthroscopic surgery.  Past Medical History:  Diagnosis Date  . ADHD (attention deficit hyperactivity disorder)   . Anxiety and depression   . Arthritis   . Asthma   . Bipolar disorder ([place])   . Chronic back pain   . Chronic hip pain   . Chronic neck pain   . COPD (chronic obstructive pulmonary disease) ([place])   . DDD (degenerative disc disease)   . Fibromyalgia   . Headache(784.0)   . High cholesterol   . Hypertension   . Pneumonia   . PTSD (post-traumatic stress disorder)   . Sciatica   . Seizure ([place])    scar tissue " on Brain". last seizure 11/2012  . Stroke ([place])   . Type 2 diabetes mellitus (HCC)    Relevant past medical, surgical, family and social history reviewed and updated as indicated. Interim medical history since our last visit reviewed. Allergies and medications reviewed and updated. DATA REVIEWED: CHART IN EPIC  Family History reviewed for  pertinent findings.  Review of Systems  Constitutional: Negative.  Negative for activity change, fatigue and fever.  HENT: Negative.   Eyes: Negative.   Respiratory: Negative.  Negative for cough.   Cardiovascular: Negative.  Negative for chest pain.  Gastrointestinal: Negative.  Negative for abdominal pain.  Endocrine: Negative.   Genitourinary: Negative.  Negative for dysuria.  Musculoskeletal: Positive for arthralgias, joint swelling and myalgias.  Skin: Negative.   Neurological: Negative.     Allergies as of 11/16/2017      Reactions   Aripiprazole Other (See Comments)   Jerks, Locks up jaws.    Cephalexin Nausea And Vomiting   Haldol [haloperidol Decanoate] Swelling, Other (See Comments)   Locks up jaws   Tegretol [carbamazepine] Other (See Comments)   Blisters, Sores   Zofran Other (See Comments)   Locks up jaw, restlessness    Ketorolac Tromethamine Rash      Medication List        Accurate as of 11/16/17 11:59 PM. Always use your most recent med list.          albuterol 108 (90 Base) MCG/ACT inhaler Commonly known as:  PROVENTIL HFA;VENTOLIN HFA Inhale 2 puffs into the lungs  every 6 (six) hours as needed for wheezing or shortness of breath.   albuterol (2.5 MG/3ML) 0.083% nebulizer solution Commonly known as:  PROVENTIL Take 3 mLs (2.5 mg total) by nebulization every 6 (six) hours as needed for wheezing or shortness of breath.   amLODipine 10 MG tablet Commonly known as:  NORVASC Take 1 tablet (10 mg total) by mouth daily.   diazepam 10 MG tablet Commonly known as:  VALIUM Take 0.5 tablets (5 mg total) by mouth every 8 (eight) hours as needed for anxiety.   FLUoxetine 40 MG capsule Commonly known as:  PROZAC Take 2 capsules (80 mg total) by mouth daily.   Fluticasone-Umeclidin-Vilant 100-62.5-25 MCG/INH Aepb Inhale 1 Inhaler into the lungs daily. written by Dr. Luan Pulling   furosemide 40 MG tablet Commonly known as:  LASIX Take 1 tablet (40 mg total)  by mouth daily.   gabapentin 800 MG tablet Commonly known as:  NEURONTIN Take 1 tablet (800 mg total) by mouth 4 (four) times daily.   glucose blood test strip Use as instructed   guaiFENesin 600 MG 12 hr tablet Commonly known as:  MUCINEX Take 1 tablet (600 mg total) by mouth 2 (two) times daily.   insulin aspart 100 UNIT/ML FlexPen Commonly known as:  NOVOLOG Inject 10 Units into the skin 3 (three) times daily with meals.   Insulin Glargine 100 UNIT/ML Solostar Pen Commonly known as:  LANTUS Inject 40 Units into the skin daily.   Insulin Pen Needle 31G X 5 MM Misc Use TID with Insulin   lamoTRIgine 25 MG tablet Commonly known as:  LAMICTAL Take 1 tablet (25 mg total) by mouth 2 (two) times daily.   mirabegron ER 50 MG Tb24 tablet Commonly known as:  MYRBETRIQ Take 1 tablet (50 mg total) by mouth daily.   OLANZapine 5 MG tablet Commonly known as:  ZYPREXA Take 5 mg by mouth at bedtime.   onetouch ultrasoft lancets Use as instructed   ONETOUCH VERIO w/Device Kit 1 kit by Does not apply route 2 (two) times daily.   OXYGEN Inhale 2 L into the lungs daily as needed.   potassium chloride SA 20 MEQ tablet Commonly known as:  K-DUR,KLOR-CON Take 1 tablet (20 mEq total) by mouth daily.   SUBOXONE 8-2 MG Film Generic drug:  Buprenorphine HCl-Naloxone HCl Take 1 Film by mouth 3 (three) times daily.   ziprasidone 80 MG capsule Commonly known as:  GEODON Take 1 capsule (80 mg total) by mouth 2 (two) times daily with a meal.          Objective:    BP (!) 150/101   Pulse 99   Temp 99.3 F (37.4 C) (Oral)   Ht '5\' 7"'$  (1.702 m)   Wt 242 lb 3.2 oz (109.9 kg)   BMI 37.93 kg/m   Allergies  Allergen Reactions  . Aripiprazole Other (See Comments)    Jerks, Locks up jaws.   . Cephalexin Nausea And Vomiting  . Haldol [Haloperidol Decanoate] Swelling and Other (See Comments)    Locks up jaws  . Tegretol [Carbamazepine] Other (See Comments)    Blisters, Sores  .  Zofran Other (See Comments)    Locks up jaw, restlessness   . Ketorolac Tromethamine Rash    Wt Readings from Last 3 Encounters:  11/16/17 242 lb 3.2 oz (109.9 kg)  09/15/17 236 lb 6.4 oz (107.2 kg)  06/26/17 229 lb 12.8 oz (104.2 kg)    Physical Exam  Constitutional: She is oriented  to person, place, and time. She appears well-developed and well-nourished.  HENT:  Head: Normocephalic and atraumatic.  Eyes: Pupils are equal, round, and reactive to light. Conjunctivae and EOM are normal.  Cardiovascular: Normal rate, regular rhythm, normal heart sounds and intact distal pulses.  Pulmonary/Chest: Effort normal and breath sounds normal.  Abdominal: Soft. Bowel sounds are normal.  Musculoskeletal:       Right knee: She exhibits decreased range of motion. She exhibits no swelling and no deformity. No tenderness found.       Left knee: She exhibits decreased range of motion and swelling. Tenderness found.  Neurological: She is alert and oriented to person, place, and time. She has normal reflexes.  Skin: Skin is warm and dry. No rash noted.  Psychiatric: She has a normal mood and affect. Her behavior is normal. Judgment and thought content normal.    Results for orders placed or performed during the hospital encounter of 05/25/17  MRSA PCR Screening  Result Value Ref Range   MRSA by PCR NEGATIVE NEGATIVE  CBC with Differential  Result Value Ref Range   WBC 17.5 (H) 4.0 - 10.5 K/uL   RBC 4.87 3.87 - 5.11 MIL/uL   Hemoglobin 14.0 12.0 - 15.0 g/dL   HCT 41.5 36.0 - 46.0 %   MCV 85.2 78.0 - 100.0 fL   MCH 28.7 26.0 - 34.0 pg   MCHC 33.7 30.0 - 36.0 g/dL   RDW 13.4 11.5 - 15.5 %   Platelets 198 150 - 400 K/uL   Neutrophils Relative % 87 %   Neutro Abs 15.1 (H) 1.7 - 7.7 K/uL   Lymphocytes Relative 10 %   Lymphs Abs 1.8 0.7 - 4.0 K/uL   Monocytes Relative 3 %   Monocytes Absolute 0.5 0.1 - 1.0 K/uL   Eosinophils Relative 0 %   Eosinophils Absolute 0.1 0.0 - 0.7 K/uL   Basophils  Relative 0 %   Basophils Absolute 0.0 0.0 - 0.1 K/uL  Basic metabolic panel  Result Value Ref Range   Sodium 133 (L) 135 - 145 mmol/L   Potassium 3.0 (L) 3.5 - 5.1 mmol/L   Chloride 96 (L) 101 - 111 mmol/L   CO2 22 22 - 32 mmol/L   Glucose, Bld 210 (H) 65 - 99 mg/dL   BUN 5 (L) 6 - 20 mg/dL   Creatinine, Ser 0.70 0.44 - 1.00 mg/dL   Calcium 8.8 (L) 8.9 - 10.3 mg/dL   GFR calc non Af Amer >60 >60 mL/min   GFR calc Af Amer >60 >60 mL/min   Anion gap 15 5 - 15  Magnesium  Result Value Ref Range   Magnesium 1.3 (L) 1.7 - 2.4 mg/dL  Phosphorus  Result Value Ref Range   Phosphorus 2.9 2.5 - 4.6 mg/dL  Strep pneumoniae urinary antigen  Result Value Ref Range   Strep Pneumo Urinary Antigen NEGATIVE NEGATIVE  CBC WITH DIFFERENTIAL  Result Value Ref Range   WBC 14.5 (H) 4.0 - 10.5 K/uL   RBC 4.91 3.87 - 5.11 MIL/uL   Hemoglobin 14.5 12.0 - 15.0 g/dL   HCT 42.5 36.0 - 46.0 %   MCV 86.6 78.0 - 100.0 fL   MCH 29.5 26.0 - 34.0 pg   MCHC 34.1 30.0 - 36.0 g/dL   RDW 13.5 11.5 - 15.5 %   Platelets 180 150 - 400 K/uL   Neutrophils Relative % 90 %   Neutro Abs 13.0 (H) 1.7 - 7.7 K/uL   Lymphocytes Relative 9 %  Lymphs Abs 1.3 0.7 - 4.0 K/uL   Monocytes Relative 1 %   Monocytes Absolute 0.1 0.1 - 1.0 K/uL   Eosinophils Relative 0 %   Eosinophils Absolute 0.0 0.0 - 0.7 K/uL   Basophils Relative 0 %   Basophils Absolute 0.0 0.0 - 0.1 K/uL  Blood gas, arterial  Result Value Ref Range   FIO2 44.00    Delivery systems NASAL CANNULA    pH, Arterial 7.383 7.350 - 7.450   pCO2 arterial 40.2 32.0 - 48.0 mmHg   pO2, Arterial 51.4 (L) 83.0 - 108.0 mmHg   Bicarbonate 23.2 20.0 - 28.0 mmol/L   Acid-base deficit 1.0 0.0 - 2.0 mmol/L   O2 Saturation 84.0 %   Patient temperature 37.0    Collection site RIGHT RADIAL    Drawn by [id number]    Sample type ARTERIAL DRAW    Allens test (pass/fail) PASS PASS  Urine rapid drug screen (hosp performed)  Result Value Ref Range   Opiates NONE DETECTED  NONE DETECTED   Cocaine NONE DETECTED NONE DETECTED   Benzodiazepines POSITIVE (A) NONE DETECTED   Amphetamines NONE DETECTED NONE DETECTED   Tetrahydrocannabinol POSITIVE (A) NONE DETECTED   Barbiturates NONE DETECTED NONE DETECTED  Influenza panel by PCR (type A & B)  Result Value Ref Range   Influenza A By PCR NEGATIVE NEGATIVE   Influenza B By PCR NEGATIVE NEGATIVE  Glucose, capillary  Result Value Ref Range   Glucose-Capillary 264 (H) 65 - 99 mg/dL  Glucose, capillary  Result Value Ref Range   Glucose-Capillary 282 (H) 65 - 99 mg/dL   Comment 1 Notify RN   Hemoglobin A1c  Result Value Ref Range   Hgb A1c MFr Bld 9.9 (H) 4.8 - 5.6 %   Mean Plasma Glucose 237.43 mg/dL  Glucose, capillary  Result Value Ref Range   Glucose-Capillary 309 (H) 65 - 99 mg/dL  Glucose, capillary  Result Value Ref Range   Glucose-Capillary 285 (H) 65 - 99 mg/dL  Comprehensive metabolic panel  Result Value Ref Range   Sodium 137 135 - 145 mmol/L   Potassium 4.0 3.5 - 5.1 mmol/L   Chloride 100 (L) 101 - 111 mmol/L   CO2 23 22 - 32 mmol/L   Glucose, Bld 262 (H) 65 - 99 mg/dL   BUN 11 6 - 20 mg/dL   Creatinine, Ser 0.69 0.44 - 1.00 mg/dL   Calcium 9.1 8.9 - 10.3 mg/dL   Total Protein 7.4 6.5 - 8.1 g/dL   Albumin 3.4 (L) 3.5 - 5.0 g/dL   AST 41 15 - 41 U/L   ALT 30 14 - 54 U/L   Alkaline Phosphatase 100 38 - 126 U/L   Total Bilirubin 0.6 0.3 - 1.2 mg/dL   GFR calc non Af Amer >60 >60 mL/min   GFR calc Af Amer >60 >60 mL/min   Anion gap 14 5 - 15  Magnesium  Result Value Ref Range   Magnesium 2.1 1.7 - 2.4 mg/dL  Magnesium  Result Value Ref Range   Magnesium 2.1 1.7 - 2.4 mg/dL  Comprehensive metabolic panel  Result Value Ref Range   Sodium 143 135 - 145 mmol/L   Potassium 4.1 3.5 - 5.1 mmol/L   Chloride 104 101 - 111 mmol/L   CO2 27 22 - 32 mmol/L   Glucose, Bld 197 (H) 65 - 99 mg/dL   BUN 11 6 - 20 mg/dL   Creatinine, Ser 0.59 0.44 - 1.00 mg/dL   Calcium  9.0 8.9 - 10.3 mg/dL    Total Protein 7.1 6.5 - 8.1 g/dL   Albumin 3.2 (L) 3.5 - 5.0 g/dL   AST 30 15 - 41 U/L   ALT 24 14 - 54 U/L   Alkaline Phosphatase 93 38 - 126 U/L   Total Bilirubin 0.4 0.3 - 1.2 mg/dL   GFR calc non Af Amer >60 >60 mL/min   GFR calc Af Amer >60 >60 mL/min   Anion gap 12 5 - 15  CBC  Result Value Ref Range   WBC 13.1 (H) 4.0 - 10.5 K/uL   RBC 4.48 3.87 - 5.11 MIL/uL   Hemoglobin 12.8 12.0 - 15.0 g/dL   HCT 40.2 36.0 - 46.0 %   MCV 89.7 78.0 - 100.0 fL   MCH 28.6 26.0 - 34.0 pg   MCHC 31.8 30.0 - 36.0 g/dL   RDW 14.0 11.5 - 15.5 %   Platelets 202 150 - 400 K/uL  Glucose, capillary  Result Value Ref Range   Glucose-Capillary 224 (H) 65 - 99 mg/dL  Glucose, capillary  Result Value Ref Range   Glucose-Capillary 251 (H) 65 - 99 mg/dL  Glucose, capillary  Result Value Ref Range   Glucose-Capillary 179 (H) 65 - 99 mg/dL  Glucose, capillary  Result Value Ref Range   Glucose-Capillary 175 (H) 65 - 99 mg/dL  Glucose, capillary  Result Value Ref Range   Glucose-Capillary 275 (H) 65 - 99 mg/dL  CBC with Differential/Platelet  Result Value Ref Range   WBC 9.1 4.0 - 10.5 K/uL   RBC 4.65 3.87 - 5.11 MIL/uL   Hemoglobin 13.3 12.0 - 15.0 g/dL   HCT 41.0 36.0 - 46.0 %   MCV 88.2 78.0 - 100.0 fL   MCH 28.6 26.0 - 34.0 pg   MCHC 32.4 30.0 - 36.0 g/dL   RDW 13.5 11.5 - 15.5 %   Platelets 208 150 - 400 K/uL   Neutrophils Relative % 85 %   Neutro Abs 7.8 (H) 1.7 - 7.7 K/uL   Lymphocytes Relative 11 %   Lymphs Abs 1.0 0.7 - 4.0 K/uL   Monocytes Relative 4 %   Monocytes Absolute 0.3 0.1 - 1.0 K/uL   Eosinophils Relative 0 %   Eosinophils Absolute 0.0 0.0 - 0.7 K/uL   Basophils Relative 0 %   Basophils Absolute 0.0 0.0 - 0.1 K/uL  Comprehensive metabolic panel  Result Value Ref Range   Sodium 137 135 - 145 mmol/L   Potassium 3.6 3.5 - 5.1 mmol/L   Chloride 93 (L) 101 - 111 mmol/L   CO2 28 22 - 32 mmol/L   Glucose, Bld 307 (H) 65 - 99 mg/dL   BUN 19 6 - 20 mg/dL   Creatinine, Ser  0.63 0.44 - 1.00 mg/dL   Calcium 9.3 8.9 - 10.3 mg/dL   Total Protein 7.5 6.5 - 8.1 g/dL   Albumin 3.3 (L) 3.5 - 5.0 g/dL   AST 20 15 - 41 U/L   ALT 23 14 - 54 U/L   Alkaline Phosphatase 106 38 - 126 U/L   Total Bilirubin 0.6 0.3 - 1.2 mg/dL   GFR calc non Af Amer >60 >60 mL/min   GFR calc Af Amer >60 >60 mL/min   Anion gap 16 (H) 5 - 15  Magnesium  Result Value Ref Range   Magnesium 2.0 1.7 - 2.4 mg/dL  Glucose, capillary  Result Value Ref Range   Glucose-Capillary 271 (H) 65 - 99 mg/dL  Glucose,  capillary  Result Value Ref Range   Glucose-Capillary 365 (H) 65 - 99 mg/dL  Glucose, capillary  Result Value Ref Range   Glucose-Capillary 235 (H) 65 - 99 mg/dL  Glucose, capillary  Result Value Ref Range   Glucose-Capillary 255 (H) 65 - 99 mg/dL  Glucose, capillary  Result Value Ref Range   Glucose-Capillary 155 (H) 65 - 99 mg/dL  Basic metabolic panel  Result Value Ref Range   Sodium 141 135 - 145 mmol/L   Potassium 4.1 3.5 - 5.1 mmol/L   Chloride 94 (L) 101 - 111 mmol/L   CO2 28 22 - 32 mmol/L   Glucose, Bld 214 (H) 65 - 99 mg/dL   BUN 24 (H) 6 - 20 mg/dL   Creatinine, Ser 0.68 0.44 - 1.00 mg/dL   Calcium 9.3 8.9 - 10.3 mg/dL   GFR calc non Af Amer >60 >60 mL/min   GFR calc Af Amer >60 >60 mL/min   Anion gap 19 (H) 5 - 15  CBC  Result Value Ref Range   WBC 13.0 (H) 4.0 - 10.5 K/uL   RBC 4.80 3.87 - 5.11 MIL/uL   Hemoglobin 13.7 12.0 - 15.0 g/dL   HCT 41.9 36.0 - 46.0 %   MCV 87.3 78.0 - 100.0 fL   MCH 28.5 26.0 - 34.0 pg   MCHC 32.7 30.0 - 36.0 g/dL   RDW 13.1 11.5 - 15.5 %   Platelets 217 150 - 400 K/uL  Magnesium  Result Value Ref Range   Magnesium 2.1 1.7 - 2.4 mg/dL  Glucose, capillary  Result Value Ref Range   Glucose-Capillary 210 (H) 65 - 99 mg/dL   Comment 1 Notify RN    Comment 2 Document in Chart   Glucose, capillary  Result Value Ref Range   Glucose-Capillary 218 (H) 65 - 99 mg/dL   Comment 1 Notify RN    Comment 2 Document in Chart   Glucose,  capillary  Result Value Ref Range   Glucose-Capillary 240 (H) 65 - 99 mg/dL  Blood gas, arterial  Result Value Ref Range   FIO2 0.90    O2 Content 30.0 L/min   Delivery systems HI FLOW NASAL CANNULA    pH, Arterial 7.467 (H) 7.350 - 7.450   pCO2 arterial 47.3 32.0 - 48.0 mmHg   pO2, Arterial 110 (H) 83.0 - 108.0 mmHg   Bicarbonate 32.6 (H) 20.0 - 28.0 mmol/L   Acid-Base Excess 9.6 (H) 0.0 - 2.0 mmol/L   O2 Saturation 98.1 %   Patient temperature 37.0    Collection site LEFT RADIAL    Drawn by [id number]    Sample type ARTERIAL DRAW    Allens test (pass/fail) PASS PASS  Glucose, capillary  Result Value Ref Range   Glucose-Capillary 171 (H) 65 - 99 mg/dL  Glucose, capillary  Result Value Ref Range   Glucose-Capillary 98 65 - 99 mg/dL  Basic metabolic panel  Result Value Ref Range   Sodium 137 135 - 145 mmol/L   Potassium 4.0 3.5 - 5.1 mmol/L   Chloride 92 (L) 101 - 111 mmol/L   CO2 30 22 - 32 mmol/L   Glucose, Bld 244 (H) 65 - 99 mg/dL   BUN 30 (H) 6 - 20 mg/dL   Creatinine, Ser 0.71 0.44 - 1.00 mg/dL   Calcium 9.5 8.9 - 10.3 mg/dL   GFR calc non Af Amer >60 >60 mL/min   GFR calc Af Amer >60 >60 mL/min   Anion gap 15 5 -  15  Magnesium  Result Value Ref Range   Magnesium 2.1 1.7 - 2.4 mg/dL  Glucose, capillary  Result Value Ref Range   Glucose-Capillary 223 (H) 65 - 99 mg/dL   Comment 1 Notify RN   Glucose, capillary  Result Value Ref Range   Glucose-Capillary 243 (H) 65 - 99 mg/dL  Glucose, capillary  Result Value Ref Range   Glucose-Capillary 227 (H) 65 - 99 mg/dL  Glucose, capillary  Result Value Ref Range   Glucose-Capillary 62 (L) 65 - 99 mg/dL   Comment 1 Notify RN   Glucose, capillary  Result Value Ref Range   Glucose-Capillary 222 (H) 65 - 99 mg/dL  Glucose, capillary  Result Value Ref Range   Glucose-Capillary 87 65 - 99 mg/dL  Basic metabolic panel  Result Value Ref Range   Sodium 138 135 - 145 mmol/L   Potassium 3.8 3.5 - 5.1 mmol/L   Chloride 92  (L) 101 - 111 mmol/L   CO2 31 22 - 32 mmol/L   Glucose, Bld 98 65 - 99 mg/dL   BUN 24 (H) 6 - 20 mg/dL   Creatinine, Ser 0.77 0.44 - 1.00 mg/dL   Calcium 9.3 8.9 - 10.3 mg/dL   GFR calc non Af Amer >60 >60 mL/min   GFR calc Af Amer >60 >60 mL/min   Anion gap 15 5 - 15  Magnesium  Result Value Ref Range   Magnesium 2.2 1.7 - 2.4 mg/dL  Glucose, capillary  Result Value Ref Range   Glucose-Capillary 133 (H) 65 - 99 mg/dL   Comment 1 Notify RN   Glucose, capillary  Result Value Ref Range   Glucose-Capillary 88 65 - 99 mg/dL   Comment 1 Notify RN   Glucose, capillary  Result Value Ref Range   Glucose-Capillary 132 (H) 65 - 99 mg/dL  Glucose, capillary  Result Value Ref Range   Glucose-Capillary 213 (H) 65 - 99 mg/dL  Brain natriuretic peptide  Result Value Ref Range   B Natriuretic Peptide 6.0 0.0 - 100.0 pg/mL  Glucose, capillary  Result Value Ref Range   Glucose-Capillary 150 (H) 65 - 99 mg/dL  Basic metabolic panel  Result Value Ref Range   Sodium 134 (L) 135 - 145 mmol/L   Potassium 4.6 3.5 - 5.1 mmol/L   Chloride 93 (L) 101 - 111 mmol/L   CO2 31 22 - 32 mmol/L   Glucose, Bld 311 (H) 65 - 99 mg/dL   BUN 22 (H) 6 - 20 mg/dL   Creatinine, Ser 0.66 0.44 - 1.00 mg/dL   Calcium 9.4 8.9 - 10.3 mg/dL   GFR calc non Af Amer >60 >60 mL/min   GFR calc Af Amer >60 >60 mL/min   Anion gap 10 5 - 15  Magnesium  Result Value Ref Range   Magnesium 2.1 1.7 - 2.4 mg/dL  Glucose, capillary  Result Value Ref Range   Glucose-Capillary 232 (H) 65 - 99 mg/dL  Glucose, capillary  Result Value Ref Range   Glucose-Capillary 255 (H) 65 - 99 mg/dL  Glucose, capillary  Result Value Ref Range   Glucose-Capillary 259 (H) 65 - 99 mg/dL  Glucose, capillary  Result Value Ref Range   Glucose-Capillary 292 (H) 65 - 99 mg/dL  Glucose, capillary  Result Value Ref Range   Glucose-Capillary 109 (H) 65 - 99 mg/dL  Glucose, capillary  Result Value Ref Range   Glucose-Capillary 93 65 - 99 mg/dL    Basic metabolic panel  Result Value Ref  Range   Sodium 138 135 - 145 mmol/L   Potassium 4.2 3.5 - 5.1 mmol/L   Chloride 93 (L) 101 - 111 mmol/L   CO2 32 22 - 32 mmol/L   Glucose, Bld 121 (H) 65 - 99 mg/dL   BUN 24 (H) 6 - 20 mg/dL   Creatinine, Ser 0.66 0.44 - 1.00 mg/dL   Calcium 9.7 8.9 - 10.3 mg/dL   GFR calc non Af Amer >60 >60 mL/min   GFR calc Af Amer >60 >60 mL/min   Anion gap 13 5 - 15  Glucose, capillary  Result Value Ref Range   Glucose-Capillary 106 (H) 65 - 99 mg/dL  Glucose, capillary  Result Value Ref Range   Glucose-Capillary 121 (H) 65 - 99 mg/dL  Glucose, capillary  Result Value Ref Range   Glucose-Capillary 127 (H) 65 - 99 mg/dL  Glucose, capillary  Result Value Ref Range   Glucose-Capillary 129 (H) 65 - 99 mg/dL  Glucose, capillary  Result Value Ref Range   Glucose-Capillary 99 65 - 99 mg/dL  Glucose, capillary  Result Value Ref Range   Glucose-Capillary 132 (H) 65 - 99 mg/dL   Comment 1 Notify RN   Glucose, capillary  Result Value Ref Range   Glucose-Capillary 104 (H) 65 - 99 mg/dL   Comment 1 Notify RN   Glucose, capillary  Result Value Ref Range   Glucose-Capillary 96 65 - 99 mg/dL  Glucose, capillary  Result Value Ref Range   Glucose-Capillary 145 (H) 65 - 99 mg/dL  Glucose, capillary  Result Value Ref Range   Glucose-Capillary 285 (H) 65 - 99 mg/dL  Glucose, capillary  Result Value Ref Range   Glucose-Capillary 113 (H) 65 - 99 mg/dL  Glucose, capillary  Result Value Ref Range   Glucose-Capillary 154 (H) 65 - 99 mg/dL  Glucose, capillary  Result Value Ref Range   Glucose-Capillary 122 (H) 65 - 99 mg/dL  Glucose, capillary  Result Value Ref Range   Glucose-Capillary 90 65 - 99 mg/dL  CBG monitoring, ED  Result Value Ref Range   Glucose-Capillary 328 (H) 65 - 99 mg/dL  CBG monitoring, ED  Result Value Ref Range   Glucose-Capillary 427 (H) 65 - 99 mg/dL  CBG monitoring, ED  Result Value Ref Range   Glucose-Capillary 361 (H) 65 -  99 mg/dL  ECHOCARDIOGRAM COMPLETE  Result Value Ref Range   Weight 3,470.92 oz   Height 67 in   BP 124/66 mmHg      Assessment & Plan:   1. Bipolar affective disorder, remission status unspecified (HCC) - FLUoxetine (PROZAC) 40 MG capsule; Take 2 capsules (80 mg total) by mouth daily.  Dispense: 60 capsule; Refill: 1  2. Chronic pain of left knee - Ambulatory referral to Orthopedic Surgery   Continue all other maintenance medications as listed above.  Follow up plan: Return in about 2 months (around 01/16/2018) for recheck and labs.  Educational handout given for [place] PA-C Gamaliel [address]  [place], [place] [address] [phone]   11/17/2017, 11:33 PM

## 2017-11-21 DIAGNOSIS — Z79899 Other long term (current) drug therapy: Secondary | ICD-10-CM | POA: Diagnosis not present

## 2017-11-21 DIAGNOSIS — S8992XA Unspecified injury of left lower leg, initial encounter: Secondary | ICD-10-CM | POA: Diagnosis not present

## 2017-11-21 DIAGNOSIS — S79912A Unspecified injury of left hip, initial encounter: Secondary | ICD-10-CM | POA: Diagnosis not present

## 2017-11-21 DIAGNOSIS — M7989 Other specified soft tissue disorders: Secondary | ICD-10-CM | POA: Diagnosis not present

## 2017-11-21 DIAGNOSIS — S80212A Abrasion, left knee, initial encounter: Secondary | ICD-10-CM | POA: Diagnosis not present

## 2017-11-21 DIAGNOSIS — J45909 Unspecified asthma, uncomplicated: Secondary | ICD-10-CM | POA: Diagnosis not present

## 2017-11-21 DIAGNOSIS — Z23 Encounter for immunization: Secondary | ICD-10-CM | POA: Diagnosis not present

## 2017-11-21 DIAGNOSIS — M25552 Pain in left hip: Secondary | ICD-10-CM | POA: Diagnosis not present

## 2017-11-21 DIAGNOSIS — M25462 Effusion, left knee: Secondary | ICD-10-CM | POA: Diagnosis not present

## 2017-11-21 DIAGNOSIS — S70212A Abrasion, left hip, initial encounter: Secondary | ICD-10-CM | POA: Diagnosis not present

## 2017-11-21 DIAGNOSIS — M25562 Pain in left knee: Secondary | ICD-10-CM | POA: Diagnosis not present

## 2017-11-23 ENCOUNTER — Other Ambulatory Visit: Payer: Self-pay | Admitting: Physician Assistant

## 2017-11-23 DIAGNOSIS — J441 Chronic obstructive pulmonary disease with (acute) exacerbation: Secondary | ICD-10-CM

## 2017-12-01 ENCOUNTER — Other Ambulatory Visit: Payer: Self-pay | Admitting: Physician Assistant

## 2017-12-01 DIAGNOSIS — K739 Chronic hepatitis, unspecified: Secondary | ICD-10-CM | POA: Diagnosis not present

## 2017-12-01 NOTE — Progress Notes (Unsigned)
This patient came in for chronic left ankle and foot pain. He works as a Engineer, civil (consulting)

## 2017-12-08 DIAGNOSIS — J449 Chronic obstructive pulmonary disease, unspecified: Secondary | ICD-10-CM | POA: Diagnosis not present

## 2017-12-10 ENCOUNTER — Other Ambulatory Visit: Payer: Self-pay | Admitting: Physician Assistant

## 2017-12-10 NOTE — Telephone Encounter (Signed)
Last seen 11/16/17

## 2017-12-14 ENCOUNTER — Telehealth: Payer: Self-pay | Admitting: Physician Assistant

## 2017-12-14 ENCOUNTER — Other Ambulatory Visit: Payer: Self-pay

## 2017-12-14 DIAGNOSIS — F319 Bipolar disorder, unspecified: Secondary | ICD-10-CM

## 2017-12-14 DIAGNOSIS — Z8659 Personal history of other mental and behavioral disorders: Secondary | ICD-10-CM

## 2017-12-14 MED ORDER — DIAZEPAM 10 MG PO TABS: 5.0000 mg | ORAL_TABLET | Freq: Three times a day (TID) | ORAL | 0 refills | Status: DC | PRN

## 2017-12-14 NOTE — Telephone Encounter (Signed)
Patient aware and referral placed.

## 2017-12-14 NOTE — Telephone Encounter (Signed)
Refer to psychiatry, urgent

## 2017-12-28 ENCOUNTER — Ambulatory Visit (INDEPENDENT_AMBULATORY_CARE_PROVIDER_SITE_OTHER): Payer: Medicare Other | Admitting: Orthopedic Surgery

## 2017-12-28 ENCOUNTER — Encounter: Payer: Self-pay | Admitting: Orthopedic Surgery

## 2017-12-28 VITALS — BP 112/72 | HR 88 | Ht 67.0 in | Wt 241.0 lb

## 2017-12-28 DIAGNOSIS — M1710 Unilateral primary osteoarthritis, unspecified knee: Secondary | ICD-10-CM

## 2017-12-28 DIAGNOSIS — M238X2 Other internal derangements of left knee: Secondary | ICD-10-CM | POA: Diagnosis not present

## 2017-12-28 DIAGNOSIS — M25362 Other instability, left knee: Secondary | ICD-10-CM | POA: Diagnosis not present
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
## 2017-12-29 DIAGNOSIS — K739 Chronic hepatitis, unspecified: Secondary | ICD-10-CM | POA: Diagnosis not present

## 2017-12-30 ENCOUNTER — Ambulatory Visit (INDEPENDENT_AMBULATORY_CARE_PROVIDER_SITE_OTHER): Payer: Medicare Other | Admitting: Physician Assistant

## 2017-12-30 ENCOUNTER — Encounter: Payer: Self-pay | Admitting: Physician Assistant

## 2017-12-30 VITALS — BP 119/77 | HR 84 | Temp 98.3°F | Ht 67.0 in | Wt 239.6 lb

## 2017-12-30 DIAGNOSIS — Z8659 Personal history of other mental and behavioral disorders: Secondary | ICD-10-CM | POA: Diagnosis not present

## 2017-12-30 DIAGNOSIS — K2100 Gastro-esophageal reflux disease with esophagitis, without bleeding: Secondary | ICD-10-CM

## 2017-12-30 DIAGNOSIS — Z23 Encounter for immunization: Secondary | ICD-10-CM | POA: Diagnosis not present

## 2017-12-30 DIAGNOSIS — E1065 Type 1 diabetes mellitus with hyperglycemia: Secondary | ICD-10-CM | POA: Diagnosis not present

## 2017-12-30 DIAGNOSIS — F319 Bipolar disorder, unspecified: Secondary | ICD-10-CM | POA: Diagnosis not present

## 2017-12-30 MED ORDER — PANTOPRAZOLE SODIUM 40 MG PO TBEC: 40.0000 mg | DELAYED_RELEASE_TABLET | Freq: Every day | ORAL | 11 refills | Status: AC

## 2017-12-30 MED ORDER — FLUOXETINE HCL 40 MG PO CAPS: 80.0000 mg | ORAL_CAPSULE | Freq: Every day | ORAL | 5 refills | Status: AC

## 2017-12-30 MED ORDER — LISINOPRIL 2.5 MG PO TABS: 2.5000 mg | ORAL_TABLET | Freq: Every day | ORAL | 0 refills | Status: DC

## 2017-12-30 MED ORDER — DIAZEPAM 10 MG PO TABS: 5.0000 mg | ORAL_TABLET | Freq: Three times a day (TID) | ORAL | 2 refills | Status: DC | PRN

## 2017-12-30 MED ORDER — LAMOTRIGINE 25 MG PO TABS: 25.0000 mg | ORAL_TABLET | Freq: Two times a day (BID) | ORAL | 5 refills | Status: AC

## 2017-12-31 ENCOUNTER — Telehealth: Payer: Self-pay | Admitting: Radiology

## 2017-12-31 LAB — CBC WITH DIFFERENTIAL/PLATELET
Basophils Absolute: 0 10*3/uL (ref 0.0–0.2)
Basos: 0 %
EOS (ABSOLUTE): 0.2 10*3/uL (ref 0.0–0.4)
Eos: 2 %
HEMOGLOBIN: 14.1 g/dL (ref 11.1–15.9)
Hematocrit: 42.1 % (ref 34.0–46.6)
IMMATURE GRANS (ABS): 0.1 10*3/uL (ref 0.0–0.1)
IMMATURE GRANULOCYTES: 1 %
LYMPHS: 30 %
Lymphocytes Absolute: 3 10*3/uL (ref 0.7–3.1)
MCH: 28.7 pg (ref 26.6–33.0)
MCHC: 33.5 g/dL (ref 31.5–35.7)
MCV: 86 fL (ref 79–97)
MONOCYTES: 5 %
Monocytes Absolute: 0.5 10*3/uL (ref 0.1–0.9)
Neutrophils Absolute: 6.3 10*3/uL (ref 1.4–7.0)
Neutrophils: 62 %
PLATELETS: 229 10*3/uL (ref 150–450)
RBC: 4.92 x10E6/uL (ref 3.77–5.28)
RDW: 14.5 % (ref 12.3–15.4)
WBC: 10.1 10*3/uL (ref 3.4–10.8)

## 2017-12-31 LAB — CMP14+EGFR
A/G RATIO: 1.5 (ref 1.2–2.2)
ALT: 17 IU/L (ref 0–32)
AST: 19 IU/L (ref 0–40)
Albumin: 4.2 g/dL (ref 3.5–5.5)
Alkaline Phosphatase: 102 IU/L (ref 39–117)
BUN/Creatinine Ratio: 17 (ref 9–23)
BUN: 13 mg/dL (ref 6–24)
CALCIUM: 9.5 mg/dL (ref 8.7–10.2)
CHLORIDE: 96 mmol/L (ref 96–106)
CO2: 27 mmol/L (ref 20–29)
Creatinine, Ser: 0.76 mg/dL (ref 0.57–1.00)
GFR, EST AFRICAN AMERICAN: 113 mL/min/{1.73_m2} (ref >59)
GFR, EST NON AFRICAN AMERICAN: 98 mL/min/{1.73_m2} (ref >59)
GLOBULIN, TOTAL: 2.8 g/dL (ref 1.5–4.5)
Glucose: 239 mg/dL — ABNORMAL HIGH (ref 65–99)
Potassium: 4.3 mmol/L (ref 3.5–5.2)
SODIUM: 139 mmol/L (ref 134–144)
TOTAL PROTEIN: 7 g/dL (ref 6.0–8.5)

## 2017-12-31 LAB — MICROALBUMIN / CREATININE URINE RATIO
Creatinine, Urine: 128.9 mg/dL
Microalb/Creat Ratio: 137.2 mg/g creat — ABNORMAL HIGH (ref 0.0–30.0)
Microalbumin, Urine: 176.8 ug/mL

## 2017-12-31 LAB — LIPID PANEL
CHOLESTEROL TOTAL: 201 mg/dL — AB (ref 100–199)
Chol/HDL Ratio: 5.4 ratio — ABNORMAL HIGH (ref 0.0–4.4)
HDL: 37 mg/dL — AB (ref >39)
Triglycerides: 432 mg/dL — ABNORMAL HIGH (ref 0–149)

## 2017-12-31 NOTE — Telephone Encounter (Signed)
I faxed order to [facility] for her custom ACL brace

## 2018-01-03 NOTE — Progress Notes (Signed)
BP 119/77   Pulse 84   Temp 98.3 F (36.8 C) (Oral)   Ht '5\' 7"'$  (1.702 m)   Wt 239 lb 9.6 oz (108.7 kg)   BMI 37.53 kg/m    Subjective:    Patient ID: Latasha [name], female    DOB: [birth year], 42 y.o.   MRN: [id number]  HPI: Latasha [name] is a 42 y.o. female presenting on 12/30/2017 for Medical Management of Chronic Issues  This patient comes in for periodic recheck on medications and conditions including bipolar disorder, PTSD, borderline personality disorder, type 1 diabetes, GERD.  She is still awaiting appointment for psychiatry.  Her Suboxone clinic physicians are trying to get one for her.  When she was referred to the group.  Recommend candidate for that she was too difficult for them to handle.  At this time we will continue the medications that she was given at her last hospitalization.  And then we will plan for continued follow-up on her regular medical conditions.  All.   All medications are reviewed today. There are no reports of any problems with the medications. All of the medical conditions are reviewed and updated.  Lab work is reviewed and will be ordered as medically necessary. There are no new problems reported with today's visit.   Past Medical History:  Diagnosis Date  . ADHD (attention deficit hyperactivity disorder)   . Anxiety and depression   . Arthritis   . Asthma   . Bipolar disorder ([place])   . Chronic back pain   . Chronic hip pain   . Chronic neck pain   . COPD (chronic obstructive pulmonary disease) ([place])   . DDD (degenerative disc disease)   . Fibromyalgia   . Headache(784.0)   . High cholesterol   . Hypertension   . Pneumonia   . PTSD (post-traumatic stress disorder)   . Sciatica   . Seizure ([place])    scar tissue " on Brain". last seizure 11/2012  . Stroke ([place])   . Type 2 diabetes mellitus ([facility])    Relevant past medical, surgical, family and social history reviewed and updated as indicated. Interim medical history since our last visit  reviewed. Allergies and medications reviewed and updated. DATA REVIEWED: CHART IN EPIC  Family History reviewed for pertinent findings.  Review of Systems  Constitutional: Negative.   HENT: Negative.   Eyes: Negative.   Respiratory: Negative.   Gastrointestinal: Negative.   Genitourinary: Negative.     Allergies as of 12/30/2017      Reactions   Aripiprazole Other (See Comments)   Jerks, Locks up jaws.    Cephalexin Nausea And Vomiting   Haldol [haloperidol Decanoate] Swelling, Other (See Comments)   Locks up jaws   Tegretol [carbamazepine] Other (See Comments)   Blisters, Sores   Zofran Other (See Comments)   Locks up jaw, restlessness    Ketorolac Tromethamine Rash      Medication List        Accurate as of 12/30/17 11:59 PM. Always use your most recent med list.          albuterol (2.5 MG/3ML) 0.083% nebulizer solution Commonly known as:  PROVENTIL Take 3 mLs (2.5 mg total) by nebulization every 6 (six) hours as needed for wheezing or shortness of breath.   albuterol 108 (90 Base) MCG/ACT inhaler Commonly known as:  PROVENTIL HFA;VENTOLIN HFA INHALE 2 PUFFS INTO THE LUNGS EVERY 6 HOURS AS NEEDED FOR WHEEZING ORSHORTNESS OF BREATH.   amLODipine 10  MG tablet Commonly known as:  NORVASC Take 1 tablet (10 mg total) by mouth daily.   diazepam 10 MG tablet Commonly known as:  VALIUM Take 0.5 tablets (5 mg total) by mouth every 8 (eight) hours as needed for anxiety.   FLUoxetine 40 MG capsule Commonly known as:  PROZAC Take 2 capsules (80 mg total) by mouth daily.   gabapentin 800 MG tablet Commonly known as:  NEURONTIN Take 1 tablet (800 mg total) by mouth 4 (four) times daily.   glucose blood test strip Use as instructed   insulin aspart 100 UNIT/ML FlexPen Commonly known as:  NOVOLOG Inject 10 Units into the skin 3 (three) times daily with meals.   Insulin Glargine 100 UNIT/ML Solostar Pen Commonly known as:  LANTUS Inject 40 Units into the skin  daily.   Insulin Pen Needle 31G X 5 MM Misc Use TID with Insulin   lamoTRIgine 25 MG tablet Commonly known as:  LAMICTAL Take 1 tablet (25 mg total) by mouth 2 (two) times daily.   lisinopril 2.5 MG tablet Commonly known as:  PRINIVIL,ZESTRIL Take 1 tablet (2.5 mg total) by mouth daily.   onetouch ultrasoft lancets Use as instructed   ONETOUCH VERIO w/Device Kit 1 kit by Does not apply route 2 (two) times daily.   pantoprazole 40 MG tablet Commonly known as:  PROTONIX Take 1 tablet (40 mg total) by mouth daily.   promethazine 25 MG tablet Commonly known as:  PHENERGAN Take 25 mg by mouth every 8 (eight) hours as needed for nausea or vomiting.   SUBOXONE 8-2 MG Film Generic drug:  Buprenorphine HCl-Naloxone HCl Take 1 Film by mouth 3 (three) times daily.   ziprasidone 80 MG capsule Commonly known as:  GEODON Take 1 capsule (80 mg total) by mouth 2 (two) times daily with a meal.          Objective:    BP 119/77   Pulse 84   Temp 98.3 F (36.8 C) (Oral)   Ht '5\' 7"'$  (1.702 m)   Wt 239 lb 9.6 oz (108.7 kg)   BMI 37.53 kg/m   Allergies  Allergen Reactions  . Aripiprazole Other (See Comments)    Jerks, Locks up jaws.   . Cephalexin Nausea And Vomiting  . Haldol [Haloperidol Decanoate] Swelling and Other (See Comments)    Locks up jaws  . Tegretol [Carbamazepine] Other (See Comments)    Blisters, Sores  . Zofran Other (See Comments)    Locks up jaw, restlessness   . Ketorolac Tromethamine Rash    Wt Readings from Last 3 Encounters:  12/30/17 239 lb 9.6 oz (108.7 kg)  12/28/17 241 lb (109.3 kg)  11/16/17 242 lb 3.2 oz (109.9 kg)    Physical Exam  Constitutional: She is oriented to person, place, and time. She appears well-developed and well-nourished.  HENT:  Head: Normocephalic and atraumatic.  Eyes: Pupils are equal, round, and reactive to light. Conjunctivae and EOM are normal.  Cardiovascular: Normal rate, regular rhythm, normal heart sounds and  intact distal pulses.  Pulmonary/Chest: Effort normal and breath sounds normal.  Abdominal: Soft. Bowel sounds are normal.  Neurological: She is alert and oriented to person, place, and time. She has normal reflexes.  Skin: Skin is warm and dry. No rash noted.  Psychiatric: She has a normal mood and affect. Her behavior is normal. Judgment and thought content normal.    Results for orders placed or performed in visit on 12/30/17  CMP14+EGFR  Result Value  Ref Range   Glucose 239 (H) 65 - 99 mg/dL   BUN 13 6 - 24 mg/dL   Creatinine, Ser 0.76 0.57 - 1.00 mg/dL   GFR calc non Af Amer 98 >59 mL/min/1.73   GFR calc Af Amer 113 >59 mL/min/1.73   BUN/Creatinine Ratio 17 9 - 23   Sodium 139 134 - 144 mmol/L   Potassium 4.3 3.5 - 5.2 mmol/L   Chloride 96 96 - 106 mmol/L   CO2 27 20 - 29 mmol/L   Calcium 9.5 8.7 - 10.2 mg/dL   Total Protein 7.0 6.0 - 8.5 g/dL   Albumin 4.2 3.5 - 5.5 g/dL   Globulin, Total 2.8 1.5 - 4.5 g/dL   Albumin/Globulin Ratio 1.5 1.2 - 2.2   Bilirubin Total <0.2 0.0 - 1.2 mg/dL   Alkaline Phosphatase 102 39 - 117 IU/L   AST 19 0 - 40 IU/L   ALT 17 0 - 32 IU/L  Microalbumin / creatinine urine ratio  Result Value Ref Range   Creatinine, Urine 128.9 Not Estab. mg/dL   Microalbumin, Urine 176.8 Not Estab. ug/mL   Microalb/Creat Ratio 137.2 (H) 0.0 - 30.0 mg/g creat  CBC with Differential/Platelet  Result Value Ref Range   WBC 10.1 3.4 - 10.8 x10E3/uL   RBC 4.92 3.77 - 5.28 x10E6/uL   Hemoglobin 14.1 11.1 - 15.9 g/dL   Hematocrit 42.1 34.0 - 46.6 %   MCV 86 79 - 97 fL   MCH 28.7 26.6 - 33.0 pg   MCHC 33.5 31.5 - 35.7 g/dL   RDW 14.5 12.3 - 15.4 %   Platelets 229 150 - 450 x10E3/uL   Neutrophils 62 Not Estab. %   Lymphs 30 Not Estab. %   Monocytes 5 Not Estab. %   Eos 2 Not Estab. %   Basos 0 Not Estab. %   Neutrophils Absolute 6.3 1.4 - 7.0 x10E3/uL   Lymphocytes Absolute 3.0 0.7 - 3.1 x10E3/uL   Monocytes Absolute 0.5 0.1 - 0.9 x10E3/uL   EOS (ABSOLUTE)  0.2 0.0 - 0.4 x10E3/uL   Basophils Absolute 0.0 0.0 - 0.2 x10E3/uL   Immature Granulocytes 1 Not Estab. %   Immature Grans (Abs) 0.1 0.0 - 0.1 x10E3/uL  Lipid panel  Result Value Ref Range   Cholesterol, Total 201 (H) 100 - 199 mg/dL   Triglycerides 432 (H) 0 - 149 mg/dL   HDL 37 (L) >39 mg/dL   VLDL Cholesterol Cal Comment 5 - 40 mg/dL   LDL Calculated Comment 0 - 99 mg/dL   Chol/HDL Ratio 5.4 (H) 0.0 - 4.4 ratio      Assessment & Plan:   1. Bipolar affective disorder, remission status unspecified ([facility]) - diazepam (VALIUM) 10 MG tablet; Take 0.5 tablets (5 mg total) by mouth every 8 (eight) hours as needed for anxiety.  Dispense: 90 tablet; Refill: 2 - lamoTRIgine (LAMICTAL) 25 MG tablet; Take 1 tablet (25 mg total) by mouth 2 (two) times daily.  Dispense: 60 tablet; Refill: 5 - FLUoxetine (PROZAC) 40 MG capsule; Take 2 capsules (80 mg total) by mouth daily.  Dispense: 60 capsule; Refill: 5  2. History of posttraumatic stress disorder (PTSD) Continue with psychiatry  3. History of borderline personality disorder Continue with psychiatry  4. Type 1 diabetes mellitus with hyperglycemia ([facility]) - lisinopril (PRINIVIL,ZESTRIL) 2.5 MG tablet; Take 1 tablet (2.5 mg total) by mouth daily.  Dispense: 90 tablet; Refill: 0 - CMP14+EGFR - Microalbumin / creatinine urine ratio - CBC with Differential/Platelet - Lipid  panel  5. Gastroesophageal reflux disease with esophagitis - pantoprazole (PROTONIX) 40 MG tablet; Take 1 tablet (40 mg total) by mouth daily.  Dispense: 30 tablet; Refill: 11  6. Need for immunization against influenza - Flu Vaccine QUAD 36+ mos IM   Continue all other maintenance medications as listed above.  Follow up plan: Return in about 3 months (around 04/01/2018) for recheck.  Educational handout given for [place] PA-C [facility] [address]  [place], [place] [address] [phone]   01/03/2018, 10:17 PM

## 2018-01-08 DIAGNOSIS — J449 Chronic obstructive pulmonary disease, unspecified: Secondary | ICD-10-CM | POA: Diagnosis not present

## 2018-01-13 ENCOUNTER — Telehealth: Payer: Self-pay | Admitting: Orthopedic Surgery

## 2018-01-13 NOTE — Telephone Encounter (Signed)
I have called her to advise he will be in touch. Have sent him an email to check on this.

## 2018-01-13 NOTE — Telephone Encounter (Signed)
Patient called, said she has not heard back about the custom brace that was being order for her left knee. Please advise. Ph# [phone]

## 2018-01-13 NOTE — Telephone Encounter (Signed)
Latasha [name] has the order, I had to send demographics, he is working on it, I will call her

## 2018-01-14 DIAGNOSIS — J189 Pneumonia, unspecified organism: Secondary | ICD-10-CM | POA: Diagnosis not present

## 2018-01-18 ENCOUNTER — Ambulatory Visit: Payer: Medicare Other | Admitting: Physician Assistant

## 2018-01-22 ENCOUNTER — Telehealth: Payer: Self-pay | Admitting: Orthopedic Surgery

## 2018-01-22 NOTE — Telephone Encounter (Signed)
Information received that patient is scheduled to see [employer] brace representative, Samuel BoucheLucas, Monday, 01/25/18 (time determined by brace rep with patient); patient in process of confirming her transportation through [facility].

## 2018-01-26 DIAGNOSIS — K739 Chronic hepatitis, unspecified: Secondary | ICD-10-CM | POA: Diagnosis not present

## 2018-02-07 DIAGNOSIS — J449 Chronic obstructive pulmonary disease, unspecified: Secondary | ICD-10-CM | POA: Diagnosis not present

## 2018-02-11 ENCOUNTER — Other Ambulatory Visit: Payer: Self-pay

## 2018-02-11 ENCOUNTER — Encounter (HOSPITAL_COMMUNITY): Payer: Self-pay

## 2018-02-11 ENCOUNTER — Inpatient Hospital Stay (HOSPITAL_COMMUNITY)
Admission: EM | Admit: 2018-02-11 | Discharge: 2018-02-13 | DRG: 190 | Disposition: A | Discharge status: Home / Self Care | Payer: Medicare Other | Attending: Family Medicine | Admitting: Family Medicine

## 2018-02-11 ENCOUNTER — Emergency Department (HOSPITAL_COMMUNITY): Payer: Medicare Other

## 2018-02-11 DIAGNOSIS — Z789 Other specified health status: Secondary | ICD-10-CM

## 2018-02-11 DIAGNOSIS — F909 Attention-deficit hyperactivity disorder, unspecified type: Secondary | ICD-10-CM | POA: Diagnosis present

## 2018-02-11 DIAGNOSIS — Z9989 Dependence on other enabling machines and devices: Secondary | ICD-10-CM

## 2018-02-11 DIAGNOSIS — Z881 Allergy status to other antibiotic agents status: Secondary | ICD-10-CM

## 2018-02-11 DIAGNOSIS — Z888 Allergy status to other drugs, medicaments and biological substances status: Secondary | ICD-10-CM | POA: Diagnosis not present

## 2018-02-11 DIAGNOSIS — Z8673 Personal history of transient ischemic attack (TIA), and cerebral infarction without residual deficits: Secondary | ICD-10-CM | POA: Diagnosis not present

## 2018-02-11 DIAGNOSIS — R000 Tachycardia, unspecified: Secondary | ICD-10-CM | POA: Diagnosis not present

## 2018-02-11 DIAGNOSIS — M461 Sacroiliitis, not elsewhere classified: Secondary | ICD-10-CM | POA: Diagnosis present

## 2018-02-11 DIAGNOSIS — F1120 Opioid dependence, uncomplicated: Secondary | ICD-10-CM | POA: Diagnosis present

## 2018-02-11 DIAGNOSIS — Z716 Tobacco abuse counseling: Secondary | ICD-10-CM

## 2018-02-11 DIAGNOSIS — Z90721 Acquired absence of ovaries, unilateral: Secondary | ICD-10-CM

## 2018-02-11 DIAGNOSIS — R0902 Hypoxemia: Secondary | ICD-10-CM | POA: Diagnosis present

## 2018-02-11 DIAGNOSIS — F32A Depression, unspecified: Secondary | ICD-10-CM | POA: Diagnosis present

## 2018-02-11 DIAGNOSIS — F17290 Nicotine dependence, other tobacco product, uncomplicated: Secondary | ICD-10-CM | POA: Diagnosis present

## 2018-02-11 DIAGNOSIS — R0689 Other abnormalities of breathing: Secondary | ICD-10-CM | POA: Diagnosis not present

## 2018-02-11 DIAGNOSIS — E785 Hyperlipidemia, unspecified: Secondary | ICD-10-CM | POA: Diagnosis not present

## 2018-02-11 DIAGNOSIS — K2100 Gastro-esophageal reflux disease with esophagitis, without bleeding: Secondary | ICD-10-CM | POA: Diagnosis present

## 2018-02-11 DIAGNOSIS — Z8249 Family history of ischemic heart disease and other diseases of the circulatory system: Secondary | ICD-10-CM

## 2018-02-11 DIAGNOSIS — J40 Bronchitis, not specified as acute or chronic: Secondary | ICD-10-CM | POA: Diagnosis not present

## 2018-02-11 DIAGNOSIS — Z23 Encounter for immunization: Secondary | ICD-10-CM | POA: Diagnosis not present

## 2018-02-11 DIAGNOSIS — F419 Anxiety disorder, unspecified: Secondary | ICD-10-CM | POA: Diagnosis present

## 2018-02-11 DIAGNOSIS — R0603 Acute respiratory distress: Secondary | ICD-10-CM | POA: Diagnosis not present

## 2018-02-11 DIAGNOSIS — G894 Chronic pain syndrome: Secondary | ICD-10-CM | POA: Diagnosis present

## 2018-02-11 DIAGNOSIS — J441 Chronic obstructive pulmonary disease with (acute) exacerbation: Secondary | ICD-10-CM | POA: Diagnosis not present

## 2018-02-11 DIAGNOSIS — Z90710 Acquired absence of both cervix and uterus: Secondary | ICD-10-CM

## 2018-02-11 DIAGNOSIS — F4310 Post-traumatic stress disorder, unspecified: Secondary | ICD-10-CM | POA: Diagnosis present

## 2018-02-11 DIAGNOSIS — I10 Essential (primary) hypertension: Secondary | ICD-10-CM | POA: Diagnosis present

## 2018-02-11 DIAGNOSIS — R0602 Shortness of breath: Secondary | ICD-10-CM | POA: Diagnosis not present

## 2018-02-11 DIAGNOSIS — F1290 Cannabis use, unspecified, uncomplicated: Secondary | ICD-10-CM | POA: Diagnosis present

## 2018-02-11 DIAGNOSIS — N3281 Overactive bladder: Secondary | ICD-10-CM | POA: Diagnosis not present

## 2018-02-11 DIAGNOSIS — E1065 Type 1 diabetes mellitus with hyperglycemia: Secondary | ICD-10-CM | POA: Diagnosis not present

## 2018-02-11 DIAGNOSIS — J440 Chronic obstructive pulmonary disease with acute lower respiratory infection: Secondary | ICD-10-CM | POA: Diagnosis present

## 2018-02-11 DIAGNOSIS — Z833 Family history of diabetes mellitus: Secondary | ICD-10-CM

## 2018-02-11 DIAGNOSIS — F329 Major depressive disorder, single episode, unspecified: Secondary | ICD-10-CM | POA: Diagnosis present

## 2018-02-11 DIAGNOSIS — Z885 Allergy status to narcotic agent status: Secondary | ICD-10-CM

## 2018-02-11 DIAGNOSIS — J189 Pneumonia, unspecified organism: Secondary | ICD-10-CM | POA: Diagnosis not present

## 2018-02-11 DIAGNOSIS — M797 Fibromyalgia: Secondary | ICD-10-CM | POA: Diagnosis not present

## 2018-02-11 DIAGNOSIS — Z720 Tobacco use: Secondary | ICD-10-CM | POA: Diagnosis not present

## 2018-02-11 DIAGNOSIS — R05 Cough: Secondary | ICD-10-CM | POA: Diagnosis not present

## 2018-02-11 DIAGNOSIS — F319 Bipolar disorder, unspecified: Secondary | ICD-10-CM | POA: Diagnosis present

## 2018-02-11 DIAGNOSIS — I959 Hypotension, unspecified: Secondary | ICD-10-CM | POA: Diagnosis not present

## 2018-02-11 DIAGNOSIS — F1911 Other psychoactive substance abuse, in remission: Secondary | ICD-10-CM | POA: Diagnosis present

## 2018-02-11 DIAGNOSIS — J157 Pneumonia due to Mycoplasma pneumoniae: Secondary | ICD-10-CM | POA: Diagnosis not present

## 2018-02-11 DIAGNOSIS — Z794 Long term (current) use of insulin: Secondary | ICD-10-CM

## 2018-02-11 LAB — BASIC METABOLIC PANEL
Anion gap: 10 (ref 5–15)
BUN: 5 mg/dL — AB (ref 6–20)
CO2: 25 mmol/L (ref 22–32)
Calcium: 9 mg/dL (ref 8.9–10.3)
Chloride: 100 mmol/L (ref 98–111)
Creatinine, Ser: 0.68 mg/dL (ref 0.44–1.00)
GLUCOSE: 263 mg/dL — AB (ref 70–99)
Potassium: 4.1 mmol/L (ref 3.5–5.1)
SODIUM: 135 mmol/L (ref 135–145)

## 2018-02-11 LAB — INFLUENZA PANEL BY PCR (TYPE A & B)
Influenza A By PCR: NEGATIVE
Influenza B By PCR: NEGATIVE

## 2018-02-11 LAB — CBC WITH DIFFERENTIAL/PLATELET
Abs Immature Granulocytes: 0.05 10*3/uL (ref 0.00–0.07)
Basophils Absolute: 0 10*3/uL (ref 0.0–0.1)
Basophils Relative: 0 %
EOS ABS: 0.3 10*3/uL (ref 0.0–0.5)
EOS PCT: 2 %
HCT: 37.6 % (ref 36.0–46.0)
Hemoglobin: 12.5 g/dL (ref 12.0–15.0)
Immature Granulocytes: 0 %
Lymphocytes Relative: 17 %
Lymphs Abs: 1.9 10*3/uL (ref 0.7–4.0)
MCH: 28.9 pg (ref 26.0–34.0)
MCHC: 33.2 g/dL (ref 30.0–36.0)
MCV: 87 fL (ref 80.0–100.0)
Monocytes Absolute: 0.5 10*3/uL (ref 0.1–1.0)
Monocytes Relative: 4 %
Neutro Abs: 8.8 10*3/uL — ABNORMAL HIGH (ref 1.7–7.7)
Neutrophils Relative %: 77 %
Platelets: 208 10*3/uL (ref 150–400)
RBC: 4.32 MIL/uL (ref 3.87–5.11)
RDW: 14.3 % (ref 11.5–15.5)
WBC: 11.5 10*3/uL — AB (ref 4.0–10.5)
nRBC: 0 % (ref 0.0–0.2)

## 2018-02-11 LAB — CBG MONITORING, ED: Glucose-Capillary: 318 mg/dL — ABNORMAL HIGH (ref 70–99)

## 2018-02-11 LAB — MAGNESIUM: Magnesium: 1.7 mg/dL (ref 1.7–2.4)

## 2018-02-11 LAB — TROPONIN I: Troponin I: <0.03 ng/mL (ref <0.03)

## 2018-02-11 LAB — TSH: TSH: 0.895 u[IU]/mL (ref 0.350–4.500)

## 2018-02-11 MED ORDER — SODIUM CHLORIDE 0.9 % IV SOLN
500.0000 mg | INTRAVENOUS | Status: DC
  Administered 2018-02-12: 500 mg via INTRAVENOUS
  Filled 2018-02-11 (×5): qty 500

## 2018-02-11 MED ORDER — AMLODIPINE BESYLATE 5 MG PO TABS
10.0000 mg | ORAL_TABLET | Freq: Every day | ORAL | Status: DC
  Administered 2018-02-12 – 2018-02-13 (×2): 10 mg via ORAL
  Filled 2018-02-11 (×2): qty 2

## 2018-02-11 MED ORDER — INSULIN ASPART 100 UNIT/ML ~~LOC~~ SOLN
0.0000 [IU] | Freq: Three times a day (TID) | SUBCUTANEOUS | Status: DC
  Administered 2018-02-12: 11 [IU] via SUBCUTANEOUS
  Administered 2018-02-12: 7 [IU] via SUBCUTANEOUS
  Administered 2018-02-12: 20 [IU] via SUBCUTANEOUS
  Administered 2018-02-13: 7 [IU] via SUBCUTANEOUS
  Administered 2018-02-13: 11 [IU] via SUBCUTANEOUS

## 2018-02-11 MED ORDER — IPRATROPIUM-ALBUTEROL 0.5-2.5 (3) MG/3ML IN SOLN
3.0000 mL | Freq: Once | RESPIRATORY_TRACT | Status: AC
  Administered 2018-02-11: 3 mL via RESPIRATORY_TRACT
  Filled 2018-02-11: qty 3

## 2018-02-11 MED ORDER — DIAZEPAM 5 MG PO TABS
5.0000 mg | ORAL_TABLET | Freq: Three times a day (TID) | ORAL | Status: DC | PRN
  Administered 2018-02-12 – 2018-02-13 (×3): 5 mg via ORAL
  Filled 2018-02-11 (×3): qty 1

## 2018-02-11 MED ORDER — ZIPRASIDONE HCL 80 MG PO CAPS
80.0000 mg | ORAL_CAPSULE | Freq: Two times a day (BID) | ORAL | Status: DC
  Administered 2018-02-12 – 2018-02-13 (×3): 80 mg via ORAL
  Filled 2018-02-11 (×7): qty 1

## 2018-02-11 MED ORDER — INSULIN ASPART 100 UNIT/ML ~~LOC~~ SOLN: 10.0000 [IU] | Freq: Three times a day (TID) | SUBCUTANEOUS | Status: DC

## 2018-02-11 MED ORDER — INSULIN GLARGINE 100 UNIT/ML ~~LOC~~ SOLN
40.0000 [IU] | Freq: Every day | SUBCUTANEOUS | Status: DC
  Filled 2018-02-11: qty 0.4

## 2018-02-11 MED ORDER — PROMETHAZINE HCL 12.5 MG PO TABS
12.5000 mg | ORAL_TABLET | Freq: Four times a day (QID) | ORAL | Status: DC | PRN
  Administered 2018-02-12: 12.5 mg via ORAL
  Filled 2018-02-11: qty 1

## 2018-02-11 MED ORDER — SODIUM CHLORIDE 0.9 % IV SOLN
1.0000 g | INTRAVENOUS | Status: DC
  Administered 2018-02-11 – 2018-02-12 (×2): 1 g via INTRAVENOUS
  Filled 2018-02-11: qty 1
  Filled 2018-02-11 (×4): qty 10

## 2018-02-11 MED ORDER — LAMOTRIGINE 25 MG PO TABS
25.0000 mg | ORAL_TABLET | Freq: Two times a day (BID) | ORAL | Status: DC
  Administered 2018-02-11 – 2018-02-13 (×4): 25 mg via ORAL
  Filled 2018-02-11 (×8): qty 1

## 2018-02-11 MED ORDER — INSULIN ASPART 100 UNIT/ML ~~LOC~~ SOLN
0.0000 [IU] | Freq: Every day | SUBCUTANEOUS | Status: DC
  Administered 2018-02-11: 4 [IU] via SUBCUTANEOUS
  Administered 2018-02-12: 3 [IU] via SUBCUTANEOUS
  Filled 2018-02-11: qty 1

## 2018-02-11 MED ORDER — LEVOFLOXACIN IN D5W 750 MG/150ML IV SOLN: 750.0000 mg | Freq: Once | INTRAVENOUS | Status: DC

## 2018-02-11 MED ORDER — FLUOXETINE HCL 20 MG PO CAPS
80.0000 mg | ORAL_CAPSULE | Freq: Every day | ORAL | Status: DC
  Administered 2018-02-12 – 2018-02-13 (×2): 80 mg via ORAL
  Filled 2018-02-11 (×2): qty 4

## 2018-02-11 MED ORDER — ENOXAPARIN SODIUM 40 MG/0.4ML ~~LOC~~ SOLN
40.0000 mg | SUBCUTANEOUS | Status: DC
  Administered 2018-02-11 – 2018-02-12 (×2): 40 mg via SUBCUTANEOUS
  Filled 2018-02-11 (×2): qty 0.4

## 2018-02-11 MED ORDER — SODIUM CHLORIDE 0.9 % IV SOLN
500.0000 mg | INTRAVENOUS | Status: DC
  Administered 2018-02-11: 500 mg via INTRAVENOUS
  Filled 2018-02-11: qty 500

## 2018-02-11 MED ORDER — IPRATROPIUM-ALBUTEROL 0.5-2.5 (3) MG/3ML IN SOLN
3.0000 mL | RESPIRATORY_TRACT | Status: DC
  Administered 2018-02-11 – 2018-02-12 (×8): 3 mL via RESPIRATORY_TRACT
  Filled 2018-02-11 (×7): qty 3

## 2018-02-11 MED ORDER — METHYLPREDNISOLONE SODIUM SUCC 40 MG IJ SOLR
40.0000 mg | Freq: Four times a day (QID) | INTRAMUSCULAR | Status: DC
  Administered 2018-02-11 – 2018-02-12 (×3): 40 mg via INTRAVENOUS
  Filled 2018-02-11 (×3): qty 1

## 2018-02-11 MED ORDER — SODIUM CHLORIDE 0.9 % IV SOLN
1.0000 g | INTRAVENOUS | Status: DC
  Administered 2018-02-11: 1 g via INTRAVENOUS
  Filled 2018-02-11 (×2): qty 10

## 2018-02-11 MED ORDER — BUPRENORPHINE HCL-NALOXONE HCL 8-2 MG SL SUBL
1.0000 | SUBLINGUAL_TABLET | Freq: Three times a day (TID) | SUBLINGUAL | Status: DC
  Administered 2018-02-11 – 2018-02-13 (×5): 1 via SUBLINGUAL
  Filled 2018-02-11 (×5): qty 1

## 2018-02-11 MED ORDER — GABAPENTIN 400 MG PO CAPS
800.0000 mg | ORAL_CAPSULE | Freq: Four times a day (QID) | ORAL | Status: DC
  Administered 2018-02-11 – 2018-02-13 (×6): 800 mg via ORAL
  Filled 2018-02-11 (×6): qty 2

## 2018-02-11 MED ORDER — LISINOPRIL 5 MG PO TABS
2.5000 mg | ORAL_TABLET | Freq: Every day | ORAL | Status: DC
  Administered 2018-02-12 – 2018-02-13 (×2): 2.5 mg via ORAL
  Filled 2018-02-11 (×2): qty 1

## 2018-02-11 MED ORDER — PANTOPRAZOLE SODIUM 40 MG PO TBEC
40.0000 mg | DELAYED_RELEASE_TABLET | Freq: Every day | ORAL | Status: DC
  Administered 2018-02-12 – 2018-02-13 (×2): 40 mg via ORAL
  Filled 2018-02-11 (×2): qty 1

## 2018-02-11 MED ORDER — PANTOPRAZOLE SODIUM 40 MG PO TBEC: 40.0000 mg | DELAYED_RELEASE_TABLET | Freq: Every day | ORAL | Status: DC
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
## 2018-02-11 NOTE — ED Notes (Signed)
Respiratory notified.
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
## 2018-02-11 NOTE — ED Notes (Signed)
Attempted to notify Respiratory of breathing treatment. No answer.

## 2018-02-11 NOTE — ED Notes (Addendum)
Gave patient small sips of water per Long, MD

## 2018-02-11 NOTE — ED Notes (Signed)
Patient transported to X-ray 

## 2018-02-11 NOTE — ED Notes (Signed)
Called ICU to inquire about patient's status. ICU charge stated I would receive a call back with information.

## 2018-02-11 NOTE — Progress Notes (Signed)
  Pharmacy Antibiotic Note  Latasha [name] is a 42 y.o. female admitted on 02/11/2018 with community-acquired pneumonia.  Pharmacy was consulted for levofloxacin dosing, but  been consulted for ceftrixone/azithromycin  dosing.  Plan:  d/c levofloxacin pharmacy consult, as patient has tolerated cephalosporins in the past Start azithromycin 500mg  IV q24h Start ceftriaxone 1g IV q24h Pharmacy will sign off at this point.    Height: 5\' 7"  (170.2 cm) Weight: 240 lb (108.9 kg) IBW/kg (Calculated) : 61.6  Temp (24hrs), Avg:98.6 F (37 C), Min:98.6 F (37 C), Max:98.6 F (37 C)  Recent Labs  Lab 02/11/18 1201  WBC 11.5*    CrCl cannot be calculated (Patient's most recent lab result is older than the maximum 21 days allowed.).    Allergies  Allergen Reactions  . Aripiprazole Other (See Comments)    Jerks, Locks up jaws.   . Cephalexin Nausea And Vomiting    Pt has tolerated both Augmentin and ceftriaxone in past. N/V is a side effect.  . Haldol [Haloperidol Decanoate] Swelling and Other (See Comments)    Locks up jaws  . Tegretol [Carbamazepine] Other (See Comments)    Blisters, Sores  . Zofran Other (See Comments)    Locks up jaw, restlessness   . Ketorolac Tromethamine Rash    Antimicrobials this admission: Azithromycin 12/12 >>  Ceftriaxone 12/12>>     Microbiology results: n/a    Thank you for allowing pharmacy to be a part of this patient's care.  Latasha [name] 02/11/2018 12:56 PM

## 2018-02-11 NOTE — ED Triage Notes (Signed)
Ems reports pt c/o cough, wheezing, and sob.  EMS gave duoneb and 125mg  solumedrol PTA.

## 2018-02-11 NOTE — ED Notes (Signed)
Placed pt on 2 L O2 for comfort.

## 2018-02-12 ENCOUNTER — Inpatient Hospital Stay (HOSPITAL_COMMUNITY): Payer: Medicare Other

## 2018-02-12 ENCOUNTER — Encounter (HOSPITAL_COMMUNITY): Payer: Self-pay

## 2018-02-12 DIAGNOSIS — M461 Sacroiliitis, not elsewhere classified: Secondary | ICD-10-CM

## 2018-02-12 DIAGNOSIS — J157 Pneumonia due to Mycoplasma pneumoniae: Secondary | ICD-10-CM

## 2018-02-12 LAB — RESPIRATORY PANEL BY PCR
Adenovirus: NOT DETECTED
Bordetella pertussis: NOT DETECTED
CORONAVIRUS OC43-RVPPCR: NOT DETECTED
Chlamydophila pneumoniae: NOT DETECTED
Coronavirus 229E: NOT DETECTED
Coronavirus HKU1: NOT DETECTED
Coronavirus NL63: NOT DETECTED
Influenza A: NOT DETECTED
Influenza B: NOT DETECTED
METAPNEUMOVIRUS-RVPPCR: NOT DETECTED
Mycoplasma pneumoniae: NOT DETECTED
PARAINFLUENZA VIRUS 1-RVPPCR: NOT DETECTED
Parainfluenza Virus 2: NOT DETECTED
Parainfluenza Virus 3: NOT DETECTED
Parainfluenza Virus 4: NOT DETECTED
Respiratory Syncytial Virus: NOT DETECTED
Rhinovirus / Enterovirus: NOT DETECTED

## 2018-02-12 LAB — RAPID URINE DRUG SCREEN, HOSP PERFORMED
Amphetamines: NOT DETECTED
BARBITURATES: NOT DETECTED
Benzodiazepines: POSITIVE — AB
Cocaine: NOT DETECTED
Opiates: NOT DETECTED
Tetrahydrocannabinol: POSITIVE — AB

## 2018-02-12 LAB — CBC WITH DIFFERENTIAL/PLATELET
Abs Immature Granulocytes: 0.07 10*3/uL (ref 0.00–0.07)
Basophils Absolute: 0 10*3/uL (ref 0.0–0.1)
Basophils Relative: 0 %
Eosinophils Absolute: 0 10*3/uL (ref 0.0–0.5)
Eosinophils Relative: 0 %
HCT: 36.8 % (ref 36.0–46.0)
Hemoglobin: 12 g/dL (ref 12.0–15.0)
Immature Granulocytes: 1 %
LYMPHS ABS: 1.4 10*3/uL (ref 0.7–4.0)
Lymphocytes Relative: 14 %
MCH: 28.2 pg (ref 26.0–34.0)
MCHC: 32.6 g/dL (ref 30.0–36.0)
MCV: 86.4 fL (ref 80.0–100.0)
Monocytes Absolute: 0.2 10*3/uL (ref 0.1–1.0)
Monocytes Relative: 2 %
Neutro Abs: 8.2 10*3/uL — ABNORMAL HIGH (ref 1.7–7.7)
Neutrophils Relative %: 83 %
Platelets: 214 10*3/uL (ref 150–400)
RBC: 4.26 MIL/uL (ref 3.87–5.11)
RDW: 14.3 % (ref 11.5–15.5)
WBC: 9.9 10*3/uL (ref 4.0–10.5)
nRBC: 0 % (ref 0.0–0.2)

## 2018-02-12 LAB — GLUCOSE, CAPILLARY
Glucose-Capillary: 397 mg/dL — ABNORMAL HIGH (ref 70–99)
Glucose-Capillary: 262 mg/dL — ABNORMAL HIGH (ref 70–99)
Glucose-Capillary: 215 mg/dL — ABNORMAL HIGH (ref 70–99)
Glucose-Capillary: 271 mg/dL — ABNORMAL HIGH (ref 70–99)

## 2018-02-12 LAB — COMPREHENSIVE METABOLIC PANEL
ALT: 24 U/L (ref 0–44)
AST: 23 U/L (ref 15–41)
Albumin: 3.6 g/dL (ref 3.5–5.0)
Alkaline Phosphatase: 88 U/L (ref 38–126)
Anion gap: 10 (ref 5–15)
BILIRUBIN TOTAL: 0.8 mg/dL (ref 0.3–1.2)
BUN: 10 mg/dL (ref 6–20)
CO2: 25 mmol/L (ref 22–32)
Calcium: 9.1 mg/dL (ref 8.9–10.3)
Chloride: 99 mmol/L (ref 98–111)
Creatinine, Ser: 0.81 mg/dL (ref 0.44–1.00)
GFR calc Af Amer: >60 mL/min (ref >60)
GFR calc non Af Amer: >60 mL/min (ref >60)
Glucose, Bld: 340 mg/dL — ABNORMAL HIGH (ref 70–99)
POTASSIUM: 4.2 mmol/L (ref 3.5–5.1)
Sodium: 134 mmol/L — ABNORMAL LOW (ref 135–145)
Total Protein: 7.4 g/dL (ref 6.5–8.1)

## 2018-02-12 LAB — HEMOGLOBIN A1C
HEMOGLOBIN A1C: 7.6 % — AB (ref 4.8–5.6)
Mean Plasma Glucose: 171.42 mg/dL

## 2018-02-12 LAB — STREP PNEUMONIAE URINARY ANTIGEN: Strep Pneumo Urinary Antigen: NEGATIVE

## 2018-02-12 LAB — MAGNESIUM: Magnesium: 1.9 mg/dL (ref 1.7–2.4)

## 2018-02-12 MED ORDER — PNEUMOCOCCAL VAC POLYVALENT 25 MCG/0.5ML IJ INJ: 0.5000 mL | INJECTION | INTRAMUSCULAR | Status: DC

## 2018-02-12 MED ORDER — METHYLPREDNISOLONE SODIUM SUCC 40 MG IJ SOLR
40.0000 mg | Freq: Two times a day (BID) | INTRAMUSCULAR | Status: DC
  Administered 2018-02-12: 40 mg via INTRAVENOUS
  Filled 2018-02-12: qty 1

## 2018-02-12 MED ORDER — IPRATROPIUM-ALBUTEROL 0.5-2.5 (3) MG/3ML IN SOLN
3.0000 mL | Freq: Four times a day (QID) | RESPIRATORY_TRACT | Status: DC
  Administered 2018-02-13: 3 mL via RESPIRATORY_TRACT
  Filled 2018-02-12: qty 3

## 2018-02-12 MED ORDER — DEXTROMETHORPHAN POLISTIREX ER 30 MG/5ML PO SUER: 60.0000 mg | Freq: Two times a day (BID) | ORAL | Status: DC | PRN

## 2018-02-12 MED ORDER — INSULIN ASPART 100 UNIT/ML ~~LOC~~ SOLN
14.0000 [IU] | Freq: Three times a day (TID) | SUBCUTANEOUS | Status: DC
  Administered 2018-02-12 – 2018-02-13 (×5): 14 [IU] via SUBCUTANEOUS

## 2018-02-12 MED ORDER — INSULIN GLARGINE 100 UNIT/ML ~~LOC~~ SOLN
45.0000 [IU] | Freq: Every day | SUBCUTANEOUS | Status: DC
  Administered 2018-02-12 – 2018-02-13 (×2): 45 [IU] via SUBCUTANEOUS
  Filled 2018-02-12 (×4): qty 0.45

## 2018-02-12 MED ORDER — GUAIFENESIN ER 600 MG PO TB12
1200.0000 mg | ORAL_TABLET | Freq: Two times a day (BID) | ORAL | Status: DC
  Administered 2018-02-12 – 2018-02-13 (×3): 1200 mg via ORAL
  Filled 2018-02-12 (×3): qty 2

## 2018-02-12 MED ORDER — IPRATROPIUM-ALBUTEROL 0.5-2.5 (3) MG/3ML IN SOLN: 3.0000 mL | RESPIRATORY_TRACT | Status: DC | PRN

## 2018-02-12 NOTE — Progress Notes (Addendum)
PROGRESS NOTE  Latasha [name]  ZOX:[id number]  DOB: 06-17-1975  DOA: 02/11/2018 PCP: Remus Loffler, PA-C  Brief Admission Hx: 42 y/o female smoker with COPD presented with SOB and bilateral pneumonia.   MDM/Assessment & Plan: 1. Bilateral community acquired pneumonia - Improving.  Given the viral appearance of the chest xray, follow respiratory virus panel.  Continue the empirically started ceftriaxone and azithromycin.  Follow blood and sputum cultures.  Continue oxygen support.  CXR appears atypical pneumonia pattern.   2. Acute respiratory distress - Improving.  from COPD exacerbation and pneumonia - treating supportively.   3. COPD exacerbation - continue IV steroids but start weaning, continue antibiotics and scheduled nebs ordered.    4. Type 1 DM -  A1c pending,- pt experiencing hyperglycemia in setting of steroid use. Increased basal and prandial doses temporarily.   Monitor blood sugars closely.  Sliding scale coverage and resume home basal and prandial coverage.  Hypoglycemia precautions.   Carb modified diet.   5. GERD - protonix ordered in setting of IV steroids.   6. Chronic pain - resumed home suboxone.  7. History of drug abuse - THC picked up on UDS.   8. Bipolar - resumed home medications.  9. Tobacco abuse - Pt strongly advised to stop Vaping and using all tobacco products.   DVT Prophylaxis: lovenox  Code Status: Fulll  Family Communication: patient   Disposition Plan: Home tomorrow if better    Consultants:    Procedures:  n/a  Antimicrobials:  Ceftriaxone 12/12 >  Azithromycin 12/12 >   Subjective: Pt says she is already starting to feel better.  She has nonproductive cough.  No chills and no fever.    Objective: Vitals:   02/12/18 0326 02/12/18 0337 02/12/18 0511 02/12/18 0741  BP: 115/71  104/89   Pulse: 95  88   Resp: (!) 2  20   Temp: 97.9 F (36.6 C)  98.1 F (36.7 C)   TempSrc: Oral  Oral   SpO2: 94% 95% 94% 95%  Weight:      Height:         Intake/Output Summary (Last 24 hours) at 02/12/2018 1114 Last data filed at 02/12/2018 0900 Gross per 24 hour  Intake 696.54 ml  Output -  Net 696.54 ml   Filed Weights   02/11/18 1124  Weight: 108.9 kg   REVIEW OF SYSTEMS  As per history otherwise all reviewed and reported negative  Exam:  General exam: awake, alert, cooperative, NAD.  Respiratory system: improved BS bilateral, less wheezes and rales heard.  No increased work of breathing. Cardiovascular system: S1 & S2 heard, RRR. No JVD, murmurs, gallops, clicks or pedal edema. Gastrointestinal system: Abdomen is nondistended, soft and nontender. Normal bowel sounds heard. Central nervous system: Alert and oriented. No focal neurological deficits. Extremities: no CCE.  Data Reviewed: Basic Metabolic Panel: Recent Labs  Lab 02/11/18 1201 02/12/18 0426  NA 135 134*  K 4.1 4.2  CL 100 99  CO2 25 25  GLUCOSE 263* 340*  BUN 5* 10  CREATININE 0.68 0.81  CALCIUM 9.0 9.1  MG 1.7 1.9   Liver Function Tests: Recent Labs  Lab 02/12/18 0426  AST 23  ALT 24  ALKPHOS 88  BILITOT 0.8  PROT 7.4  ALBUMIN 3.6   No results for input(s): LIPASE, AMYLASE in the last 168 hours. No results for input(s): AMMONIA in the last 168 hours. CBC: Recent Labs  Lab 02/11/18 1201 02/12/18 0426  WBC 11.5*  9.9  NEUTROABS 8.8* 8.2*  HGB 12.5 12.0  HCT 37.6 36.8  MCV 87.0 86.4  PLT 208 214   Cardiac Enzymes: Recent Labs  Lab 02/11/18 1224  TROPONINI <0.03   CBG (last 3)  Recent Labs    02/11/18 2217 02/12/18 0824  GLUCAP 318* 397*   Recent Results (from the past 240 hour(s))  Culture, blood (routine x 2)     Status: None (Preliminary result)   Collection Time: 02/11/18  1:12 PM  Result Value Ref Range Status   Specimen Description BLOOD LEFT FOREARM  Final   Special Requests   Final    BOTTLES DRAWN AEROBIC ONLY Blood Culture results may not be optimal due to an inadequate volume of blood received in culture  bottles   Culture   Final    NO GROWTH < 24 HOURS Performed at [facility], [address], [place], [place] [address]    Report Status PENDING  Incomplete  Culture, blood (routine x 2)     Status: None (Preliminary result)   Collection Time: 02/11/18  1:19 PM  Result Value Ref Range Status   Specimen Description BLOOD RIGHT FOREARM  Final   Special Requests   Final    BOTTLES DRAWN AEROBIC AND ANAEROBIC Blood Culture results may not be optimal due to an inadequate volume of blood received in culture bottles   Culture   Final    NO GROWTH < 24 HOURS Performed at [facility], [address], [place], [place] [address]    Report Status PENDING  Incomplete     Studies: Dg Chest 2 View  Result Date: 02/11/2018 CLINICAL DATA:  Chronic bronchitis and asthma, history of vaping EXAM: CHEST - 2 VIEW COMPARISON:  09/27/2017 FINDINGS: Cardiac shadow is stable. Lungs are well aerated bilaterally. Patchy interstitial infiltrates are noted bilaterally particularly in the lung bases. No sizable effusion is seen. No pneumothorax is noted. No bony abnormality is noted. IMPRESSION: Diffuse interstitial infiltrates bilaterally primarily within the bases. Short-term follow-up is recommended following appropriate therapy. Electronically Signed   By: Alcide Clever M.D.   On: 02/11/2018 12:07   Dg Chest Port 1 View  Result Date: 02/12/2018 CLINICAL DATA:  42 y/o  F; cough, wheezing, shortness of breath. EXAM: PORTABLE CHEST 1 VIEW COMPARISON:  02/11/2018 chest radiograph FINDINGS: Stable cardiac silhouette given projection and technique. Stable reticular opacities greatest at the lung bases. No focal consolidation. No pleural effusion or pneumothorax. Bones are unremarkable. IMPRESSION: Stable reticular opacities greatest at the lung bases, possibly bronchitic changes, interstitial edema, or atypical pneumonia. No focal consolidation. Electronically Signed   By: Mitzi Hansen M.D.   On: 02/12/2018  06:07   Scheduled Meds: . amLODipine  10 mg Oral Daily  . buprenorphine-naloxone  1 tablet Sublingual TID  . enoxaparin (LOVENOX) injection  40 mg Subcutaneous Q24H  . FLUoxetine  80 mg Oral Daily  . gabapentin  800 mg Oral QID  . guaiFENesin  1,200 mg Oral BID  . insulin aspart  0-20 Units Subcutaneous TID WC  . insulin aspart  0-5 Units Subcutaneous QHS  . insulin aspart  14 Units Subcutaneous TID WC  . insulin glargine  45 Units Subcutaneous Daily  . ipratropium-albuterol  3 mL Nebulization Q4H  . lamoTRIgine  25 mg Oral BID  . lisinopril  2.5 mg Oral Daily  . methylPREDNISolone (SOLU-MEDROL) injection  40 mg Intravenous Q6H  . pantoprazole  40 mg Oral Daily  . [START ON 02/13/2018] pneumococcal 23 valent  vaccine  0.5 mL Intramuscular Tomorrow-1000  . ziprasidone  80 mg Oral BID WC   Continuous Infusions: . azithromycin    . cefTRIAXone (ROCEPHIN)  IV Stopped (02/11/18 2319)    Principal Problem:   Acute respiratory distress Active Problems:   Nicotine vapor product user   Bilateral Community Acquired PNA (pneumonia)   H/O drug abuse (HCC)   Chronic pain syndrome   Depression   COPD exacerbation (HCC)   Tobacco abuse   Sacroiliitis (HCC)   OAB (overactive bladder)   Bipolar disorder (HCC)   Hyperlipidemia   BiPAP (biphasic positive airway pressure) dependence   Type 1 diabetes mellitus with hyperglycemia (HCC)   Gastroesophageal reflux disease with esophagitis  Time spent:   Standley Dakinslanford Braycen Burandt, MD, FAAFP [facility] Pager [phone]  If 7PM-7AM, please contact night-coverage [email or web address] Password [email or web address] 02/12/2018, 11:14 AM    LOS: 1 day

## 2018-02-13 LAB — CBC WITH DIFFERENTIAL/PLATELET
Abs Immature Granulocytes: 0.21 10*3/uL — ABNORMAL HIGH (ref 0.00–0.07)
Basophils Absolute: 0 10*3/uL (ref 0.0–0.1)
Basophils Relative: 0 %
Eosinophils Absolute: 0 10*3/uL (ref 0.0–0.5)
Eosinophils Relative: 0 %
HCT: 37.2 % (ref 36.0–46.0)
Hemoglobin: 11.8 g/dL — ABNORMAL LOW (ref 12.0–15.0)
Immature Granulocytes: 2 %
Lymphocytes Relative: 15 %
Lymphs Abs: 2 10*3/uL (ref 0.7–4.0)
MCH: 28.4 pg (ref 26.0–34.0)
MCHC: 31.7 g/dL (ref 30.0–36.0)
MCV: 89.6 fL (ref 80.0–100.0)
MONOS PCT: 4 %
Monocytes Absolute: 0.5 10*3/uL (ref 0.1–1.0)
Neutro Abs: 10.6 10*3/uL — ABNORMAL HIGH (ref 1.7–7.7)
Neutrophils Relative %: 79 %
Platelets: 217 10*3/uL (ref 150–400)
RBC: 4.15 MIL/uL (ref 3.87–5.11)
RDW: 14.5 % (ref 11.5–15.5)
WBC: 13.3 10*3/uL — ABNORMAL HIGH (ref 4.0–10.5)
nRBC: 0 % (ref 0.0–0.2)

## 2018-02-13 LAB — GLUCOSE, CAPILLARY
Glucose-Capillary: 253 mg/dL — ABNORMAL HIGH (ref 70–99)
Glucose-Capillary: 217 mg/dL — ABNORMAL HIGH (ref 70–99)

## 2018-02-13 LAB — HIV ANTIBODY (ROUTINE TESTING W REFLEX): HIV Screen 4th Generation wRfx: NONREACTIVE

## 2018-02-13 LAB — COMPREHENSIVE METABOLIC PANEL
ALT: 22 U/L (ref 0–44)
AST: 16 U/L (ref 15–41)
Albumin: 3.5 g/dL (ref 3.5–5.0)
Alkaline Phosphatase: 75 U/L (ref 38–126)
Anion gap: 9 (ref 5–15)
BILIRUBIN TOTAL: 0.5 mg/dL (ref 0.3–1.2)
BUN: 18 mg/dL (ref 6–20)
CO2: 26 mmol/L (ref 22–32)
Calcium: 9.2 mg/dL (ref 8.9–10.3)
Chloride: 100 mmol/L (ref 98–111)
Creatinine, Ser: 0.81 mg/dL (ref 0.44–1.00)
GFR calc Af Amer: >60 mL/min (ref >60)
Glucose, Bld: 300 mg/dL — ABNORMAL HIGH (ref 70–99)
Potassium: 4.6 mmol/L (ref 3.5–5.1)
Sodium: 135 mmol/L (ref 135–145)
Total Protein: 7.2 g/dL (ref 6.5–8.1)

## 2018-02-13 LAB — MAGNESIUM: MAGNESIUM: 2.2 mg/dL (ref 1.7–2.4)

## 2018-02-13 MED ORDER — PREDNISONE 20 MG PO TABS: ORAL_TABLET | ORAL | 0 refills | Status: DC

## 2018-02-13 MED ORDER — GUAIFENESIN ER 600 MG PO TB12: 1200.0000 mg | ORAL_TABLET | Freq: Two times a day (BID) | ORAL | 0 refills | Status: AC

## 2018-02-13 MED ORDER — DEXTROMETHORPHAN POLISTIREX ER 30 MG/5ML PO SUER: 60.0000 mg | Freq: Two times a day (BID) | ORAL | 0 refills | Status: AC | PRN

## 2018-02-13 MED ORDER — METHYLPREDNISOLONE SODIUM SUCC 40 MG IJ SOLR
40.0000 mg | Freq: Every day | INTRAMUSCULAR | Status: DC
  Administered 2018-02-13: 40 mg via INTRAVENOUS
  Filled 2018-02-13: qty 1

## 2018-02-13 MED ORDER — ENOXAPARIN SODIUM 60 MG/0.6ML ~~LOC~~ SOLN: 50.0000 mg | SUBCUTANEOUS | Status: DC

## 2018-02-13 MED ORDER — DOXYCYCLINE HYCLATE 100 MG PO CAPS: 100.0000 mg | ORAL_CAPSULE | Freq: Two times a day (BID) | ORAL | 0 refills | Status: AC

## 2018-02-13 NOTE — Discharge Instructions (Signed)
Seek medical care or return to ER if symptoms come back, worsen or new problems develop.  Community-Acquired Pneumonia, Adult Pneumonia is an infection of the lungs. There are different types of pneumonia. One type can develop while a person is in a hospital. A different type, called community-acquired pneumonia, develops in people who are not, or have not recently been, in the hospital or other health care facility. What are the causes? Pneumonia may be caused by bacteria, viruses, or funguses. Community-acquired pneumonia is often caused by Streptococcus pneumonia bacteria. These bacteria are often passed from one person to another by breathing in droplets from the cough or sneeze of an infected person. What increases the risk? The condition is more likely to develop in:  People who havechronic diseases, such as chronic obstructive pulmonary disease (COPD), asthma, congestive heart failure, cystic fibrosis, diabetes, or kidney disease.  People who haveearly-stage or late-stage HIV.  People who havesickle cell disease.  People who havehad their spleen removed (splenectomy).  People who havepoor Administrator.  People who havemedical conditions that increase the risk of breathing in (aspirating) secretions their own mouth and nose.  People who havea weakened immune system (immunocompromised).  People who smoke.  People whotravel to areas where pneumonia-causing germs commonly exist.  People whoare around animal habitats or animals that have pneumonia-causing germs, including birds, bats, rabbits, cats, and farm animals.  What are the signs or symptoms? Symptoms of this condition include:  Adry cough.  A wet (productive) cough.  Fever.  Sweating.  Chest pain, especially when breathing deeply or coughing.  Rapid breathing or difficulty breathing.  Shortness of breath.  Shaking chills.  Fatigue.  Muscle aches.  How is this diagnosed? Your health care  provider will take a medical history and perform a physical exam. You may also have other tests, including:  Imaging studies of your chest, including X-rays.  Tests to check your blood oxygen level and other blood gases.  Other tests on blood, mucus (sputum), fluid around your lungs (pleural fluid), and urine.  If your pneumonia is severe, other tests may be done to identify the specific cause of your illness. How is this treated? The type of treatment that you receive depends on many factors, such as the cause of your pneumonia, the medicines you take, and other medical conditions that you have. For most adults, treatment and recovery from pneumonia may occur at home. In some cases, treatment must happen in a hospital. Treatment may include:  Antibiotic medicines, if the pneumonia was caused by bacteria.  Antiviral medicines, if the pneumonia was caused by a virus.  Medicines that are given by mouth or through an IV tube.  Oxygen.  Respiratory therapy.  Although rare, treating severe pneumonia may include:  Mechanical ventilation. This is done if you are not breathing well on your own and you cannot maintain a safe blood oxygen level.  Thoracentesis. This procedureremoves fluid around one lung or both lungs to help you breathe better.  Follow these instructions at home:  Take over-the-counter and prescription medicines only as told by your health care provider. ? Only takecough medicine if you are losing sleep. Understand that cough medicine can prevent your bodys natural ability to remove mucus from your lungs. ? If you were prescribed an antibiotic medicine, take it as told by your health care provider. Do not stop taking the antibiotic even if you start to feel better.  Sleep in a semi-upright position at night. Try sleeping in a reclining chair,  or place a few pillows under your head.  Do not use tobacco products, including cigarettes, chewing tobacco, and e-cigarettes. If  you need help quitting, ask your health care provider.  Drink enough water to keep your urine clear or pale yellow. This will help to thin out mucus secretions in your lungs. How is this prevented? There are ways that you can decrease your risk of developing community-acquired pneumonia. Consider getting a pneumococcal vaccine if:  You are older than 41 years of age.  You are older than 41 years of age and are undergoing cancer treatment, have chronic lung disease, or have other medical conditions that affect your immune system. Ask your health care provider if this applies to you.  There are different types and schedules of pneumococcal vaccines. Ask your health care provider which vaccination option is best for you. You may also prevent community-acquired pneumonia if you take these actions:  Get an influenza vaccine every year. Ask your health care provider which type of influenza vaccine is best for you.  Go to the dentist on a regular basis.  Wash your hands often. Use hand sanitizer if soap and water are not available.  Contact a health care provider if:  You have a fever.  You are losing sleep because you cannot control your cough with cough medicine. Get help right away if:  You have worsening shortness of breath.  You have increased chest pain.  Your sickness becomes worse, especially if you are an older adult or have a weakened immune system.  You cough up blood. This information is not intended to replace advice given to you by your health care provider. Make sure you discuss any questions you have with your health care provider. Document Released: 02/17/2005 Document Revised: 06/28/2015 Document Reviewed: 06/14/2014 [employer] Interactive Patient Education  [employer]2018 [employer]

## 2018-02-13 NOTE — Discharge Summary (Signed)
Physician Discharge Summary  Latasha [name] HUD:[id number] DOB: 1976-01-11 DOA: 02/11/2018  PCP: Terald Sleeper, PA-C  Admit date: 02/11/2018 Discharge date: 02/13/2018  Admitted From: Home  Disposition: Home   Recommendations for Outpatient Follow-up:  1. Follow up with PCP in 1 weeks  Discharge Condition: STABLE   CODE STATUS: FULL    Brief Hospitalization Summary: Please see all hospital notes, images, labs for full details of the hospitalization. HPI: Latasha [name] is a 42 y.o. female with COPD, currently smoking and vaping, with history of intubation and complicated admissions requiring bipap in the past presented to ED by EMS with complaints of cough, shortness of breath, subjective fever, and wheezing. The patient was noted to be short of breath and wheezing by EMS and was given 125 mg of Solu-Medrol prior to arrival.  The patient was noted to be short of breath and with mild tachycardia, tachypnea and hypoxia.  The patient denies any recent hospitalizations.  The patient denies nausea and vomiting.  The patient reports chest tightness with coughing but no chest pain at rest.  No radiation of symptoms.  The patient says that her symptoms are worse with movement and activities.  The patient denies swelling in the legs.  The patient denies long periods of immobility.  The patient had a chest x-ray that revealed bilateral infiltrates.  In addition, the patient was given additional nebulizer treatments with no significant improvement.  The patient was afebrile.  The patient had a leukocytosis with a white blood cell count of 11.5.  The patient was started on IV antibiotics and admission was requested for further treatment.  Brief Admission Hx: 42 y/o female smoker with COPD presented with SOB and bilateral pneumonia.   MDM/Assessment & Plan: 1. Bilateral community acquired pneumonia - Improving.  Given the viral appearance of the chest xray, respiratory virus panel was negative.  Empirically started on ceftriaxone and azithromycin. Follow blood and sputum cultures. Continue oxygen support. CXR appears atypical pneumonia pattern.  Discharge home on 5 days of oral doxycycline.  2. Acute respiratory distress - Resolved.  from COPD exacerbation and pneumonia - treated supportively.   3. COPD exacerbation - treated with IV steroids, antibiotics and scheduled nebs ordered. She is much improved now and will discharge home.  4. Type 1 DM -  A1c 7.9%,- pt experiencing hyperglycemia in setting of steroid use. Increased basal and prandial doses temporarily. Monitor blood sugars closely. Sliding scale coverage and resume home basal and prandial coverage. Hypoglycemia precautions. Carb modified diet. Follow up with PCP for recheck.  5. GERD - protonix ordered in setting of IV steroids.  6. Chronic pain - resumed home suboxone.  7. History of drug abuse - THC picked up on UDS.  8. Bipolar - resumed home medications. 9. Tobacco abuse - Pt strongly advised to stop Vaping and using all tobacco products.   DVT Prophylaxis:lovenox Code Status:Fulll Family Communication:patient Disposition Plan:Home / outpatient follow up with PCP in 1 week   Consultants:    Procedures:  n/a  Antimicrobials:  Ceftriaxone 12/12 >  Azithromycin 12/12 >    Discharge Diagnoses:  Principal Problem:   Acute respiratory distress Active Problems:   Nicotine vapor product user   Bilateral Community Acquired PNA (pneumonia)   H/O drug abuse (HCC)   Chronic pain syndrome   Depression   COPD exacerbation (HCC)   Tobacco abuse   Sacroiliitis (HCC)   OAB (overactive bladder)   Bipolar disorder (HCC)   Hyperlipidemia   BiPAP (  biphasic positive airway pressure) dependence   Type 1 diabetes mellitus with hyperglycemia (HCC)   Gastroesophageal reflux disease with esophagitis    Discharge Instructions: Discharge Instructions    Call MD for:  difficulty breathing,  headache or visual disturbances   Complete by:  As directed    Call MD for:  extreme fatigue   Complete by:  As directed    Call MD for:  persistant dizziness or light-headedness   Complete by:  As directed    Call MD for:  persistant nausea and vomiting   Complete by:  As directed    Call MD for:  severe uncontrolled pain   Complete by:  As directed    Increase activity slowly   Complete by:  As directed      Allergies as of 02/13/2018      Reactions   Aripiprazole Other (See Comments)   Jerks, Locks up jaws.    Cephalexin Nausea And Vomiting   Pt has tolerated both Augmentin and ceftriaxone in past. N/V is a side effect.   Haldol [haloperidol Decanoate] Swelling, Other (See Comments)   Locks up jaws   Tegretol [carbamazepine] Other (See Comments)   Blisters, Sores   Zofran Other (See Comments)   Locks up jaw, restlessness    Ketorolac Tromethamine Rash      Medication List    TAKE these medications   albuterol (2.5 MG/3ML) 0.083% nebulizer solution Commonly known as:  PROVENTIL Take 3 mLs (2.5 mg total) by nebulization every 6 (six) hours as needed for wheezing or shortness of breath.   albuterol 108 (90 Base) MCG/ACT inhaler Commonly known as:  PROVENTIL HFA;VENTOLIN HFA INHALE 2 PUFFS INTO THE LUNGS EVERY 6 HOURS AS NEEDED FOR WHEEZING ORSHORTNESS OF BREATH.   amLODipine 10 MG tablet Commonly known as:  NORVASC Take 1 tablet (10 mg total) by mouth daily.   Biotin 10 MG Caps Take 1 tablet by mouth daily.   dextromethorphan 30 MG/5ML liquid Commonly known as:  DELSYM Take 10 mLs (60 mg total) by mouth 2 (two) times daily as needed for cough.   diazepam 10 MG tablet Commonly known as:  VALIUM Take 0.5 tablets (5 mg total) by mouth every 8 (eight) hours as needed for anxiety.   doxycycline 100 MG capsule Commonly known as:  VIBRAMYCIN Take 1 capsule (100 mg total) by mouth 2 (two) times daily for 4 days.   FLUoxetine 40 MG capsule Commonly known as:   PROZAC Take 2 capsules (80 mg total) by mouth daily.   gabapentin 800 MG tablet Commonly known as:  NEURONTIN Take 1 tablet (800 mg total) by mouth 4 (four) times daily.   guaiFENesin 600 MG 12 hr tablet Commonly known as:  MUCINEX Take 2 tablets (1,200 mg total) by mouth 2 (two) times daily for 5 days.   insulin aspart 100 UNIT/ML FlexPen Commonly known as:  NOVOLOG Inject 10 Units into the skin 3 (three) times daily with meals.   Insulin Glargine 100 UNIT/ML Solostar Pen Commonly known as:  LANTUS Inject 40 Units into the skin daily.   Insulin Pen Needle 31G X 5 MM Misc Use TID with Insulin   lamoTRIgine 25 MG tablet Commonly known as:  LAMICTAL Take 1 tablet (25 mg total) by mouth 2 (two) times daily.   lisinopril 2.5 MG tablet Commonly known as:  PRINIVIL,ZESTRIL Take 1 tablet (2.5 mg total) by mouth daily.   ONETOUCH VERIO w/Device Kit 1 kit by Does not apply route 2 (  two) times daily.   pantoprazole 40 MG tablet Commonly known as:  PROTONIX Take 1 tablet (40 mg total) by mouth daily.   predniSONE 20 MG tablet Commonly known as:  DELTASONE Take 2 PO QAM x 5 days Start taking on:  February 14, 2018   promethazine 25 MG tablet Commonly known as:  PHENERGAN Take 25 mg by mouth every 8 (eight) hours as needed for nausea or vomiting.   pyridOXINE 50 MG tablet Commonly known as:  VITAMIN B-6 Take 50 mg by mouth daily.   SUBOXONE 8-2 MG Film Generic drug:  Buprenorphine HCl-Naloxone HCl Take 1 Film by mouth 3 (three) times daily.   ziprasidone 80 MG capsule Commonly known as:  GEODON Take 1 capsule (80 mg total) by mouth 2 (two) times daily with a meal.      Follow-up Information    Terald Sleeper, PA-C. Schedule an appointment as soon as possible for a visit in 1 week(s).   Specialties:  Physician Assistant, Family Medicine Why:  Hospital Follow Up  Contact information: [place] [address] [phone]          Allergies   Allergen Reactions  . Aripiprazole Other (See Comments)    Jerks, Locks up jaws.   . Cephalexin Nausea And Vomiting    Pt has tolerated both Augmentin and ceftriaxone in past. N/V is a side effect.  . Haldol [Haloperidol Decanoate] Swelling and Other (See Comments)    Locks up jaws  . Tegretol [Carbamazepine] Other (See Comments)    Blisters, Sores  . Zofran Other (See Comments)    Locks up jaw, restlessness   . Ketorolac Tromethamine Rash   Allergies as of 02/13/2018      Reactions   Aripiprazole Other (See Comments)   Jerks, Locks up jaws.    Cephalexin Nausea And Vomiting   Pt has tolerated both Augmentin and ceftriaxone in past. N/V is a side effect.   Haldol [haloperidol Decanoate] Swelling, Other (See Comments)   Locks up jaws   Tegretol [carbamazepine] Other (See Comments)   Blisters, Sores   Zofran Other (See Comments)   Locks up jaw, restlessness    Ketorolac Tromethamine Rash      Medication List    TAKE these medications   albuterol (2.5 MG/3ML) 0.083% nebulizer solution Commonly known as:  PROVENTIL Take 3 mLs (2.5 mg total) by nebulization every 6 (six) hours as needed for wheezing or shortness of breath.   albuterol 108 (90 Base) MCG/ACT inhaler Commonly known as:  PROVENTIL HFA;VENTOLIN HFA INHALE 2 PUFFS INTO THE LUNGS EVERY 6 HOURS AS NEEDED FOR WHEEZING ORSHORTNESS OF BREATH.   amLODipine 10 MG tablet Commonly known as:  NORVASC Take 1 tablet (10 mg total) by mouth daily.   Biotin 10 MG Caps Take 1 tablet by mouth daily.   dextromethorphan 30 MG/5ML liquid Commonly known as:  DELSYM Take 10 mLs (60 mg total) by mouth 2 (two) times daily as needed for cough.   diazepam 10 MG tablet Commonly known as:  VALIUM Take 0.5 tablets (5 mg total) by mouth every 8 (eight) hours as needed for anxiety.   doxycycline 100 MG capsule Commonly known as:  VIBRAMYCIN Take 1 capsule (100 mg total) by mouth 2 (two) times daily for 4 days.   FLUoxetine 40 MG  capsule Commonly known as:  PROZAC Take 2 capsules (80 mg total) by mouth daily.   gabapentin 800 MG tablet Commonly known as:  NEURONTIN Take  1 tablet (800 mg total) by mouth 4 (four) times daily.   guaiFENesin 600 MG 12 hr tablet Commonly known as:  MUCINEX Take 2 tablets (1,200 mg total) by mouth 2 (two) times daily for 5 days.   insulin aspart 100 UNIT/ML FlexPen Commonly known as:  NOVOLOG Inject 10 Units into the skin 3 (three) times daily with meals.   Insulin Glargine 100 UNIT/ML Solostar Pen Commonly known as:  LANTUS Inject 40 Units into the skin daily.   Insulin Pen Needle 31G X 5 MM Misc Use TID with Insulin   lamoTRIgine 25 MG tablet Commonly known as:  LAMICTAL Take 1 tablet (25 mg total) by mouth 2 (two) times daily.   lisinopril 2.5 MG tablet Commonly known as:  PRINIVIL,ZESTRIL Take 1 tablet (2.5 mg total) by mouth daily.   ONETOUCH VERIO w/Device Kit 1 kit by Does not apply route 2 (two) times daily.   pantoprazole 40 MG tablet Commonly known as:  PROTONIX Take 1 tablet (40 mg total) by mouth daily.   predniSONE 20 MG tablet Commonly known as:  DELTASONE Take 2 PO QAM x 5 days Start taking on:  February 14, 2018   promethazine 25 MG tablet Commonly known as:  PHENERGAN Take 25 mg by mouth every 8 (eight) hours as needed for nausea or vomiting.   pyridOXINE 50 MG tablet Commonly known as:  VITAMIN B-6 Take 50 mg by mouth daily.   SUBOXONE 8-2 MG Film Generic drug:  Buprenorphine HCl-Naloxone HCl Take 1 Film by mouth 3 (three) times daily.   ziprasidone 80 MG capsule Commonly known as:  GEODON Take 1 capsule (80 mg total) by mouth 2 (two) times daily with a meal.       Procedures/Studies: Dg Chest 2 View  Result Date: 02/11/2018 CLINICAL DATA:  Chronic bronchitis and asthma, history of vaping EXAM: CHEST - 2 VIEW COMPARISON:  09/27/2017 FINDINGS: Cardiac shadow is stable. Lungs are well aerated bilaterally. Patchy interstitial  infiltrates are noted bilaterally particularly in the lung bases. No sizable effusion is seen. No pneumothorax is noted. No bony abnormality is noted. IMPRESSION: Diffuse interstitial infiltrates bilaterally primarily within the bases. Short-term follow-up is recommended following appropriate therapy. Electronically Signed   By: Inez Catalina M.D.   On: 02/11/2018 12:07   Dg Chest Port 1 View  Result Date: 02/12/2018 CLINICAL DATA:  42 y/o  F; cough, wheezing, shortness of breath. EXAM: PORTABLE CHEST 1 VIEW COMPARISON:  02/11/2018 chest radiograph FINDINGS: Stable cardiac silhouette given projection and technique. Stable reticular opacities greatest at the lung bases. No focal consolidation. No pleural effusion or pneumothorax. Bones are unremarkable. IMPRESSION: Stable reticular opacities greatest at the lung bases, possibly bronchitic changes, interstitial edema, or atypical pneumonia. No focal consolidation. Electronically Signed   By: Kristine Garbe M.D.   On: 02/12/2018 06:07      Subjective: Patient says she feels well enough to go home.  She is not having any chest pain or shortness of breath.  The breathing treatments have helped her wheezing and shortness of breath.  She has been ambulating in the room without difficulty.  She says that she has home oxygen already at home, she has a nebulizer machine and medications at home and she feels she can manage at home today.  Discharge Exam: Vitals:   02/13/18 0849 02/13/18 0900  BP:  (!) 114/51  Pulse:  100  Resp:    Temp:  (!) 97.5 F (36.4 C)  SpO2: 92% 92%  Vitals:   02/12/18 2330 02/13/18 0520 02/13/18 0849 02/13/18 0900  BP:  114/62  (!) 114/51  Pulse:  78  100  Resp:  16    Temp:  97.9 F (36.6 C)  (!) 97.5 F (36.4 C)  TempSrc:  Oral  Oral  SpO2: 92% (!) 88% 92% 92%  Weight:      Height:       General exam: awake, alert, cooperative, NAD.  Respiratory system: improved BS bilateral, less wheezes, no rales.  No  increased work of breathing. Cardiovascular system: S1 & S2 heard, RRR. No JVD, murmurs, gallops, clicks or pedal edema. Gastrointestinal system: Abdomen is nondistended, soft and nontender. Normal bowel sounds heard. Central nervous system: Alert and oriented. No focal neurological deficits. Extremities: no CCE.   The results of significant diagnostics from this hospitalization (including imaging, microbiology, ancillary and laboratory) are listed below for reference.     Microbiology: Recent Results (from the past 240 hour(s))  Culture, blood (routine x 2)     Status: None (Preliminary result)   Collection Time: 02/11/18  1:12 PM  Result Value Ref Range Status   Specimen Description BLOOD LEFT FOREARM  Final   Special Requests   Final    BOTTLES DRAWN AEROBIC ONLY Blood Culture results may not be optimal due to an inadequate volume of blood received in culture bottles   Culture   Final    NO GROWTH 2 DAYS Performed at [facility], [address] [place], [place] [address]    Report Status PENDING  Incomplete  Culture, blood (routine x 2)     Status: None (Preliminary result)   Collection Time: 02/11/18  1:19 PM  Result Value Ref Range Status   Specimen Description BLOOD RIGHT FOREARM  Final   Special Requests   Final    BOTTLES DRAWN AEROBIC AND ANAEROBIC Blood Culture results may not be optimal due to an inadequate volume of blood received in culture bottles   Culture   Final    NO GROWTH 2 DAYS Performed at [facility], [address] [place], [place] [address]    Report Status PENDING  Incomplete  Respiratory Panel by PCR     Status: None   Collection Time: 02/11/18  9:29 PM  Result Value Ref Range Status   Adenovirus NOT DETECTED NOT DETECTED Final   Coronavirus 229E NOT DETECTED NOT DETECTED Final   Coronavirus HKU1 NOT DETECTED NOT DETECTED Final   Coronavirus NL63 NOT DETECTED NOT DETECTED Final   Coronavirus OC43 NOT DETECTED NOT DETECTED Final   Metapneumovirus  NOT DETECTED NOT DETECTED Final   Rhinovirus / Enterovirus NOT DETECTED NOT DETECTED Final   Influenza A NOT DETECTED NOT DETECTED Final   Influenza B NOT DETECTED NOT DETECTED Final   Parainfluenza Virus 1 NOT DETECTED NOT DETECTED Final   Parainfluenza Virus 2 NOT DETECTED NOT DETECTED Final   Parainfluenza Virus 3 NOT DETECTED NOT DETECTED Final   Parainfluenza Virus 4 NOT DETECTED NOT DETECTED Final   Respiratory Syncytial Virus NOT DETECTED NOT DETECTED Final   Bordetella pertussis NOT DETECTED NOT DETECTED Final   Chlamydophila pneumoniae NOT DETECTED NOT DETECTED Final   Mycoplasma pneumoniae NOT DETECTED NOT DETECTED Final    Comment: Performed at [facility], [place] [address], [place], [place] [address]     Labs: BNP (last 3 results) Recent Labs    06/01/17 1305  BNP 6.0   Basic Metabolic Panel: Recent Labs  Lab 02/11/18 1201 02/12/18  0426 02/13/18 0653  NA 135 134* 135  K 4.1 4.2 4.6  CL 100 99 100  CO2 '25 25 26  '$ GLUCOSE 263* 340* 300*  BUN 5* 10 18  CREATININE 0.68 0.81 0.81  CALCIUM 9.0 9.1 9.2  MG 1.7 1.9 2.2   Liver Function Tests: Recent Labs  Lab 02/12/18 0426 02/13/18 0653  AST 23 16  ALT 24 22  ALKPHOS 88 75  BILITOT 0.8 0.5  PROT 7.4 7.2  ALBUMIN 3.6 3.5   No results for input(s): LIPASE, AMYLASE in the last 168 hours. No results for input(s): AMMONIA in the last 168 hours. CBC: Recent Labs  Lab 02/11/18 1201 02/12/18 0426 02/13/18 0653  WBC 11.5* 9.9 13.3*  NEUTROABS 8.8* 8.2* 10.6*  HGB 12.5 12.0 11.8*  HCT 37.6 36.8 37.2  MCV 87.0 86.4 89.6  PLT 208 214 217   Cardiac Enzymes: Recent Labs  Lab 02/11/18 1224  TROPONINI <0.03   BNP: Invalid input(s): POCBNP CBG: Recent Labs  Lab 02/12/18 0824 02/12/18 1119 02/12/18 1633 02/12/18 2129 02/13/18 0727  GLUCAP 397* 262* 215* 271* 253*   D-Dimer No results for input(s): DDIMER in the last 72 hours. Hgb A1c Recent Labs    02/11/18 1319  HGBA1C 7.6*   Lipid  Profile No results for input(s): CHOL, HDL, LDLCALC, TRIG, CHOLHDL, LDLDIRECT in the last 72 hours. Thyroid function studies Recent Labs    02/11/18 1319  TSH 0.895   Anemia work up No results for input(s): VITAMINB12, FOLATE, FERRITIN, TIBC, IRON, RETICCTPCT in the last 72 hours. Urinalysis    Component Value Date/Time   COLORURINE YELLOW 10/31/2016 1357   APPEARANCEUR CLEAR 10/31/2016 1357   LABSPEC 1.014 10/31/2016 1357   PHURINE 5.0 10/31/2016 1357   GLUCOSEU NEGATIVE 10/31/2016 1357   HGBUR NEGATIVE 10/31/2016 1357   BILIRUBINUR NEGATIVE 10/31/2016 1357   KETONESUR 5 (A) 10/31/2016 1357   PROTEINUR 30 (A) 10/31/2016 1357   UROBILINOGEN 0.2 10/22/2012 2052   NITRITE NEGATIVE 10/31/2016 1357   LEUKOCYTESUR NEGATIVE 10/31/2016 1357   Sepsis Labs Invalid input(s): PROCALCITONIN,  WBC,  LACTICIDVEN Microbiology Recent Results (from the past 240 hour(s))  Culture, blood (routine x 2)     Status: None (Preliminary result)   Collection Time: 02/11/18  1:12 PM  Result Value Ref Range Status   Specimen Description BLOOD LEFT FOREARM  Final   Special Requests   Final    BOTTLES DRAWN AEROBIC ONLY Blood Culture results may not be optimal due to an inadequate volume of blood received in culture bottles   Culture   Final    NO GROWTH 2 DAYS Performed at [facility], [address] [place], [place] [address]    Report Status PENDING  Incomplete  Culture, blood (routine x 2)     Status: None (Preliminary result)   Collection Time: 02/11/18  1:19 PM  Result Value Ref Range Status   Specimen Description BLOOD RIGHT FOREARM  Final   Special Requests   Final    BOTTLES DRAWN AEROBIC AND ANAEROBIC Blood Culture results may not be optimal due to an inadequate volume of blood received in culture bottles   Culture   Final    NO GROWTH 2 DAYS Performed at [facility], [address], [place], [place] [address]    Report Status PENDING  Incomplete  Respiratory Panel by PCR     Status:  None   Collection Time: 02/11/18  9:29 PM  Result Value Ref Range Status   Adenovirus  NOT DETECTED NOT DETECTED Final   Coronavirus 229E NOT DETECTED NOT DETECTED Final   Coronavirus HKU1 NOT DETECTED NOT DETECTED Final   Coronavirus NL63 NOT DETECTED NOT DETECTED Final   Coronavirus OC43 NOT DETECTED NOT DETECTED Final   Metapneumovirus NOT DETECTED NOT DETECTED Final   Rhinovirus / Enterovirus NOT DETECTED NOT DETECTED Final   Influenza A NOT DETECTED NOT DETECTED Final   Influenza B NOT DETECTED NOT DETECTED Final   Parainfluenza Virus 1 NOT DETECTED NOT DETECTED Final   Parainfluenza Virus 2 NOT DETECTED NOT DETECTED Final   Parainfluenza Virus 3 NOT DETECTED NOT DETECTED Final   Parainfluenza Virus 4 NOT DETECTED NOT DETECTED Final   Respiratory Syncytial Virus NOT DETECTED NOT DETECTED Final   Bordetella pertussis NOT DETECTED NOT DETECTED Final   Chlamydophila pneumoniae NOT DETECTED NOT DETECTED Final   Mycoplasma pneumoniae NOT DETECTED NOT DETECTED Final    Comment: Performed at [facility], [place] [address] [place], [place] [address]    Time coordinating discharge: 34 mins  SIGNED:  Irwin Brakeman, MD  [facility] 02/13/2018, 10:40 AM Pager [phone]  If 7PM-7AM, please contact night-coverage [email or web address] Password [email or web address]

## 2018-02-14 LAB — LEGIONELLA PNEUMOPHILA SEROGP 1 UR AG: L. pneumophila Serogp 1 Ur Ag: NEGATIVE

## 2018-02-16 LAB — CULTURE, BLOOD (ROUTINE X 2)
Culture: NO GROWTH
Culture: NO GROWTH

## 2018-02-19 ENCOUNTER — Other Ambulatory Visit: Payer: Self-pay

## 2018-02-19 NOTE — Patient Outreach (Addendum)
[facility] Care Management  02/19/2018  Latasha [name] 04/17/1975 [id number]    EMMI-General discharge  RED ON EMMI ALERT Day # 4 Date: 02/18/18 1:35 PM  Red Alert Reason: "Lost interest in things? Yes", "Sad/hopeless/anxious/empty? Yes"   Outreach attempt # 1 successful. Spoke to patient, HIPAA verified. Addressed Red Alerts with patient. Patient states she is sad due to today is the 1 year anniversary that her mother passed away. She states she is coping and has the support of her family and finance. She denies feelings of self-harm. Patient uses medical transportation for MD appointments. She has all of her medications in the home and denies having financial hardship at this time. She is scheduled to f/u with her PCP/PA in January. Patient states if her symptoms worsen or if she feels she is unable to cope, she will call her PA Latasha [name] who knows her complete medical history. Otherwise she denies having any questions or concerns at this time. RN CM provided patient with the [facility] 24/7 nurse advice line phone number. She is very appreciative of my call today.    Plan: RN CM will close case due to patient is stable and no CM needs are identified at this time.      Delsa SaleAngel Daley Mooradian, RN,CCM [facility] Care Management Care Management Coordinator Direct Phone: [phone] Toll Free: [phone] Fax: [phone]

## 2018-02-19 NOTE — Patient Outreach (Signed)
Patient triggered Red on EMMI General Discharge Dashboard, Notification sent to:  Delsa SaleAngel Little, RN.

## 2018-02-23 DIAGNOSIS — K739 Chronic hepatitis, unspecified: Secondary | ICD-10-CM | POA: Diagnosis not present

## 2018-02-25 ENCOUNTER — Ambulatory Visit: Payer: Self-pay

## 2018-03-01 DIAGNOSIS — M1712 Unilateral primary osteoarthritis, left knee: Secondary | ICD-10-CM | POA: Diagnosis not present

## 2018-03-09 ENCOUNTER — Telehealth: Payer: Self-pay | Admitting: Physician Assistant

## 2018-03-09 DIAGNOSIS — M1712 Unilateral primary osteoarthritis, left knee: Secondary | ICD-10-CM

## 2018-03-09 DIAGNOSIS — M5432 Sciatica, left side: Secondary | ICD-10-CM

## 2018-03-09 DIAGNOSIS — M51369 Other intervertebral disc degeneration, lumbar region without mention of lumbar back pain or lower extremity pain: Secondary | ICD-10-CM

## 2018-03-09 DIAGNOSIS — S83512D Sprain of anterior cruciate ligament of left knee, subsequent encounter: Secondary | ICD-10-CM

## 2018-03-09 DIAGNOSIS — M25562 Pain in left knee: Principal | ICD-10-CM

## 2018-03-09 DIAGNOSIS — M461 Sacroiliitis, not elsewhere classified: Secondary | ICD-10-CM

## 2018-03-09 DIAGNOSIS — G8929 Other chronic pain: Secondary | ICD-10-CM

## 2018-03-09 DIAGNOSIS — M23322 Other meniscus derangements, posterior horn of medial meniscus, left knee: Secondary | ICD-10-CM

## 2018-03-09 DIAGNOSIS — M5136 Other intervertebral disc degeneration, lumbar region: Secondary | ICD-10-CM

## 2018-03-09 NOTE — Telephone Encounter (Signed)
This is okay to order 

## 2018-03-09 NOTE — Telephone Encounter (Signed)
PT is wanting to speak to [facility] nurse about needing a wheeled walker, it has to be prescribed exactly as a wheeled walker for her to be able to get what she needs, the cane is not doing anything for her anymore.

## 2018-03-10 DIAGNOSIS — J449 Chronic obstructive pulmonary disease, unspecified: Secondary | ICD-10-CM | POA: Diagnosis not present

## 2018-03-10 NOTE — Telephone Encounter (Signed)
ACL (anterior cruciate ligament) rupture  Derangement of posterior horn of medial meniscus  DDD (degenerative disc disease), lumbar  Arthritis of left knee  Sacroiliitis (HCC)   And COPD can be used

## 2018-03-10 NOTE — Telephone Encounter (Signed)
What diagnosis would you like the wheeled walker associated with?

## 2018-03-11 DIAGNOSIS — R2681 Unsteadiness on feet: Secondary | ICD-10-CM | POA: Diagnosis not present

## 2018-03-15 DIAGNOSIS — J9620 Acute and chronic respiratory failure, unspecified whether with hypoxia or hypercapnia: Secondary | ICD-10-CM | POA: Diagnosis not present

## 2018-03-15 DIAGNOSIS — J680 Bronchitis and pneumonitis due to chemicals, gases, fumes and vapors: Secondary | ICD-10-CM | POA: Diagnosis not present

## 2018-03-15 DIAGNOSIS — J189 Pneumonia, unspecified organism: Secondary | ICD-10-CM | POA: Diagnosis not present

## 2018-03-15 DIAGNOSIS — R0902 Hypoxemia: Secondary | ICD-10-CM | POA: Diagnosis not present

## 2018-03-15 DIAGNOSIS — Z8673 Personal history of transient ischemic attack (TIA), and cerebral infarction without residual deficits: Secondary | ICD-10-CM | POA: Diagnosis not present

## 2018-03-15 DIAGNOSIS — I252 Old myocardial infarction: Secondary | ICD-10-CM | POA: Diagnosis not present

## 2018-03-15 DIAGNOSIS — J441 Chronic obstructive pulmonary disease with (acute) exacerbation: Secondary | ICD-10-CM | POA: Diagnosis not present

## 2018-03-15 DIAGNOSIS — Z9981 Dependence on supplemental oxygen: Secondary | ICD-10-CM | POA: Diagnosis not present

## 2018-03-15 DIAGNOSIS — R0603 Acute respiratory distress: Secondary | ICD-10-CM | POA: Diagnosis not present

## 2018-03-15 DIAGNOSIS — E1165 Type 2 diabetes mellitus with hyperglycemia: Secondary | ICD-10-CM | POA: Diagnosis not present

## 2018-03-15 DIAGNOSIS — R0600 Dyspnea, unspecified: Secondary | ICD-10-CM | POA: Diagnosis not present

## 2018-03-15 DIAGNOSIS — E119 Type 2 diabetes mellitus without complications: Secondary | ICD-10-CM | POA: Diagnosis not present

## 2018-03-15 DIAGNOSIS — T59891A Toxic effect of other specified gases, fumes and vapors, accidental (unintentional), initial encounter: Secondary | ICD-10-CM | POA: Diagnosis not present

## 2018-03-15 DIAGNOSIS — Z720 Tobacco use: Secondary | ICD-10-CM | POA: Diagnosis not present

## 2018-03-16 IMAGING — DX DG CHEST 2V
2 series · 2 of 2 positions shown · non-contrast
Comparison: 11/06/2016, 11/04/2016.

CLINICAL DATA: Personal history of pneumonia earlier this month,
presenting with persistent cough, chest congestion and shortness of
breath. Current history of COPD

EXAM:
CHEST  2 VIEW

[chest pa]
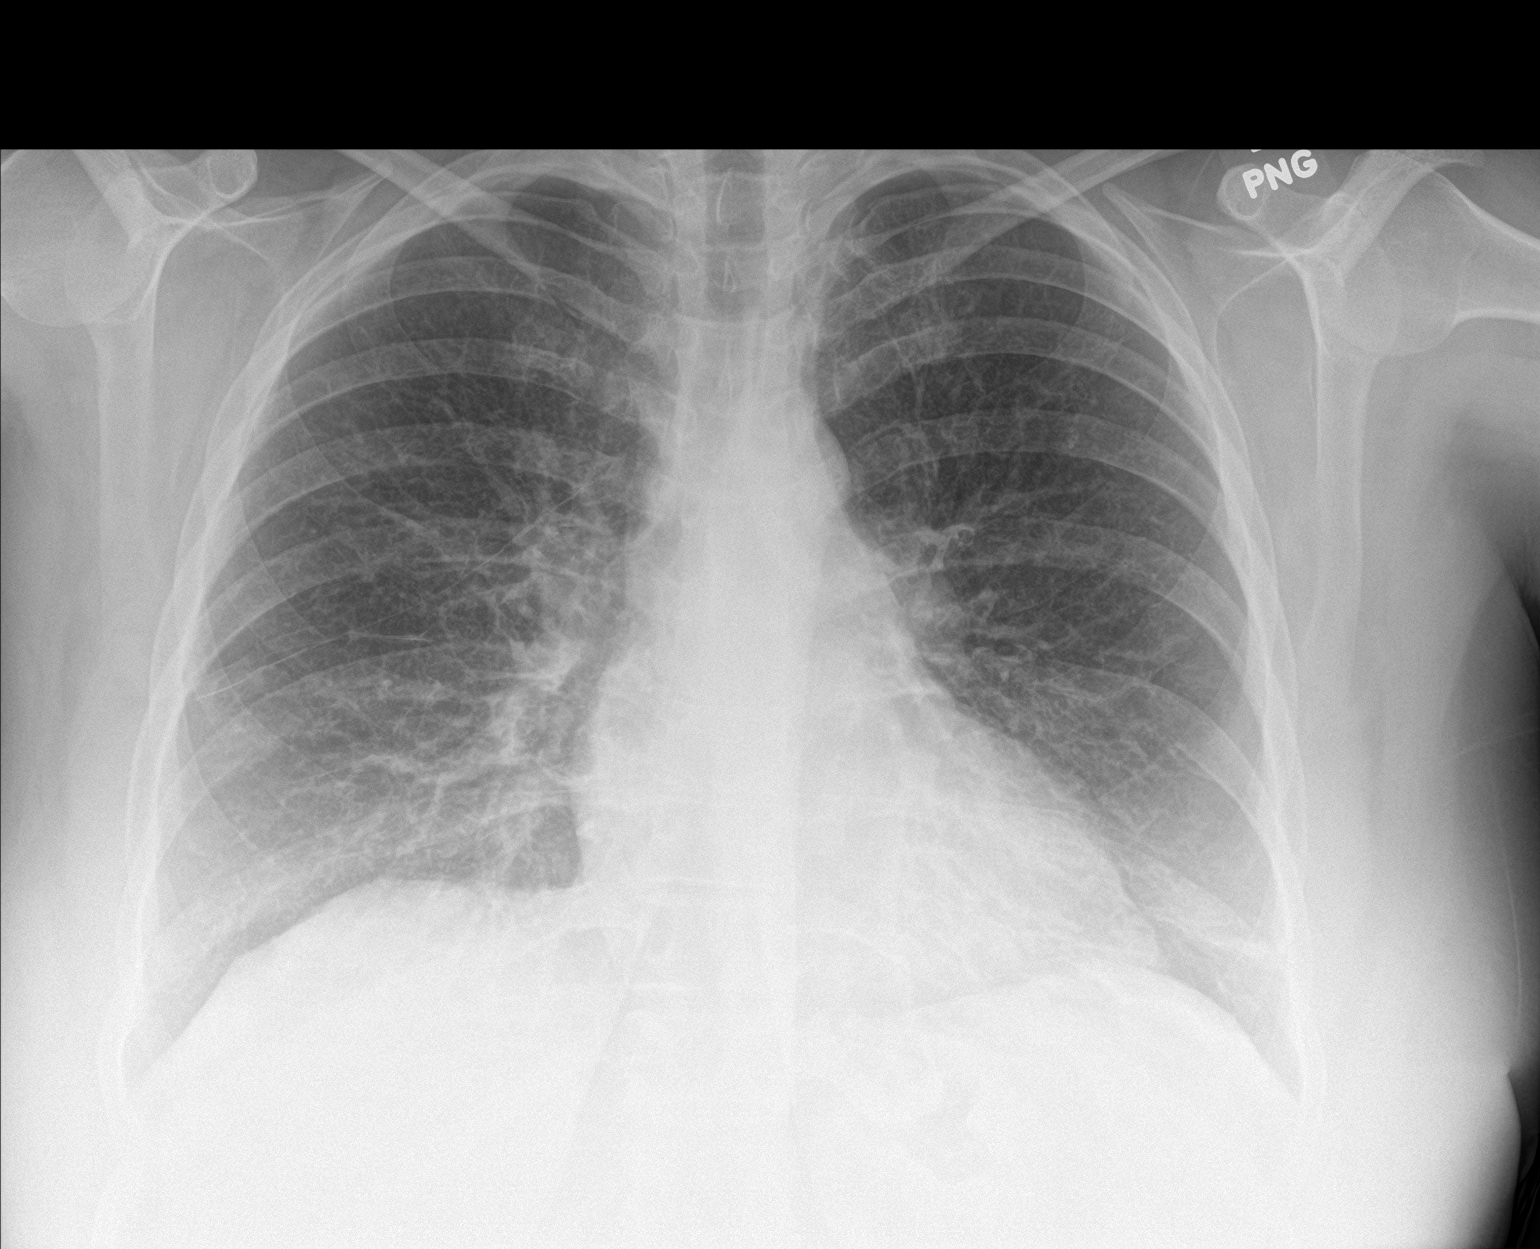

[chest lat]
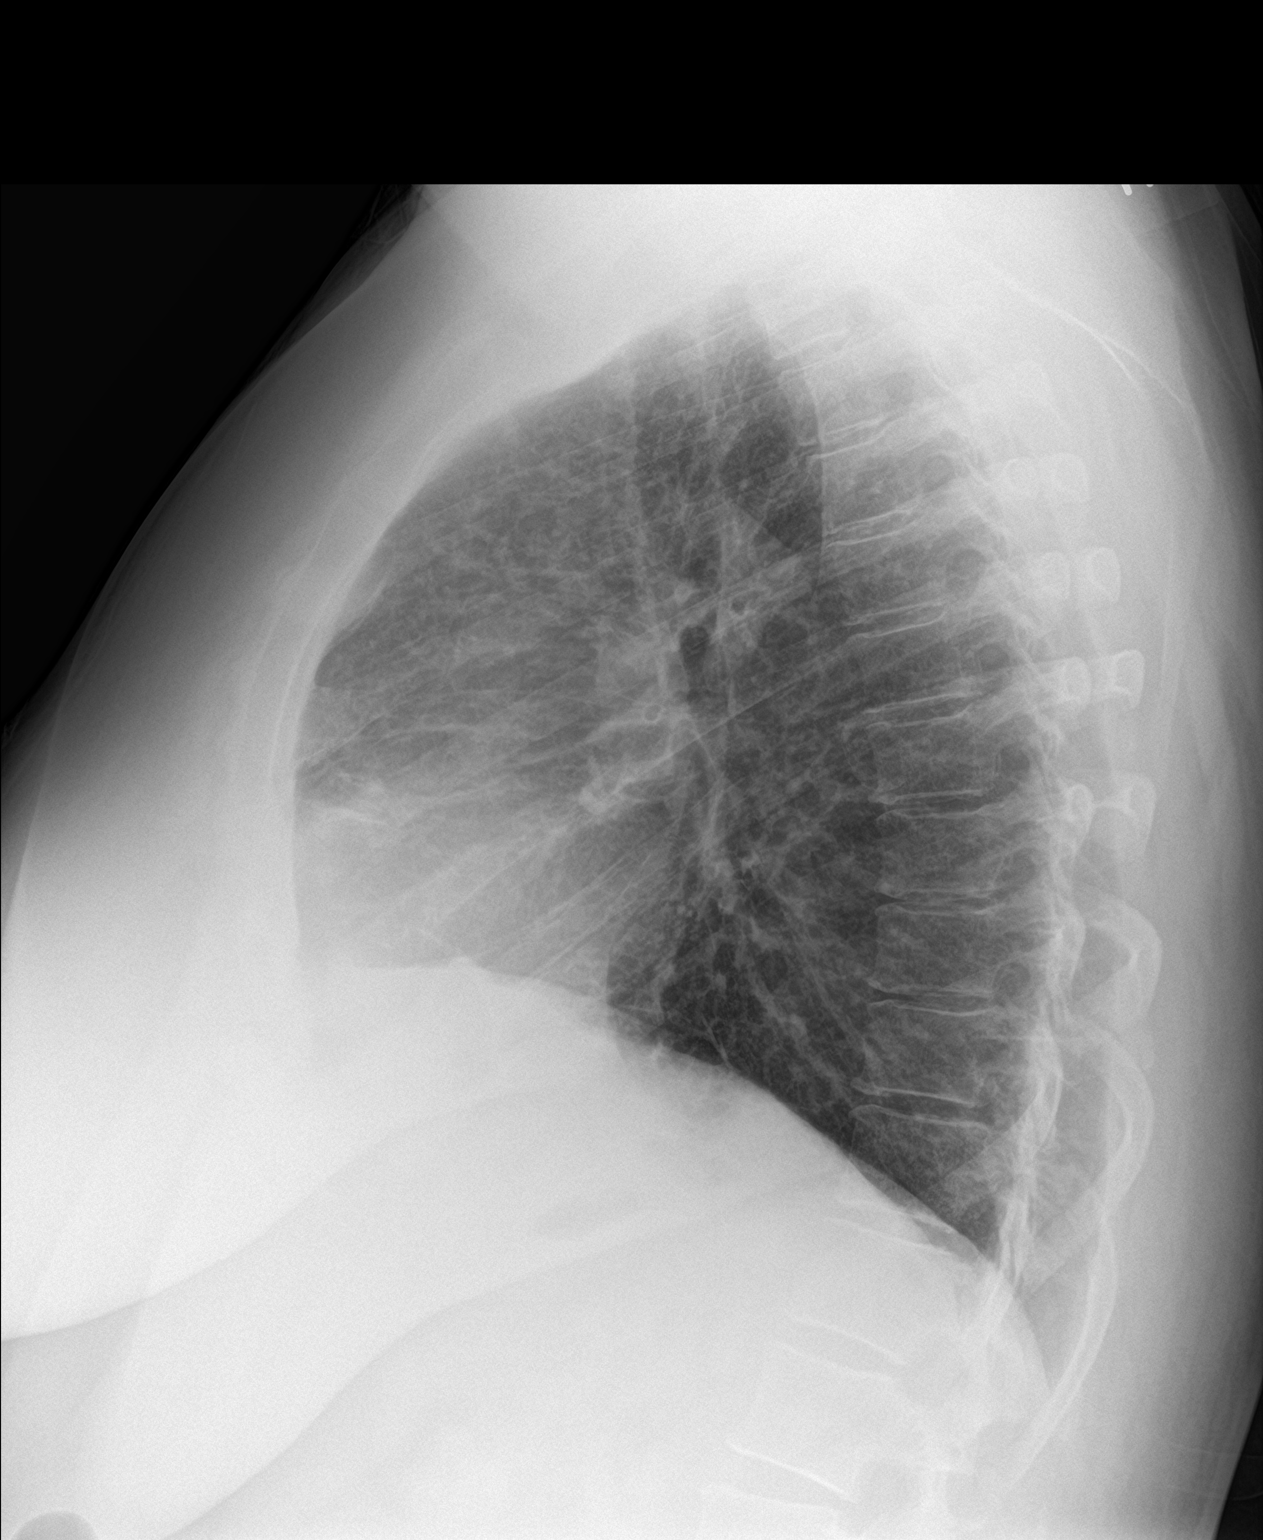

[2 of 2 positions shown; findings below may reference images not displayed]

FINDINGS: Cardiomediastinal silhouette unremarkable, unchanged. Linear
scarring in the lingula. Interval resolution of the consolidation in
the lower lobes. Prominent bronchovascular markings diffusely and
moderate central peribronchial thickening, more so than on a prior
two-view chest x-ray 09/01/2014. Lungs otherwise clear. No pleural
effusions. Mild degenerative changes involving the thoracic spine.
IMPRESSION: 1. Interval resolution of bilateral lower lobe pneumonia since 3
weeks ago. Linear scarring in the lingula.
2. Moderate changes of acute bronchitis and/or asthma superimposed
upon COPD.

## 2018-03-23 DIAGNOSIS — K739 Chronic hepatitis, unspecified: Secondary | ICD-10-CM | POA: Diagnosis not present

## 2018-04-02 ENCOUNTER — Encounter: Payer: Self-pay | Admitting: Physician Assistant

## 2018-04-02 ENCOUNTER — Ambulatory Visit (INDEPENDENT_AMBULATORY_CARE_PROVIDER_SITE_OTHER): Payer: Medicare Other | Admitting: Physician Assistant

## 2018-04-02 VITALS — BP 133/87 | HR 89 | Temp 97.0°F | Ht 67.0 in | Wt 253.6 lb

## 2018-04-02 DIAGNOSIS — K2100 Gastro-esophageal reflux disease with esophagitis, without bleeding: Secondary | ICD-10-CM

## 2018-04-02 DIAGNOSIS — E1065 Type 1 diabetes mellitus with hyperglycemia: Secondary | ICD-10-CM

## 2018-04-02 DIAGNOSIS — F319 Bipolar disorder, unspecified: Secondary | ICD-10-CM | POA: Diagnosis not present

## 2018-04-02 MED ORDER — ZIPRASIDONE HCL 80 MG PO CAPS: 80.0000 mg | ORAL_CAPSULE | Freq: Two times a day (BID) | ORAL | 5 refills | Status: AC

## 2018-04-02 MED ORDER — LISINOPRIL 2.5 MG PO TABS: 2.5000 mg | ORAL_TABLET | Freq: Every day | ORAL | 0 refills | Status: AC

## 2018-04-02 MED ORDER — DIAZEPAM 10 MG PO TABS: 5.0000 mg | ORAL_TABLET | Freq: Three times a day (TID) | ORAL | 2 refills | Status: DC | PRN

## 2018-04-04 NOTE — Progress Notes (Signed)
BP 133/87   Pulse 89   Temp (!) 97 F (36.1 C) (Oral)   Ht '5\' 7"'$  (1.702 m)   Wt 253 lb 9.6 oz (115 kg)   BMI 39.72 kg/m    Subjective:    Patient ID: Latasha [name], female    DOB: 03/25/1975, 43 y.o.   MRN: [id number]  HPI: Latasha [name] is a 43 y.o. female presenting on 04/02/2018 for Hyperlipidemia and Diabetes  This patient comes in for periodic recheck on medications and conditions including mood disorder, GERD, lung disease type 1 diabetes.  All medications are reviewed today. There are no reports of any problems with the medications. All of the medical conditions are reviewed and updated.  Lab work is reviewed and will be ordered as medically necessary. There are no new problems reported with today's visit.   Past Medical History:  Diagnosis Date  . ADHD (attention deficit hyperactivity disorder)   . Anxiety and depression   . Arthritis   . Asthma   . Bipolar disorder ([place])   . Chronic back pain   . Chronic hip pain   . Chronic neck pain   . COPD (chronic obstructive pulmonary disease) ([place])   . DDD (degenerative disc disease)   . Fibromyalgia   . Headache(784.0)   . High cholesterol   . Hypertension   . Pneumonia   . PTSD (post-traumatic stress disorder)   . Sciatica   . Seizure ([place])    scar tissue " on Brain". last seizure 11/2012  . Stroke ([place])   . Type 2 diabetes mellitus (HCC)    Relevant past medical, surgical, family and social history reviewed and updated as indicated. Interim medical history since our last visit reviewed. Allergies and medications reviewed and updated. DATA REVIEWED: CHART IN EPIC  Family History reviewed for pertinent findings.  Review of Systems  Constitutional: Negative.   HENT: Negative.   Eyes: Negative.   Respiratory: Negative.   Gastrointestinal: Negative.   Genitourinary: Negative.     Allergies as of 04/02/2018      Reactions   Aripiprazole Other (See Comments)   Jerks, Locks up jaws.    Cephalexin Nausea And  Vomiting   Pt has tolerated both Augmentin and ceftriaxone in past. N/V is a side effect.   Haldol [haloperidol Decanoate] Swelling, Other (See Comments)   Locks up jaws   Tegretol [carbamazepine] Other (See Comments)   Blisters, Sores   Zofran Other (See Comments)   Locks up jaw, restlessness    Ketorolac Tromethamine Rash      Medication List       Accurate as of April 02, 2018 11:59 PM. Always use your most recent med list.        albuterol (2.5 MG/3ML) 0.083% nebulizer solution Commonly known as:  PROVENTIL Take 3 mLs (2.5 mg total) by nebulization every 6 (six) hours as needed for wheezing or shortness of breath.   albuterol 108 (90 Base) MCG/ACT inhaler Commonly known as:  PROVENTIL HFA;VENTOLIN HFA INHALE 2 PUFFS INTO THE LUNGS EVERY 6 HOURS AS NEEDED FOR WHEEZING ORSHORTNESS OF BREATH.   amLODipine 10 MG tablet Commonly known as:  NORVASC Take 1 tablet (10 mg total) by mouth daily.   Biotin 10 MG Caps Take 1 tablet by mouth daily.   dextromethorphan 30 MG/5ML liquid Commonly known as:  DELSYM Take 10 mLs (60 mg total) by mouth 2 (two) times daily as needed for cough.   diazepam 10 MG tablet Commonly known  as:  VALIUM Take 0.5 tablets (5 mg total) by mouth every 8 (eight) hours as needed for anxiety.   FLUoxetine 40 MG capsule Commonly known as:  PROZAC Take 2 capsules (80 mg total) by mouth daily.   gabapentin 800 MG tablet Commonly known as:  NEURONTIN Take 1 tablet (800 mg total) by mouth 4 (four) times daily.   insulin aspart 100 UNIT/ML FlexPen Commonly known as:  NOVOLOG Inject 10 Units into the skin 3 (three) times daily with meals.   Insulin Glargine 100 UNIT/ML Solostar Pen Commonly known as:  LANTUS Inject 40 Units into the skin daily.   Insulin Pen Needle 31G X 5 MM Misc Use TID with Insulin   lamoTRIgine 25 MG tablet Commonly known as:  LAMICTAL Take 1 tablet (25 mg total) by mouth 2 (two) times daily.   lisinopril 2.5 MG  tablet Commonly known as:  PRINIVIL,ZESTRIL Take 1 tablet (2.5 mg total) by mouth daily.   ONETOUCH VERIO w/Device Kit 1 kit by Does not apply route 2 (two) times daily.   pantoprazole 40 MG tablet Commonly known as:  PROTONIX Take 1 tablet (40 mg total) by mouth daily.   promethazine 25 MG tablet Commonly known as:  PHENERGAN Take 25 mg by mouth every 8 (eight) hours as needed for nausea or vomiting.   pyridOXINE 50 MG tablet Commonly known as:  VITAMIN B-6 Take 50 mg by mouth daily.   SUBOXONE 8-2 MG Film Generic drug:  Buprenorphine HCl-Naloxone HCl Take 1 Film by mouth 3 (three) times daily.   ziprasidone 80 MG capsule Commonly known as:  GEODON Take 1 capsule (80 mg total) by mouth 2 (two) times daily with a meal.          Objective:    BP 133/87   Pulse 89   Temp (!) 97 F (36.1 C) (Oral)   Ht '5\' 7"'$  (1.702 m)   Wt 253 lb 9.6 oz (115 kg)   BMI 39.72 kg/m   Allergies  Allergen Reactions  . Aripiprazole Other (See Comments)    Jerks, Locks up jaws.   . Cephalexin Nausea And Vomiting    Pt has tolerated both Augmentin and ceftriaxone in past. N/V is a side effect.  . Haldol [Haloperidol Decanoate] Swelling and Other (See Comments)    Locks up jaws  . Tegretol [Carbamazepine] Other (See Comments)    Blisters, Sores  . Zofran Other (See Comments)    Locks up jaw, restlessness   . Ketorolac Tromethamine Rash    Wt Readings from Last 3 Encounters:  04/02/18 253 lb 9.6 oz (115 kg)  02/11/18 240 lb (108.9 kg)  12/30/17 239 lb 9.6 oz (108.7 kg)    Physical Exam Constitutional:      Appearance: She is well-developed.  HENT:     Head: Normocephalic and atraumatic.  Eyes:     Conjunctiva/sclera: Conjunctivae normal.     Pupils: Pupils are equal, round, and reactive to light.  Cardiovascular:     Rate and Rhythm: Normal rate and regular rhythm.     Heart sounds: Normal heart sounds.  Pulmonary:     Effort: Pulmonary effort is normal.     Breath sounds:  Normal breath sounds.  Abdominal:     General: Bowel sounds are normal.     Palpations: Abdomen is soft.  Skin:    General: Skin is warm and dry.     Findings: No rash.  Neurological:     Mental Status: She is  alert and oriented to person, place, and time.     Deep Tendon Reflexes: Reflexes are normal and symmetric.  Psychiatric:        Behavior: Behavior normal.        Thought Content: Thought content normal.        Judgment: Judgment normal.         Assessment & Plan:   1. Bipolar affective disorder, remission status unspecified (HCC) - diazepam (VALIUM) 10 MG tablet; Take 0.5 tablets (5 mg total) by mouth every 8 (eight) hours as needed for anxiety.  Dispense: 90 tablet; Refill: 2 - ziprasidone (GEODON) 80 MG capsule; Take 1 capsule (80 mg total) by mouth 2 (two) times daily with a meal.  Dispense: 60 capsule; Refill: 5  2. Type 1 diabetes mellitus with hyperglycemia (HCC) - lisinopril (PRINIVIL,ZESTRIL) 2.5 MG tablet; Take 1 tablet (2.5 mg total) by mouth daily.  Dispense: 90 tablet; Refill: 0  3. Gastroesophageal reflux disease with esophagitis continue medication   Continue all other maintenance medications as listed above.  Follow up plan: No follow-ups on file.  Educational handout given for [place] PA-C [facility] [address]  [place], [place] [address] [phone]   04/04/2018, 9:49 PM

## 2018-04-05 ENCOUNTER — Telehealth: Payer: Self-pay | Admitting: Physician Assistant

## 2018-04-05 ENCOUNTER — Other Ambulatory Visit: Payer: Self-pay | Admitting: Physician Assistant

## 2018-04-05 DIAGNOSIS — F319 Bipolar disorder, unspecified: Secondary | ICD-10-CM

## 2018-04-05 MED ORDER — DIAZEPAM 10 MG PO TABS: 10.0000 mg | ORAL_TABLET | Freq: Three times a day (TID) | ORAL | 2 refills | Status: AC | PRN

## 2018-04-05 NOTE — Telephone Encounter (Signed)
Corrected prescription has been sent to the pharmacy.  

## 2018-04-05 NOTE — Telephone Encounter (Signed)
Pt aware.

## 2018-04-10 DIAGNOSIS — J449 Chronic obstructive pulmonary disease, unspecified: Secondary | ICD-10-CM | POA: Diagnosis not present

## 2018-04-20 ENCOUNTER — Other Ambulatory Visit: Payer: Self-pay | Admitting: Physician Assistant

## 2018-04-20 DIAGNOSIS — K739 Chronic hepatitis, unspecified: Secondary | ICD-10-CM | POA: Diagnosis not present

## 2018-05-09 DIAGNOSIS — J449 Chronic obstructive pulmonary disease, unspecified: Secondary | ICD-10-CM | POA: Diagnosis not present

## 2018-05-17 ENCOUNTER — Other Ambulatory Visit: Payer: Self-pay | Admitting: Physician Assistant

## 2018-05-17 DIAGNOSIS — I10 Essential (primary) hypertension: Secondary | ICD-10-CM

## 2018-05-18 DIAGNOSIS — K739 Chronic hepatitis, unspecified: Secondary | ICD-10-CM | POA: Diagnosis not present

## 2018-05-23 DIAGNOSIS — R0689 Other abnormalities of breathing: Secondary | ICD-10-CM | POA: Diagnosis not present

## 2018-05-23 DIAGNOSIS — R0602 Shortness of breath: Secondary | ICD-10-CM | POA: Diagnosis not present

## 2018-05-23 DIAGNOSIS — I252 Old myocardial infarction: Secondary | ICD-10-CM | POA: Diagnosis not present

## 2018-05-23 DIAGNOSIS — J441 Chronic obstructive pulmonary disease with (acute) exacerbation: Secondary | ICD-10-CM | POA: Diagnosis not present

## 2018-05-23 DIAGNOSIS — J449 Chronic obstructive pulmonary disease, unspecified: Secondary | ICD-10-CM | POA: Diagnosis not present

## 2018-05-23 DIAGNOSIS — R739 Hyperglycemia, unspecified: Secondary | ICD-10-CM | POA: Diagnosis not present

## 2018-05-23 DIAGNOSIS — R0902 Hypoxemia: Secondary | ICD-10-CM | POA: Diagnosis not present

## 2018-05-23 DIAGNOSIS — I10 Essential (primary) hypertension: Secondary | ICD-10-CM | POA: Diagnosis not present

## 2018-05-23 DIAGNOSIS — Z720 Tobacco use: Secondary | ICD-10-CM | POA: Diagnosis not present

## 2018-05-23 DIAGNOSIS — J690 Pneumonitis due to inhalation of food and vomit: Secondary | ICD-10-CM | POA: Diagnosis not present

## 2018-05-23 DIAGNOSIS — Z8673 Personal history of transient ischemic attack (TIA), and cerebral infarction without residual deficits: Secondary | ICD-10-CM | POA: Diagnosis not present

## 2018-05-24 DIAGNOSIS — J189 Pneumonia, unspecified organism: Secondary | ICD-10-CM | POA: Diagnosis not present

## 2018-05-24 DIAGNOSIS — I252 Old myocardial infarction: Secondary | ICD-10-CM | POA: Diagnosis not present

## 2018-05-24 DIAGNOSIS — R0602 Shortness of breath: Secondary | ICD-10-CM | POA: Diagnosis not present

## 2018-05-24 DIAGNOSIS — Z8673 Personal history of transient ischemic attack (TIA), and cerebral infarction without residual deficits: Secondary | ICD-10-CM | POA: Diagnosis not present

## 2018-05-24 DIAGNOSIS — J690 Pneumonitis due to inhalation of food and vomit: Secondary | ICD-10-CM | POA: Diagnosis not present

## 2018-05-24 DIAGNOSIS — J449 Chronic obstructive pulmonary disease, unspecified: Secondary | ICD-10-CM | POA: Diagnosis not present

## 2018-05-24 DIAGNOSIS — J441 Chronic obstructive pulmonary disease with (acute) exacerbation: Secondary | ICD-10-CM | POA: Diagnosis not present

## 2018-05-24 DIAGNOSIS — Z720 Tobacco use: Secondary | ICD-10-CM | POA: Diagnosis not present

## 2018-05-24 DIAGNOSIS — R739 Hyperglycemia, unspecified: Secondary | ICD-10-CM | POA: Diagnosis not present

## 2018-05-24 DIAGNOSIS — R0902 Hypoxemia: Secondary | ICD-10-CM | POA: Diagnosis not present

## 2018-05-24 DIAGNOSIS — D72829 Elevated white blood cell count, unspecified: Secondary | ICD-10-CM | POA: Diagnosis not present

## 2018-06-04 ENCOUNTER — Ambulatory Visit: Payer: Medicare Other | Admitting: Physician Assistant

## 2018-06-07 ENCOUNTER — Ambulatory Visit (INDEPENDENT_AMBULATORY_CARE_PROVIDER_SITE_OTHER): Payer: Medicare Other | Admitting: Physician Assistant

## 2018-06-07 ENCOUNTER — Encounter: Payer: Self-pay | Admitting: Physician Assistant

## 2018-06-07 ENCOUNTER — Other Ambulatory Visit: Payer: Self-pay

## 2018-06-07 DIAGNOSIS — I10 Essential (primary) hypertension: Secondary | ICD-10-CM

## 2018-06-07 DIAGNOSIS — J449 Chronic obstructive pulmonary disease, unspecified: Secondary | ICD-10-CM | POA: Diagnosis not present

## 2018-06-07 DIAGNOSIS — J441 Chronic obstructive pulmonary disease with (acute) exacerbation: Secondary | ICD-10-CM | POA: Diagnosis not present

## 2018-06-07 MED ORDER — GABAPENTIN 800 MG PO TABS: 800.0000 mg | ORAL_TABLET | Freq: Four times a day (QID) | ORAL | 11 refills | Status: AC

## 2018-06-07 MED ORDER — INSULIN GLARGINE 100 UNIT/ML SOLOSTAR PEN: 40.0000 [IU] | PEN_INJECTOR | Freq: Every day | SUBCUTANEOUS | 11 refills | Status: AC

## 2018-06-07 MED ORDER — MELOXICAM 7.5 MG PO TABS: 7.5000 mg | ORAL_TABLET | Freq: Every day | ORAL | 5 refills | Status: AC

## 2018-06-07 MED ORDER — INSULIN ASPART 100 UNIT/ML FLEXPEN: 10.0000 [IU] | PEN_INJECTOR | Freq: Three times a day (TID) | SUBCUTANEOUS | 11 refills | Status: AC

## 2018-06-07 MED ORDER — ALBUTEROL SULFATE HFA 108 (90 BASE) MCG/ACT IN AERS: INHALATION_SPRAY | RESPIRATORY_TRACT | 5 refills | Status: AC

## 2018-06-07 MED ORDER — AMLODIPINE BESYLATE 10 MG PO TABS: 10.0000 mg | ORAL_TABLET | Freq: Every day | ORAL | 3 refills | Status: AC

## 2018-06-07 NOTE — Progress Notes (Signed)
History of VAPE Went into hospital 3 weeks ago, then back to hospital    Telephone visit  Subjective: CC: COPD PCP: Terald Sleeper, PA-C OEU:MPNTI MATTILYNN FORRER is a 43 y.o. female calls for telephone consult today. Patient provides verbal consent for consult held via phone.  Patient is identified with 2 separate identifiers.  At this time the entire area is on COVID-19 social distancing and stay home orders are in place.  Patient is of higher risk and therefore we are performing this by a virtual method.  Location of provider: home Location of patient: home Others present for call: no  This visit is to talk to the patient about her COPD.  Greater than 3 weeks ago she had an admission to [facility] in [place].  She was admitted for COPD exacerbation.  She has seen Dr. Luan Pulling in the past but it has been sometime.  She has had a great flareup of her breathing problems this winter.  She admits that she had been vaping.  And they feel like the vaping had created a much greater problem for her.  She admits that she has stopped that now.  She is still occasionally smoking cigarettes.  I have encouraged her to discontinue those.  We have reviewed all of her medications at this time and will send refills and that are needed.  She does have a psychiatry appointment later this month.  And that time the psychiatrist should take over the Valium prescriptions.  She reports that she is doing fairly well at home right now and is taking very good precautions that being isolated from anyone from the outside.  She does have help in getting her groceries.   ROS: Per HPI  Allergies  Allergen Reactions  . Aripiprazole Other (See Comments)    Jerks, Locks up jaws.   . Cephalexin Nausea And Vomiting    Pt has tolerated both Augmentin and ceftriaxone in past. N/V is a side effect.  . Haldol [Haloperidol Decanoate] Swelling and Other (See Comments)    Locks up jaws  . Tegretol [Carbamazepine] Other  (See Comments)    Blisters, Sores  . Zofran Other (See Comments)    Locks up jaw, restlessness   . Ketorolac Tromethamine Rash   Past Medical History:  Diagnosis Date  . ADHD (attention deficit hyperactivity disorder)   . Anxiety and depression   . Arthritis   . Asthma   . Bipolar disorder ([place])   . Chronic back pain   . Chronic hip pain   . Chronic neck pain   . COPD (chronic obstructive pulmonary disease) ([place])   . DDD (degenerative disc disease)   . Fibromyalgia   . Headache(784.0)   . High cholesterol   . Hypertension   . Pneumonia   . PTSD (post-traumatic stress disorder)   . Sciatica   . Seizure ([place])    scar tissue " on Brain". last seizure 11/2012  . Stroke ([place])   . Type 2 diabetes mellitus (HCC)     Current Outpatient Medications:  .  albuterol (PROVENTIL HFA;VENTOLIN HFA) 108 (90 Base) MCG/ACT inhaler, INHALE 2 PUFFS INTO THE LUNGS EVERY 6 HOURS AS NEEDED FOR WHEEZING ORSHORTNESS OF BREATH., Disp: 8.5 g, Rfl: 5 .  albuterol (PROVENTIL) (2.5 MG/3ML) 0.083% nebulizer solution, Take 3 mLs (2.5 mg total) by nebulization every 6 (six) hours as needed for wheezing or shortness of breath., Disp: 75 mL, Rfl: 12 .  amLODipine (NORVASC) 10 MG tablet, Take 1  tablet (10 mg total) by mouth daily., Disp: 90 tablet, Rfl: 3 .  Biotin 10 MG CAPS, Take 1 tablet by mouth daily., Disp: , Rfl:  .  Blood Glucose Monitoring Suppl (ONETOUCH VERIO) w/Device KIT, 1 kit by Does not apply route 2 (two) times daily., Disp: 1 kit, Rfl: 0 .  dextromethorphan (DELSYM) 30 MG/5ML liquid, Take 10 mLs (60 mg total) by mouth 2 (two) times daily as needed for cough., Disp: 89 mL, Rfl: 0 .  diazepam (VALIUM) 10 MG tablet, Take 1 tablet (10 mg total) by mouth every 8 (eight) hours as needed for anxiety., Disp: 90 tablet, Rfl: 2 .  FLUoxetine (PROZAC) 40 MG capsule, Take 2 capsules (80 mg total) by mouth daily., Disp: 60 capsule, Rfl: 5 .  gabapentin (NEURONTIN) 800 MG tablet, Take 1 tablet (800 mg total)  by mouth 4 (four) times daily., Disp: 120 tablet, Rfl: 11 .  insulin aspart (NOVOLOG) 100 UNIT/ML FlexPen, Inject 10 Units into the skin 3 (three) times daily with meals., Disp: 15 mL, Rfl: 11 .  Insulin Glargine (LANTUS) 100 UNIT/ML Solostar Pen, Inject 40 Units into the skin daily., Disp: 15 mL, Rfl: 11 .  Insulin Pen Needle 31G X 5 MM MISC, Use TID with Insulin, Disp: 300 each, Rfl: 3 .  lamoTRIgine (LAMICTAL) 25 MG tablet, Take 1 tablet (25 mg total) by mouth 2 (two) times daily., Disp: 60 tablet, Rfl: 5 .  lisinopril (PRINIVIL,ZESTRIL) 2.5 MG tablet, Take 1 tablet (2.5 mg total) by mouth daily., Disp: 90 tablet, Rfl: 0 .  meloxicam (MOBIC) 7.5 MG tablet, Take 1 tablet (7.5 mg total) by mouth daily., Disp: 30 tablet, Rfl: 5 .  pantoprazole (PROTONIX) 40 MG tablet, Take 1 tablet (40 mg total) by mouth daily., Disp: 30 tablet, Rfl: 11 .  promethazine (PHENERGAN) 25 MG tablet, Take 25 mg by mouth every 8 (eight) hours as needed for nausea or vomiting., Disp: , Rfl:  .  pyridOXINE (VITAMIN B-6) 50 MG tablet, Take 50 mg by mouth daily., Disp: , Rfl:  .  SUBOXONE 8-2 MG FILM, Take 1 Film by mouth 3 (three) times daily., Disp: , Rfl: 0 .  ziprasidone (GEODON) 80 MG capsule, Take 1 capsule (80 mg total) by mouth 2 (two) times daily with a meal., Disp: 60 capsule, Rfl: 5  Assessment/ Plan: 43 y.o. female   1. COPD with acute exacerbation (HCC) - albuterol (PROVENTIL HFA;VENTOLIN HFA) 108 (90 Base) MCG/ACT inhaler; INHALE 2 PUFFS INTO THE LUNGS EVERY 6 HOURS AS NEEDED FOR WHEEZING ORSHORTNESS OF BREATH.  Dispense: 8.5 g; Refill: 5 - Ambulatory referral to Pulmonology  2. Essential hypertension - amLODipine (NORVASC) 10 MG tablet; Take 1 tablet (10 mg total) by mouth daily.  Dispense: 90 tablet; Refill: 3  3. COPD (chronic obstructive pulmonary disease) with chronic bronchitis ([place]) - Ambulatory referral to Pulmonology    Start time: 11:44 AM End time: 11:59 AM  Meds ordered this encounter   Medications  . albuterol (PROVENTIL HFA;VENTOLIN HFA) 108 (90 Base) MCG/ACT inhaler    Sig: INHALE 2 PUFFS INTO THE LUNGS EVERY 6 HOURS AS NEEDED FOR WHEEZING ORSHORTNESS OF BREATH.    Dispense:  8.5 g    Refill:  5    Order Specific Question:   Supervising Provider    Answer:   Janora Norlander [[id number]]  . amLODipine (NORVASC) 10 MG tablet    Sig: Take 1 tablet (10 mg total) by mouth daily.    Dispense:  90 tablet  Refill:  3    Order Specific Question:   Supervising Provider    Answer:   Janora Norlander [[id number]]  . Insulin Glargine (LANTUS) 100 UNIT/ML Solostar Pen    Sig: Inject 40 Units into the skin daily.    Dispense:  15 mL    Refill:  11    Order Specific Question:   Supervising Provider    Answer:   Janora Norlander [[id number]]  . insulin aspart (NOVOLOG) 100 UNIT/ML FlexPen    Sig: Inject 10 Units into the skin 3 (three) times daily with meals.    Dispense:  15 mL    Refill:  11    Order Specific Question:   Supervising Provider    Answer:   Janora Norlander [[id number]]  . gabapentin (NEURONTIN) 800 MG tablet    Sig: Take 1 tablet (800 mg total) by mouth 4 (four) times daily.    Dispense:  120 tablet    Refill:  11    Order Specific Question:   Supervising Provider    Answer:   Janora Norlander [[id number]]  . meloxicam (MOBIC) 7.5 MG tablet    Sig: Take 1 tablet (7.5 mg total) by mouth daily.    Dispense:  30 tablet    Refill:  5    Order Specific Question:   Supervising Provider    Answer:   Janora Norlander [[id number]]    Particia Nearing PA-C [place] [phone]

## 2018-06-09 DIAGNOSIS — J449 Chronic obstructive pulmonary disease, unspecified: Secondary | ICD-10-CM | POA: Diagnosis not present

## 2018-06-14 DIAGNOSIS — J9611 Chronic respiratory failure with hypoxia: Secondary | ICD-10-CM | POA: Diagnosis not present

## 2018-06-14 DIAGNOSIS — E1165 Type 2 diabetes mellitus with hyperglycemia: Secondary | ICD-10-CM | POA: Diagnosis not present

## 2018-06-14 DIAGNOSIS — J441 Chronic obstructive pulmonary disease with (acute) exacerbation: Secondary | ICD-10-CM | POA: Diagnosis not present

## 2018-06-15 DIAGNOSIS — K739 Chronic hepatitis, unspecified: Secondary | ICD-10-CM | POA: Diagnosis not present

## 2018-06-17 DIAGNOSIS — I5181 Takotsubo syndrome: Secondary | ICD-10-CM | POA: Diagnosis not present

## 2018-06-17 DIAGNOSIS — J90 Pleural effusion, not elsewhere classified: Secondary | ICD-10-CM | POA: Diagnosis not present

## 2018-06-17 DIAGNOSIS — J189 Pneumonia, unspecified organism: Secondary | ICD-10-CM | POA: Diagnosis not present

## 2018-06-17 DIAGNOSIS — J9601 Acute respiratory failure with hypoxia: Secondary | ICD-10-CM | POA: Diagnosis not present

## 2018-06-17 DIAGNOSIS — I499 Cardiac arrhythmia, unspecified: Secondary | ICD-10-CM | POA: Diagnosis not present

## 2018-06-17 DIAGNOSIS — E875 Hyperkalemia: Secondary | ICD-10-CM | POA: Diagnosis not present

## 2018-06-17 DIAGNOSIS — N179 Acute kidney failure, unspecified: Secondary | ICD-10-CM | POA: Diagnosis not present

## 2018-06-17 DIAGNOSIS — I959 Hypotension, unspecified: Secondary | ICD-10-CM | POA: Diagnosis not present

## 2018-06-17 DIAGNOSIS — R092 Respiratory arrest: Secondary | ICD-10-CM | POA: Diagnosis not present

## 2018-06-17 DIAGNOSIS — J811 Chronic pulmonary edema: Secondary | ICD-10-CM | POA: Diagnosis not present

## 2018-06-17 DIAGNOSIS — J9690 Respiratory failure, unspecified, unspecified whether with hypoxia or hypercapnia: Secondary | ICD-10-CM | POA: Diagnosis not present

## 2018-06-17 DIAGNOSIS — J80 Acute respiratory distress syndrome: Secondary | ICD-10-CM | POA: Diagnosis not present

## 2018-06-17 DIAGNOSIS — I10 Essential (primary) hypertension: Secondary | ICD-10-CM | POA: Diagnosis not present

## 2018-06-17 DIAGNOSIS — R918 Other nonspecific abnormal finding of lung field: Secondary | ICD-10-CM | POA: Diagnosis not present

## 2018-06-17 DIAGNOSIS — R410 Disorientation, unspecified: Secondary | ICD-10-CM | POA: Diagnosis not present

## 2018-06-17 DIAGNOSIS — Z79899 Other long term (current) drug therapy: Secondary | ICD-10-CM | POA: Diagnosis not present

## 2018-06-17 DIAGNOSIS — U070 Vaping-related disorder: Secondary | ICD-10-CM | POA: Diagnosis not present

## 2018-06-17 DIAGNOSIS — Z720 Tobacco use: Secondary | ICD-10-CM | POA: Diagnosis not present

## 2018-06-17 DIAGNOSIS — R0603 Acute respiratory distress: Secondary | ICD-10-CM | POA: Diagnosis not present

## 2018-06-17 DIAGNOSIS — T59891A Toxic effect of other specified gases, fumes and vapors, accidental (unintentional), initial encounter: Secondary | ICD-10-CM | POA: Diagnosis not present

## 2018-06-17 DIAGNOSIS — R9431 Abnormal electrocardiogram [ECG] [EKG]: Secondary | ICD-10-CM | POA: Diagnosis not present

## 2018-06-17 DIAGNOSIS — Z4682 Encounter for fitting and adjustment of non-vascular catheter: Secondary | ICD-10-CM | POA: Diagnosis not present

## 2018-06-17 DIAGNOSIS — R6521 Severe sepsis with septic shock: Secondary | ICD-10-CM | POA: Diagnosis not present

## 2018-06-17 DIAGNOSIS — E1165 Type 2 diabetes mellitus with hyperglycemia: Secondary | ICD-10-CM | POA: Diagnosis not present

## 2018-06-17 DIAGNOSIS — I21A1 Myocardial infarction type 2: Secondary | ICD-10-CM | POA: Diagnosis not present

## 2018-06-17 DIAGNOSIS — Z9911 Dependence on respirator [ventilator] status: Secondary | ICD-10-CM | POA: Diagnosis not present

## 2018-06-17 DIAGNOSIS — R0602 Shortness of breath: Secondary | ICD-10-CM | POA: Diagnosis not present

## 2018-06-17 DIAGNOSIS — I469 Cardiac arrest, cause unspecified: Secondary | ICD-10-CM | POA: Diagnosis not present

## 2018-06-17 DIAGNOSIS — R404 Transient alteration of awareness: Secondary | ICD-10-CM | POA: Diagnosis not present

## 2018-06-17 DIAGNOSIS — R7989 Other specified abnormal findings of blood chemistry: Secondary | ICD-10-CM | POA: Diagnosis not present

## 2018-06-17 DIAGNOSIS — R0902 Hypoxemia: Secondary | ICD-10-CM | POA: Diagnosis not present

## 2018-06-17 DIAGNOSIS — G8929 Other chronic pain: Secondary | ICD-10-CM | POA: Diagnosis not present

## 2018-06-17 DIAGNOSIS — J9600 Acute respiratory failure, unspecified whether with hypoxia or hypercapnia: Secondary | ICD-10-CM | POA: Diagnosis not present

## 2018-06-17 DIAGNOSIS — A419 Sepsis, unspecified organism: Secondary | ICD-10-CM | POA: Diagnosis not present

## 2018-06-17 DIAGNOSIS — Z888 Allergy status to other drugs, medicaments and biological substances status: Secondary | ICD-10-CM | POA: Diagnosis not present

## 2018-06-17 DIAGNOSIS — R4020 Unspecified coma: Secondary | ICD-10-CM | POA: Diagnosis not present

## 2018-06-17 DIAGNOSIS — J440 Chronic obstructive pulmonary disease with acute lower respiratory infection: Secondary | ICD-10-CM | POA: Diagnosis not present

## 2018-06-17 DIAGNOSIS — R509 Fever, unspecified: Secondary | ICD-10-CM | POA: Diagnosis not present

## 2018-06-17 DIAGNOSIS — I110 Hypertensive heart disease with heart failure: Secondary | ICD-10-CM | POA: Diagnosis not present

## 2018-06-17 DIAGNOSIS — I82411 Acute embolism and thrombosis of right femoral vein: Secondary | ICD-10-CM | POA: Diagnosis not present

## 2018-06-17 DIAGNOSIS — E1369 Other specified diabetes mellitus with other specified complication: Secondary | ICD-10-CM | POA: Diagnosis not present

## 2018-06-17 DIAGNOSIS — J9602 Acute respiratory failure with hypercapnia: Secondary | ICD-10-CM | POA: Diagnosis not present

## 2018-06-17 DIAGNOSIS — Z452 Encounter for adjustment and management of vascular access device: Secondary | ICD-10-CM | POA: Diagnosis not present

## 2018-06-17 DIAGNOSIS — I5020 Unspecified systolic (congestive) heart failure: Secondary | ICD-10-CM | POA: Diagnosis not present

## 2018-06-17 DIAGNOSIS — J441 Chronic obstructive pulmonary disease with (acute) exacerbation: Secondary | ICD-10-CM | POA: Diagnosis not present

## 2018-06-17 DIAGNOSIS — I824Y1 Acute embolism and thrombosis of unspecified deep veins of right proximal lower extremity: Secondary | ICD-10-CM | POA: Diagnosis not present

## 2018-06-17 DIAGNOSIS — J449 Chronic obstructive pulmonary disease, unspecified: Secondary | ICD-10-CM | POA: Diagnosis not present

## 2018-06-17 DIAGNOSIS — I824Z1 Acute embolism and thrombosis of unspecified deep veins of right distal lower extremity: Secondary | ICD-10-CM | POA: Diagnosis not present

## 2018-06-26 DIAGNOSIS — Z79899 Other long term (current) drug therapy: Secondary | ICD-10-CM | POA: Diagnosis not present

## 2018-06-26 DIAGNOSIS — Z720 Tobacco use: Secondary | ICD-10-CM | POA: Diagnosis not present

## 2018-06-26 DIAGNOSIS — J449 Chronic obstructive pulmonary disease, unspecified: Secondary | ICD-10-CM | POA: Diagnosis not present

## 2018-06-26 DIAGNOSIS — J189 Pneumonia, unspecified organism: Secondary | ICD-10-CM | POA: Diagnosis not present

## 2018-06-26 DIAGNOSIS — R092 Respiratory arrest: Secondary | ICD-10-CM | POA: Diagnosis not present

## 2018-06-26 DIAGNOSIS — I469 Cardiac arrest, cause unspecified: Secondary | ICD-10-CM | POA: Diagnosis not present

## 2018-06-28 MED ORDER — HEART 140 MG PO TABS
0.50 | ORAL_TABLET | ORAL | Status: DC
Start: ? — End: 2018-06-28

## 2018-06-28 MED ORDER — BUPRENORPHINE HCL-NALOXONE HCL 8-2 MG SL FILM
8.00 | ORAL_FILM | SUBLINGUAL | Status: DC
Start: 2018-06-26 — End: 2018-06-28

## 2018-06-28 MED ORDER — GENERIC EXTERNAL MEDICATION
Status: DC
Start: ? — End: 2018-06-28

## 2018-06-28 MED ORDER — NICOTINE POLACRILEX 4 MG MT GUM
4.00 | CHEWING_GUM | OROMUCOSAL | Status: DC
Start: ? — End: 2018-06-28

## 2018-06-28 MED ORDER — INFLUENZA VAC SPLIT QUAD 0.5 ML IM SUSY
0.50 | PREFILLED_SYRINGE | INTRAMUSCULAR | Status: DC
Start: ? — End: 2018-06-28

## 2018-06-28 MED ORDER — FLUOXETINE HCL 20 MG PO CAPS
40.00 | ORAL_CAPSULE | ORAL | Status: DC
Start: 2018-06-26 — End: 2018-06-28

## 2018-06-28 MED ORDER — FUROSEMIDE 10 MG/ML IJ SOLN
40.00 | INTRAMUSCULAR | Status: DC
Start: 2018-06-27 — End: 2018-06-28

## 2018-06-28 MED ORDER — NICOTINE 21 MG/24HR TD PT24
1.00 | MEDICATED_PATCH | TRANSDERMAL | Status: DC
Start: 2018-06-27 — End: 2018-06-28

## 2018-06-28 MED ORDER — GABAPENTIN 400 MG PO CAPS
800.00 | ORAL_CAPSULE | ORAL | Status: DC
Start: 2018-06-26 — End: 2018-06-28

## 2018-06-28 MED ORDER — LISINOPRIL 5 MG PO TABS
5.00 | ORAL_TABLET | ORAL | Status: DC
Start: 2018-06-27 — End: 2018-06-28

## 2018-06-28 MED ORDER — DIAZEPAM 5 MG PO TABS
10.00 | ORAL_TABLET | ORAL | Status: DC
Start: 2018-06-26 — End: 2018-06-28

## 2018-06-28 MED ORDER — INSULIN GLARGINE 100 UNIT/ML ~~LOC~~ SOLN
40.00 | SUBCUTANEOUS | Status: DC
Start: 2018-06-26 — End: 2018-06-28

## 2018-06-28 MED ORDER — BUDESONIDE 0.5 MG/2ML IN SUSP
0.50 | RESPIRATORY_TRACT | Status: DC
Start: 2018-06-26 — End: 2018-06-28

## 2018-06-28 MED ORDER — ZIPRASIDONE HCL 80 MG PO CAPS
80.00 | ORAL_CAPSULE | ORAL | Status: DC
Start: 2018-06-26 — End: 2018-06-28

## 2018-06-28 MED ORDER — INSULIN LISPRO 100 UNIT/ML ~~LOC~~ SOLN
10.00 | SUBCUTANEOUS | Status: DC
Start: 2018-06-26 — End: 2018-06-28

## 2018-06-28 MED ORDER — MEDI-TUSSIN DM DOUBLE STRENGTH 30-200 MG/5ML PO LIQD
1000.00 | ORAL | Status: DC
Start: ? — End: 2018-06-28

## 2018-06-28 MED ORDER — POLYETHYLENE GLYCOL 3350 17 G PO PACK
17.00 | PACK | ORAL | Status: DC
Start: 2018-06-27 — End: 2018-06-28

## 2018-06-28 MED ORDER — INSULIN LISPRO 100 UNIT/ML ~~LOC~~ SOLN
0.00 | SUBCUTANEOUS | Status: DC
Start: 2018-06-26 — End: 2018-06-28

## 2018-06-28 MED ORDER — NICOTINE POLACRILEX 4 MG MT LOZG
4.00 | LOZENGE | OROMUCOSAL | Status: DC
Start: ? — End: 2018-06-28

## 2018-06-28 MED ORDER — GENERIC EXTERNAL MEDICATION
30.00 | Status: DC
Start: 2018-06-27 — End: 2018-06-28

## 2018-06-28 MED ORDER — LAMOTRIGINE 25 MG PO TABS
25.00 | ORAL_TABLET | ORAL | Status: DC
Start: 2018-06-26 — End: 2018-06-28

## 2018-07-02 DEATH — deceased

## 2018-07-05 ENCOUNTER — Ambulatory Visit: Payer: Medicare Other | Admitting: Physician Assistant

## 2018-07-21 ENCOUNTER — Telehealth: Payer: Self-pay | Admitting: Physician Assistant

## 2018-09-19 IMAGING — CR DG CHEST 1V PORT
1 series · 1 of 1 positions shown · non-contrast
Comparison: Yesterday

CLINICAL DATA: CHF

EXAM:
PORTABLE CHEST 1 VIEW

[portable]
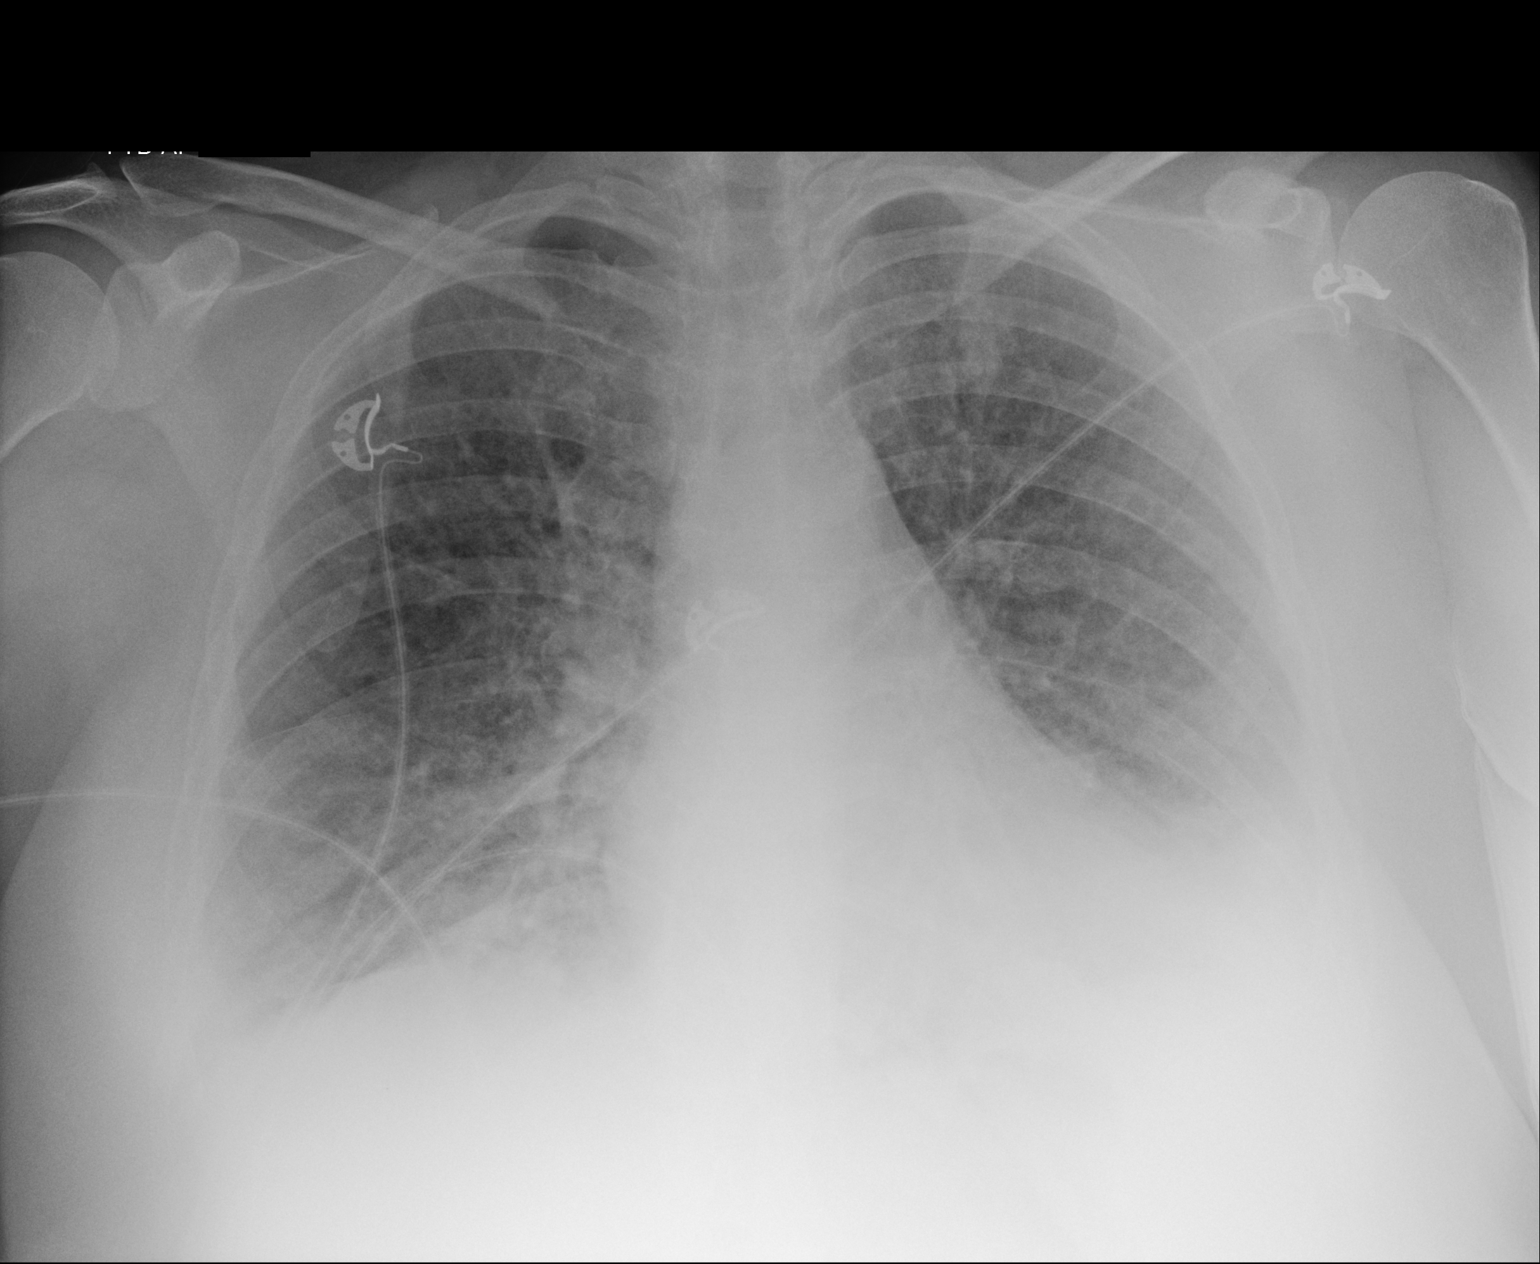

[1 of 1 positions shown; findings below may reference images not displayed]

FINDINGS: Diffuse interstitial coarsening with indistinct opacities at the
bases symmetrically. Borderline improvement at the right base.
Borderline heart size. No visible effusion or pneumothorax.
IMPRESSION: Borderline improvement from yesterday at the right base. CHF and/or
bilateral pneumonia.

## 2019-06-03 IMAGING — DX DG CHEST 2V
2 series · 2 of 2 positions shown · non-contrast
Comparison: 09/27/2017

CLINICAL DATA: Chronic bronchitis and asthma, history of vaping

EXAM:
CHEST - 2 VIEW

[chest pa]
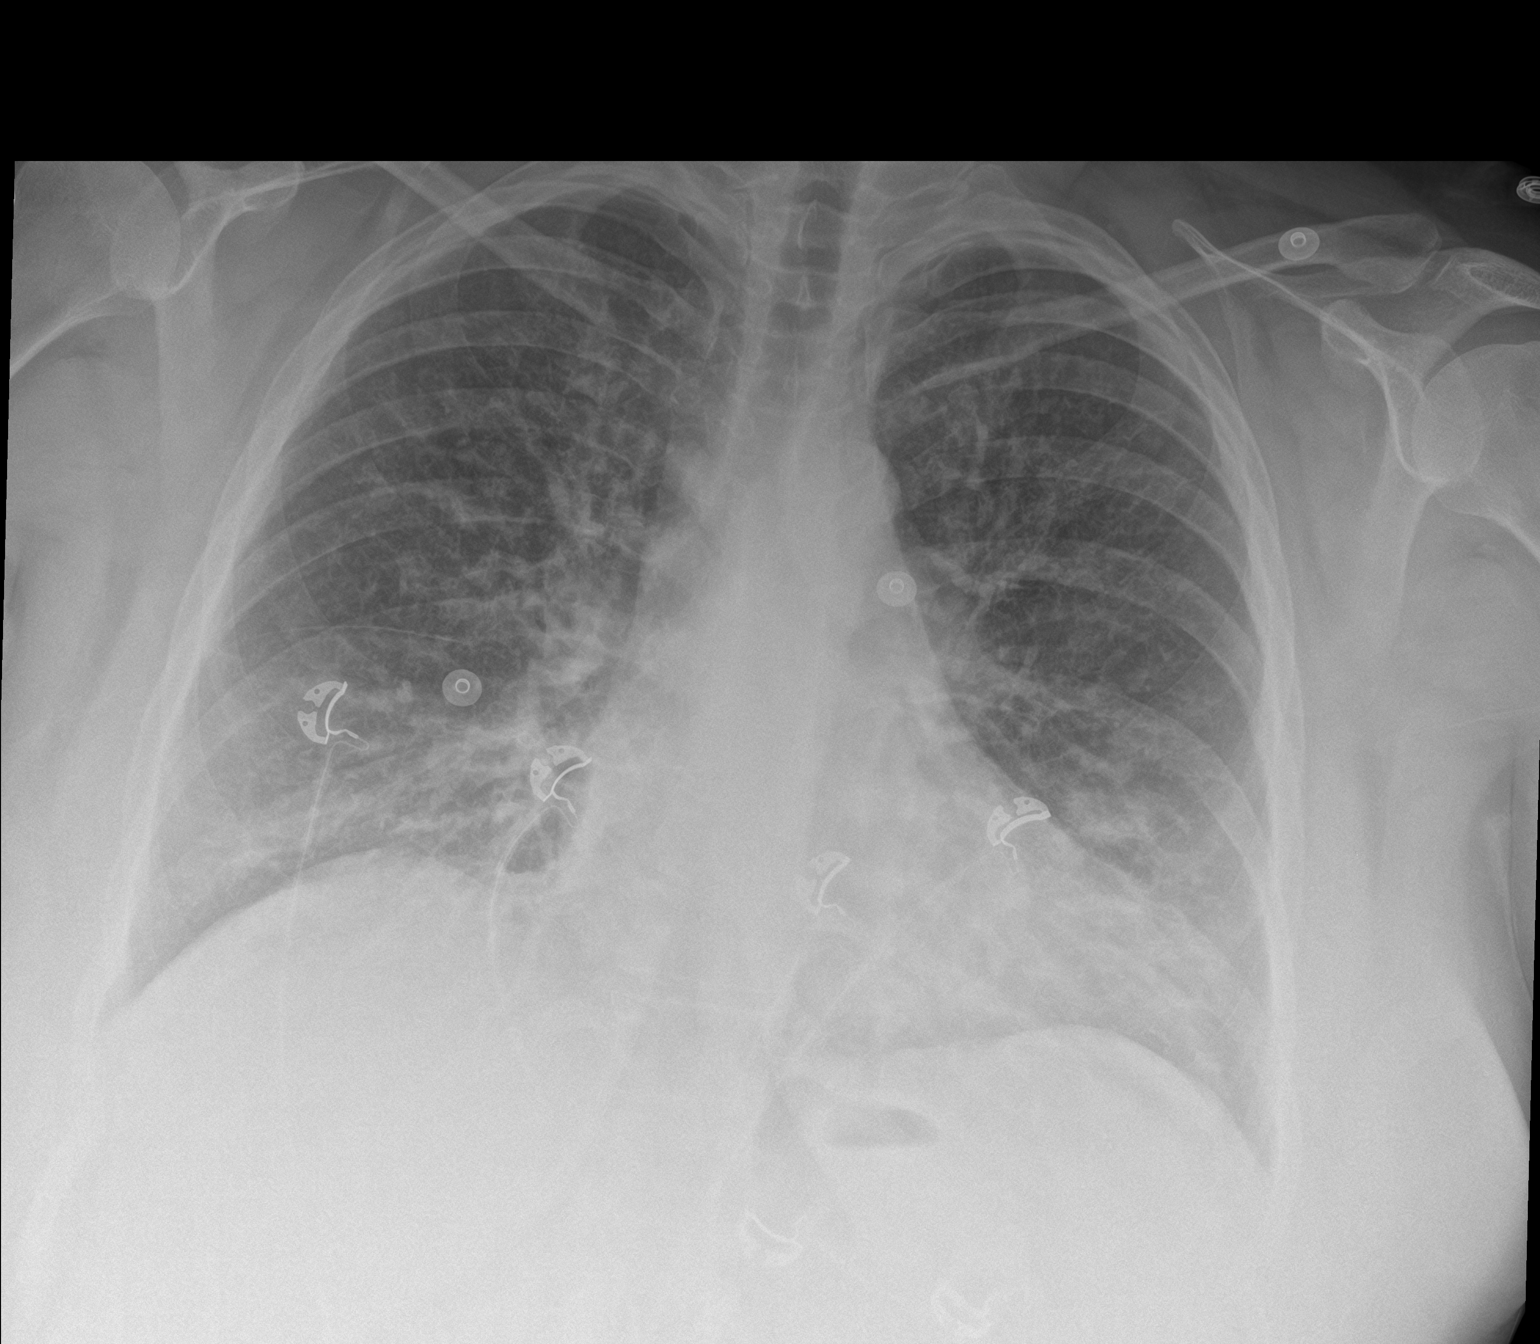

[chest lat]
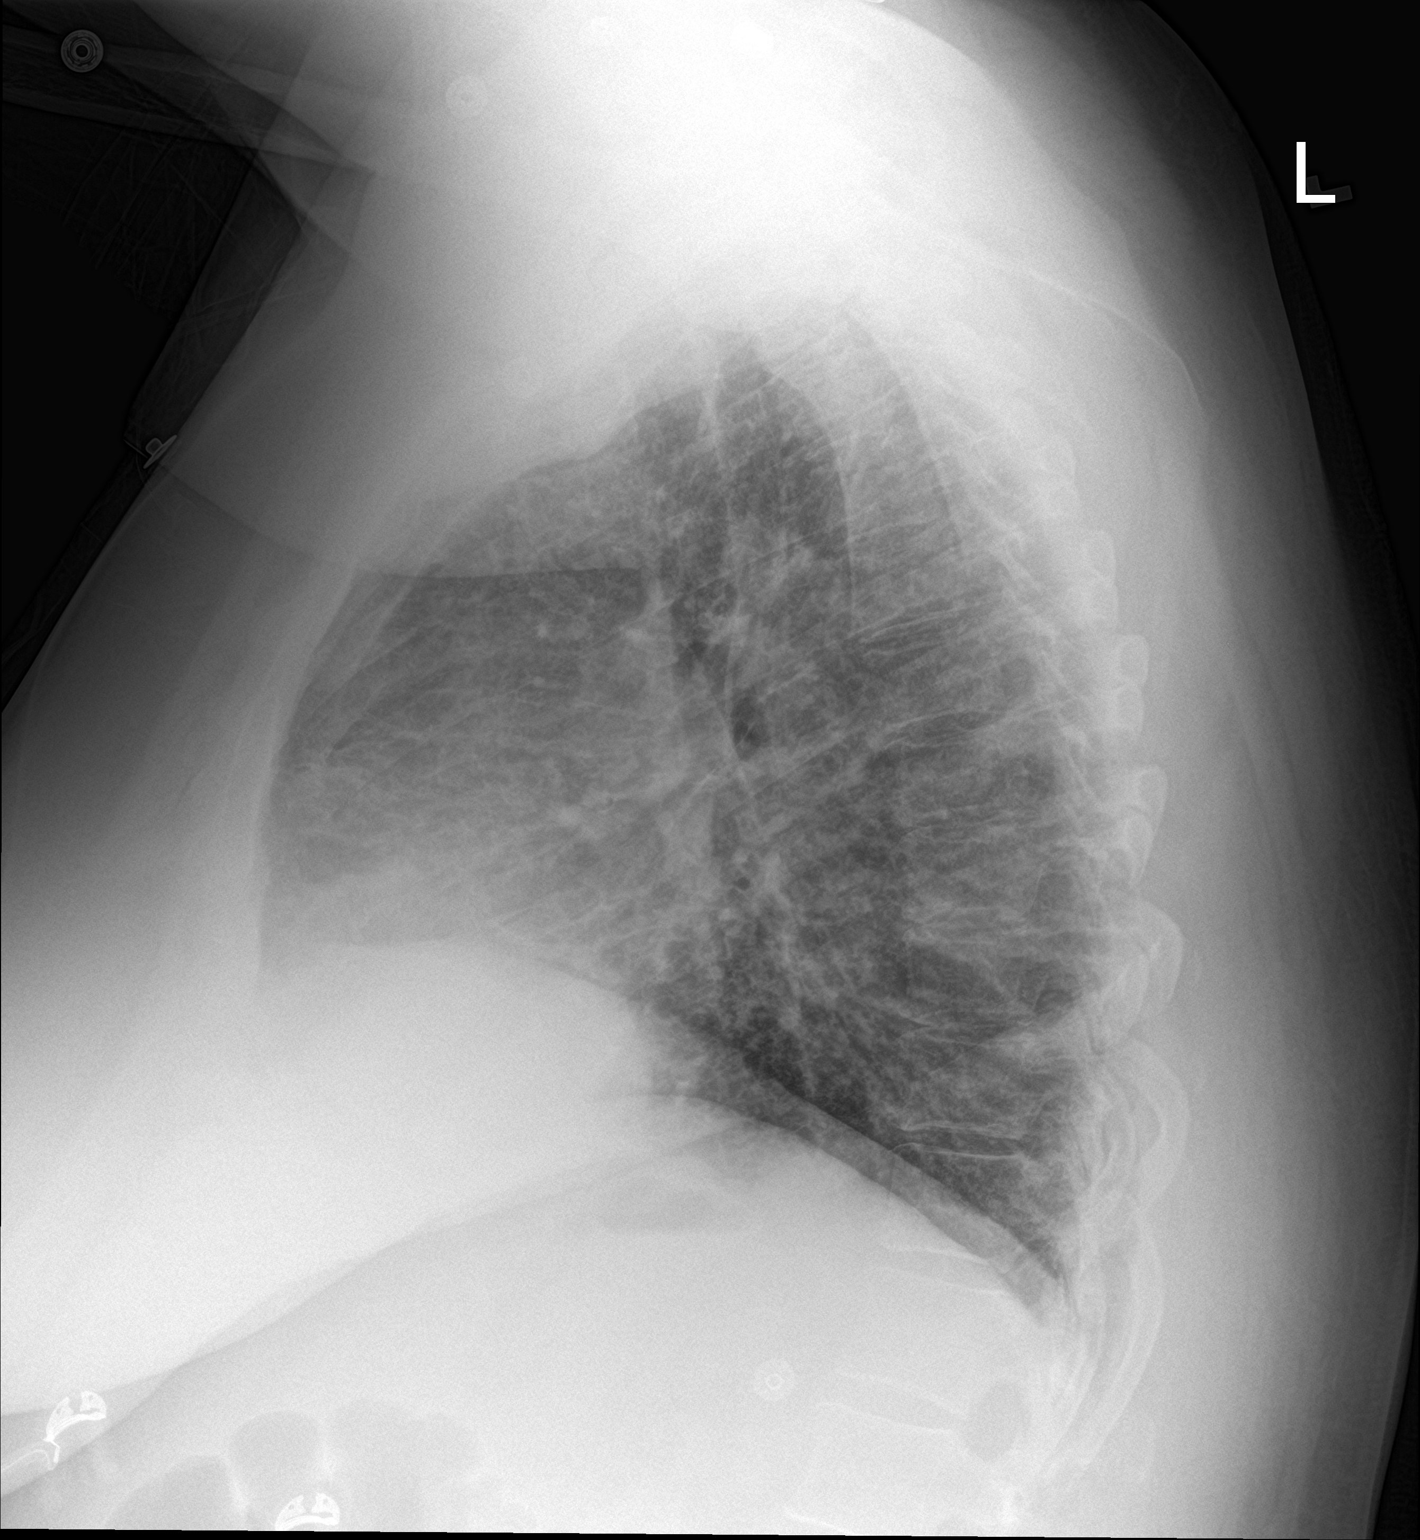

[2 of 2 positions shown; findings below may reference images not displayed]

FINDINGS: Cardiac shadow is stable. Lungs are well aerated bilaterally. Patchy
interstitial infiltrates are noted bilaterally particularly in the
lung bases. No sizable effusion is seen. No pneumothorax is noted.
No bony abnormality is noted.
IMPRESSION: Diffuse interstitial infiltrates bilaterally primarily within the
bases. Short-term follow-up is recommended following appropriate
therapy.
